# Patient Record
Sex: Male | Born: 1970 | Race: White | Hispanic: No | Marital: Single | Smoking: Current every day smoker
Health system: Southern US, Community
[De-identification: ages and names within clinical notes are randomized; demographics above are authoritative.]

## PROBLEM LIST (undated history)

## (undated) DIAGNOSIS — K759 Inflammatory liver disease, unspecified: Secondary | ICD-10-CM

## (undated) DIAGNOSIS — I82409 Acute embolism and thrombosis of unspecified deep veins of unspecified lower extremity: Secondary | ICD-10-CM

## (undated) DIAGNOSIS — I2699 Other pulmonary embolism without acute cor pulmonale: Secondary | ICD-10-CM

## (undated) DIAGNOSIS — F1020 Alcohol dependence, uncomplicated: Secondary | ICD-10-CM

## (undated) DIAGNOSIS — K7011 Alcoholic hepatitis with ascites: Secondary | ICD-10-CM

## (undated) HISTORY — PX: ANKLE SURGERY: SHX546

## (undated) HISTORY — PX: PARACENTESIS: SHX844

---

## 1997-11-24 ENCOUNTER — Emergency Department (HOSPITAL_COMMUNITY): Admission: EM | Admit: 1997-11-24 | Discharge: 1997-11-24 | Payer: Self-pay | Admitting: Emergency Medicine

## 2003-03-16 ENCOUNTER — Ambulatory Visit (HOSPITAL_BASED_OUTPATIENT_CLINIC_OR_DEPARTMENT_OTHER): Admission: RE | Admit: 2003-03-16 | Discharge: 2003-03-16 | Payer: Self-pay | Admitting: Orthopedic Surgery

## 2007-12-26 ENCOUNTER — Emergency Department (HOSPITAL_COMMUNITY): Admission: EM | Admit: 2007-12-26 | Discharge: 2007-12-26 | Payer: Self-pay | Admitting: Emergency Medicine

## 2010-08-21 NOTE — Op Note (Signed)
NAMEALFONSO, CARDEN               ACCOUNT NO.:  [id number]   MEDICAL RECORD NO.:  [id number]          PATIENT TYPE:  EMS   LOCATION:  MAJO                         FACILITY:  [facility]   PHYSICIAN:  Johnette Abraham, MD    DATE OF BIRTH:  [birth year]   DATE OF PROCEDURE:  12/26/2007  DATE OF DISCHARGE:  12/26/2007                               OPERATIVE REPORT   PREOPERATIVE DIAGNOSIS:  Partial amputation of the right ring finger.   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   PROCEDURE:  Local advancement flap of the right ring finger partial  amputation.   ANESTHESIA:  Local with IV Dilaudid.   COMPLICATIONS:  No acute complications.   INDICATIONS:  Mr. Marietta is a 40 year old gentleman who had a bathtub  fall on his finger sustaining a volar oblique distal amputation.  Risks,  benefits, and alternatives of the surgery were discussed with the  patient and the patient agreed to proceed.   PROCEDURE:  The hand was prepped and draped in normal sterile fashion.  An intrathecal block was used with 1% lidocaine without epinephrine for  a total of 2 mL anesthetizing the tip of the finger.  Irrigation and  debridement of the wound itself was performed.  Skin and subcutaneous  tissue and bone were debrided.  There was a small amount of distal tuft  that was exposed.  Excision of part of the nail plate was performed as  well as some of the distal bone.  Following, the volar tissue was  debulked and the skin was advanced over the bone suturing it to the nail  and adjacent skin.  This was performed with interrupted 5-0 chromic  sutures nicely covering the bone.  Hemostasis was controlled with direct  pressure. Afterwards, the wound was dressed with antibiotic ointment,  Vaseline gauze, and a sterile dressing.  The patient tolerated the  procedure well and was given postoperative instructions about wound care  and followup.      Johnette Abraham, MD  Electronically Signed     HCC/MEDQ  D:  12/26/2007  T:  12/27/2007  Job:  [id number]

## 2010-08-21 NOTE — Consult Note (Signed)
Antonio [name], Antonio [name]               ACCOUNT NO.:  [id number]   MEDICAL RECORD NO.:  [id number]          PATIENT TYPE:  EMS   LOCATION:  MAJO                         FACILITY:  [facility]   PHYSICIAN:  Johnette Abraham, MD    DATE OF BIRTH:  [birth year]   DATE OF CONSULTATION:  12/26/2007  DATE OF DISCHARGE:                                 CONSULTATION   REASON FOR CONSULTATION:  Partial amputation of the right ring finger.   HISTORY OF PRESENT ILLNESS:  Mr. Bechard is a 40 year old gentleman who  was working on a bathtub and the bathtub fell onto to his right ring  finger amputating the distal portion of it.  The patient complained of  pain and bleeding and presented to the ED.  The emergency department  evaluated the patient and I was consulted.   PAST MEDICAL HISTORY:  He denies any medical problems.   Currently, no medications.   Denies any allergies.   Past surgical history -  surgery on his right ankle.   His review of systems is essentially negative.   His social history is positive for ethanol and tobacco.  Denies drug  use.   Family history is noncontributory.   REVIEW OF SYSTEMS:  The patient denies all systems with the exception of  his surgery on his ankle and is current injury.   PHYSICAL EXAMINATION:  VITAL SIGNS: His vitals are reviewed.  GENERAL: He is alert and oriented x3.  RESPIRATORY:  He is in no acute distress.  CARDIOVASCULAR:  His heart rate is regular.  EXTREMITIES: Examination of his right upper extremity, has shoulder  elbow; wrist and other fingers are all within normal limits.  He has a  distal amputation of the right ring finger.  It is a volar oblique  laceration.  It extends from the tip of the nail dorsally and involves  approximally 1-cm proximal of the volar aspect out of his ring finger.  The distal phalanx is exposed.   X-ray examinations reveal a tuft fracture or partial amputation of the  tuft.   PLAN:  The patient has counseled that  there needs to be a soft tissue  covering the bone and that this needs to be performed.  Risks, benefits  and alternatives of the surgery were discussed with the patient.  The  patient agreed to proceed.  The finger will be anesthetized here in the  emergency department.  He is given Dilaudid IV for pain.  He is given IV  antibiotics and a local advancement flap will be performed here.      Johnette Abraham, MD  Electronically Signed     HCC/MEDQ  D:  12/26/2007  T:  12/27/2007  Job:  [id number]

## 2010-08-24 NOTE — Op Note (Signed)
NAMEHERRON, FERO                           ACCOUNT NO.:  [id number]   MEDICAL RECORD NO.:  [id number]                   PATIENT TYPE:  AMB   LOCATION:  DSC                                  FACILITY:  [facility]   PHYSICIAN:  Harvie Junior, M.D.                DATE OF BIRTH:  06/10/1970   DATE OF PROCEDURE:  03/16/2003  DATE OF DISCHARGE:  03/16/2003                                 OPERATIVE REPORT   PREOPERATIVE DIAGNOSIS:  Painful left ankle with degenerative changes in the  ankle and retained painful lateral and medial hardware.   POSTOPERATIVE DIAGNOSIS:  Painful left ankle with degenerative changes in  the ankle and retained painful lateral and medial hardware.   OPERATION PERFORMED:  1. Ankle arthroscopy with debridement of intra-articular degenerative     change.  2. Removal of medial and lateral hardware through separate incisions on the     medial and lateral aspect of the ankle.   SURGEON:  Harvie Junior, M.D.   ASSISTANT:  Marshia Ly, P.A.   ANESTHESIA:  General.   INDICATIONS FOR PROCEDURE:  He is a 40 year old male with a long history of  having had a bimalleolar ankle fracture. He was ultimately treated elsewhere  but continued to have pain and was unable to work.  He was sent up by  vocational rehabilitation for evaluation and treatment.  Injection of the  ankle was performed at that time.  That relieved about 75% of his pain.  It  was our concern at that point that the majority of the pain was coming from  degenerative ankle as opposed to painful hardware.  It was still thought  important to remove hardware given that he still had about 25% of his pain  after ankle injection.  He was brought to the operating room for these  separate procedures.   DESCRIPTION OF PROCEDURE:  The patient was taken to the operating room and  after adequate anesthesia was obtained with general anesthetic, the patient  was placed supine on the operating table.  The left ankle  was prepped and  draped in the usual sterile fashion.  Following this, the leg was  exsanguinated and a blood pressure tourniquet was inflated to 300 mmHg.  Following this, attention was turned to the left ankle where two portals  were established and the camera was placed into the left ankle.  The  degenerative changes were identified on the medial and lateral aspect of the  ankle in particular on the lateral side where there was a fairly significant  osteochondral defect.  This was debrided and drilled with a K-wire to  stimulate healing and vascularity in the bed of the injury.  At this point  attention was turned medially where there was some scar tissue on the medial  thigh.  This was debrided.  Following this, the ankle was copiously  irrigated and suctioned dry.  The portals were closed with interrupted  stitches.  Attention at this time was turned to the lateral aspect of the  ankle where incision was made.  Subcutaneous tissues were dissected down to  the level of the retained hardware and the lateral plate.  The plate was  stripped of all soft tissue attachment  and the plate was then removed,  multiple screws and the plate.  At this point the wound was copiously  irrigated and suctioned dry, closed in layers.  Attention was then turned  medially where the patient had retained medial hardware.  The incision was  made and screws were found.  A screwdriver was used.  The initial screw was  removed without incident.  The second screw was somewhat attached to bone,  did not have reverse cutting threads and in attempted to twist this, it  twisted the head of the screw off.  Happily it was below the level of the  bone and there was no need to get a trephine and take this out as it could  not be causing pain below the level of the bone.  At this point the medial  wound was irrigated, suctioned dry and closed in layers.  A sterile  compressive dressing was applied as well as a U and  posterior splint.  The  patient was then transferred to the recovery room where he was noted to be  in satisfactory condition.  The estimated blood loss for this procedure was  none.                                               Harvie Junior, M.D.    Ranae Plumber  D:  04/28/2003  T:  04/28/2003  Job:  [id number]

## 2011-05-10 DIAGNOSIS — IMO0001 Reserved for inherently not codable concepts without codable children: Secondary | ICD-10-CM

## 2011-05-10 DIAGNOSIS — I2699 Other pulmonary embolism without acute cor pulmonale: Secondary | ICD-10-CM

## 2011-05-10 DIAGNOSIS — I82409 Acute embolism and thrombosis of unspecified deep veins of unspecified lower extremity: Secondary | ICD-10-CM

## 2011-05-10 HISTORY — DX: Reserved for inherently not codable concepts without codable children: IMO0001

## 2011-05-10 HISTORY — DX: Acute embolism and thrombosis of unspecified deep veins of unspecified lower extremity: I82.409

## 2011-05-10 HISTORY — DX: Other pulmonary embolism without acute cor pulmonale: I26.99

## 2011-06-07 HISTORY — PX: LIVER BIOPSY: SHX301

## 2011-06-18 HISTORY — PX: ESOPHAGOGASTRODUODENOSCOPY: SHX1529

## 2011-06-21 ENCOUNTER — Emergency Department (HOSPITAL_COMMUNITY): Payer: Medicaid Other

## 2011-06-21 ENCOUNTER — Inpatient Hospital Stay (HOSPITAL_COMMUNITY)
Admission: EM | Admit: 2011-06-21 | Discharge: 2011-07-01 | DRG: 371 | Disposition: A | Discharge status: Home Health | Payer: Medicaid Other | Attending: Internal Medicine | Admitting: Internal Medicine

## 2011-06-21 ENCOUNTER — Other Ambulatory Visit: Payer: Self-pay

## 2011-06-21 ENCOUNTER — Encounter (HOSPITAL_COMMUNITY): Payer: Self-pay

## 2011-06-21 DIAGNOSIS — K5900 Constipation, unspecified: Secondary | ICD-10-CM | POA: Diagnosis not present

## 2011-06-21 DIAGNOSIS — D649 Anemia, unspecified: Secondary | ICD-10-CM | POA: Diagnosis present

## 2011-06-21 DIAGNOSIS — K219 Gastro-esophageal reflux disease without esophagitis: Secondary | ICD-10-CM | POA: Diagnosis present

## 2011-06-21 DIAGNOSIS — D6859 Other primary thrombophilia: Secondary | ICD-10-CM | POA: Diagnosis present

## 2011-06-21 DIAGNOSIS — E871 Hypo-osmolality and hyponatremia: Secondary | ICD-10-CM | POA: Diagnosis present

## 2011-06-21 DIAGNOSIS — I959 Hypotension, unspecified: Secondary | ICD-10-CM | POA: Diagnosis present

## 2011-06-21 DIAGNOSIS — R6251 Failure to thrive (child): Secondary | ICD-10-CM | POA: Diagnosis present

## 2011-06-21 DIAGNOSIS — IMO0002 Reserved for concepts with insufficient information to code with codable children: Secondary | ICD-10-CM

## 2011-06-21 DIAGNOSIS — K7460 Unspecified cirrhosis of liver: Secondary | ICD-10-CM | POA: Insufficient documentation

## 2011-06-21 DIAGNOSIS — Z86711 Personal history of pulmonary embolism: Secondary | ICD-10-CM

## 2011-06-21 DIAGNOSIS — F17200 Nicotine dependence, unspecified, uncomplicated: Secondary | ICD-10-CM | POA: Diagnosis present

## 2011-06-21 DIAGNOSIS — K652 Spontaneous bacterial peritonitis: Principal | ICD-10-CM | POA: Diagnosis present

## 2011-06-21 DIAGNOSIS — F1020 Alcohol dependence, uncomplicated: Secondary | ICD-10-CM | POA: Diagnosis present

## 2011-06-21 DIAGNOSIS — E43 Unspecified severe protein-calorie malnutrition: Secondary | ICD-10-CM | POA: Diagnosis present

## 2011-06-21 DIAGNOSIS — R188 Other ascites: Secondary | ICD-10-CM | POA: Diagnosis present

## 2011-06-21 DIAGNOSIS — D689 Coagulation defect, unspecified: Secondary | ICD-10-CM | POA: Diagnosis present

## 2011-06-21 DIAGNOSIS — R64 Cachexia: Secondary | ICD-10-CM | POA: Diagnosis present

## 2011-06-21 DIAGNOSIS — R000 Tachycardia, unspecified: Secondary | ICD-10-CM | POA: Diagnosis present

## 2011-06-21 DIAGNOSIS — R627 Adult failure to thrive: Secondary | ICD-10-CM | POA: Diagnosis present

## 2011-06-21 DIAGNOSIS — Z86718 Personal history of other venous thrombosis and embolism: Secondary | ICD-10-CM

## 2011-06-21 DIAGNOSIS — Z79899 Other long term (current) drug therapy: Secondary | ICD-10-CM

## 2011-06-21 HISTORY — DX: Acute embolism and thrombosis of unspecified deep veins of unspecified lower extremity: I82.409

## 2011-06-21 HISTORY — DX: Other pulmonary embolism without acute cor pulmonale: I26.99

## 2011-06-21 HISTORY — DX: Alcohol dependence, uncomplicated: F10.20

## 2011-06-21 HISTORY — DX: Alcoholic hepatitis with ascites: K70.11

## 2011-06-21 LAB — DIFFERENTIAL
Lymphocytes Relative: 11 % — ABNORMAL LOW (ref 12–46)
Lymphs Abs: 1.7 10*3/uL (ref 0.7–4.0)
Neutro Abs: 11.7 10*3/uL — ABNORMAL HIGH (ref 1.7–7.7)
Neutrophils Relative %: 78 % — ABNORMAL HIGH (ref 43–77)

## 2011-06-21 LAB — COMPREHENSIVE METABOLIC PANEL
BUN: 13 mg/dL (ref 6–23)
CO2: 25 mEq/L (ref 19–32)
Calcium: 8.9 mg/dL (ref 8.4–10.5)
Chloride: 94 mEq/L — ABNORMAL LOW (ref 96–112)
Creatinine, Ser: 0.68 mg/dL (ref 0.50–1.35)
GFR calc non Af Amer: >90 mL/min (ref >90)
Total Bilirubin: 0.4 mg/dL (ref 0.3–1.2)

## 2011-06-21 LAB — CBC
Platelets: 320 10*3/uL (ref 150–400)
RBC: 4.38 MIL/uL (ref 4.22–5.81)
WBC: 15 10*3/uL — ABNORMAL HIGH (ref 4.0–10.5)

## 2011-06-21 LAB — URINALYSIS, ROUTINE W REFLEX MICROSCOPIC
Hgb urine dipstick: NEGATIVE
Protein, ur: 30 mg/dL — AB
Urobilinogen, UA: 1 mg/dL (ref 0.0–1.0)

## 2011-06-21 LAB — APTT: aPTT: 40 seconds — ABNORMAL HIGH (ref 24–37)

## 2011-06-21 LAB — LACTIC ACID, PLASMA: Lactic Acid, Venous: 1.6 mmol/L (ref 0.5–2.2)

## 2011-06-21 LAB — URINE MICROSCOPIC-ADD ON

## 2011-06-21 LAB — PROTIME-INR
INR: 1.92 — ABNORMAL HIGH (ref 0.00–1.49)
Prothrombin Time: 22.3 seconds — ABNORMAL HIGH (ref 11.6–15.2)

## 2011-06-21 MED ORDER — FENTANYL CITRATE 0.05 MG/ML IJ SOLN
100.0000 ug | Freq: Once | INTRAMUSCULAR | Status: AC
  Administered 2011-06-21: 100 ug via INTRAVENOUS
  Filled 2011-06-21: qty 2

## 2011-06-21 MED ORDER — SODIUM CHLORIDE 0.9 % IV BOLUS (SEPSIS)
1000.0000 mL | Freq: Once | INTRAVENOUS | Status: AC
  Administered 2011-06-21: 1000 mL via INTRAVENOUS

## 2011-06-21 NOTE — ED Notes (Signed)
Patient presents with generalized weakness, fatigue, SOB, rapid heart rate and chest pain to epigastric area.  Abdominal distention present.

## 2011-06-21 NOTE — ED Notes (Signed)
Antonio [name] brother emergency contact [phone]  [name] sister emergency contact [phone]

## 2011-06-21 NOTE — ED Provider Notes (Signed)
History     CSN: [id number]  Arrival date & time 06/21/11  1807   First MD Initiated Contact with Patient 06/21/11 1821      Chief Complaint  Patient presents with  . Fatigue  . Tachycardia    (Consider location/radiation/quality/duration/timing/severity/associated sxs/prior treatment) HPI Patient presents with generalized weakness, fatigue, SOB, rapid heart rate and chest pain to epigastric area. Abdominal distention present.  Patient recently discharge from hospital [place].  Patient had liver biopsy and was told that he had fluid leaking in his abdomen.  Patient was a former heavy drinker stopped about 12 weeks ago.  Patient's had a 40 pound weight loss over that time.  Patient denies fever chills.  History reviewed. No pertinent past medical history.  Past Surgical History  Procedure Date  . Ankle surgery     History reviewed. No pertinent family history.  History  Substance Use Topics  . Smoking status: Current Everyday Smoker  . Smokeless tobacco: Not on file  . Alcohol Use: No      Review of Systems Review of systems unremarkable except as noted in history of present illness Allergies  Review of patient's allergies indicates no known allergies.  Home Medications   No current outpatient prescriptions on file.  BP 118/89  Pulse 107  Temp(Src) 97.9 F (36.6 C) (Oral)  Resp 16  Ht 6\' 1"  (1.854 m)  Wt 141 lb 1.5 oz (64 kg)  BMI 18.62 kg/m2  SpO2 97%  Physical Exam  Nursing note and vitals reviewed. Constitutional: He is oriented to person, place, and time. He appears cachectic. He appears ill. No distress.  HENT:  Head: Normocephalic and atraumatic.  Eyes: Pupils are equal, round, and reactive to light.  Neck: Normal range of motion.  Cardiovascular: Intact distal pulses.  Tachycardia present.   No murmur heard.       Date: 06/21/2011  Rate: 155  Rhythm: Sinus tachycardia  QRS Axis: normal  Intervals: normal  ST/T Wave abnormalities: normal  Conduction Disutrbances: none  Narrative Interpretation: Sinus tachycardia otherwise unremarkable      Pulmonary/Chest: No respiratory distress. He has no wheezes. He has no rales.  Abdominal: Normal appearance. He exhibits shifting dullness, distension, fluid wave and ascites.       Ascites noted with bedside ultrasound  Musculoskeletal: Normal range of motion.  Neurological: He is alert and oriented to person, place, and time. No cranial nerve deficit.  Skin: Skin is warm and dry. No rash noted.  Psychiatric: He has a normal mood and affect. His behavior is normal.    ED Course  Procedures (including critical care time)  Labs Reviewed  COMPREHENSIVE METABOLIC PANEL - Abnormal; Notable for the following:    Sodium 131 (*)    Chloride 94 (*)    Glucose, Bld 172 (*)    Albumin 2.1 (*)    Alkaline Phosphatase 197 (*)    All other components within normal limits  CBC - Abnormal; Notable for the following:    WBC 15.0 (*)    All other components within normal limits  DIFFERENTIAL - Abnormal; Notable for the following:    Neutrophils Relative 78 (*)    Neutro Abs 11.7 (*)    Lymphocytes Relative 11 (*)    Monocytes Absolute 1.5 (*)    All other components within normal limits  PROTIME-INR - Abnormal; Notable for the following:    Prothrombin Time 22.3 (*)    INR 1.92 (*)    All other components within normal  limits  URINALYSIS, ROUTINE W REFLEX MICROSCOPIC - Abnormal; Notable for the following:    Color, Urine AMBER (*) BIOCHEMICALS MAY BE AFFECTED BY COLOR   APPearance CLOUDY (*)    Specific Gravity, Urine 1.034 (*)    Bilirubin Urine SMALL (*)    Ketones, ur 15 (*)    Protein, ur 30 (*)    Leukocytes, UA TRACE (*)    All other components within normal limits  APTT - Abnormal; Notable for the following:    aPTT 40 (*)    All other components within normal limits  BASIC METABOLIC PANEL - Abnormal; Notable for the following:    Sodium 134 (*)    Glucose, Bld 123 (*)     Calcium 8.3 (*)    All other components within normal limits  CBC - Abnormal; Notable for the following:    RBC 3.66 (*)    Hemoglobin 11.3 (*) DELTA CHECK NOTED   HCT 34.0 (*)    All other components within normal limits  LACTIC ACID, PLASMA  AMMONIA  URINE MICROSCOPIC-ADD ON   Dg Chest Portable 1 View  06/21/2011  *RADIOLOGY REPORT*  Clinical Data: Fatigue, tachycardia, cough  PORTABLE CHEST - 1 VIEW  Comparison: 06/03/2011  Findings: Cardiomediastinal silhouette is stable.  Study is limited by poor inspiration.  Mild right basilar atelectasis. Improvement in aeration in the right lower lobe with decreasing size of the right lateral focal infiltrate measures about 1.2 cm.  Follow-up examination to assure complete resolution is recommended.  No new infiltrate or pulmonary edema.  IMPRESSION:  Study is limited by poor inspiration.  Mild right basilar atelectasis. Improvement in aeration in the right lower lobe with decreasing size of the right lateral focal infiltrate measures about 1.2 cm.  Follow-up examination to assure complete resolution is recommended.  No new infiltrate or pulmonary edema.  Original Report Authenticated By: Natasha Mead, M.D.     1. Cirrhosis   2. Ascites    Scheduled Meds:   . fentaNYL  100 mcg Intravenous Once  . fentaNYL  100 mcg Intravenous Once  . sodium chloride  1,000 mL Intravenous Once  . sodium chloride  3 mL Intravenous Q12H   Continuous Infusions:   . sodium chloride 75 mL/hr at 06/22/11 0200   PRN Meds:.acetaminophen, acetaminophen, alum & mag hydroxide-simeth, HYDROmorphone, ondansetron (ZOFRAN) IV, ondansetron, oxyCODONE, zolpidem    MDM  tachycardia improving with fluids  Will plan on admitting Spoke to the hospitalist about obtaining med records from [facility] Pai, MD 06/22/11 1015

## 2011-06-21 NOTE — ED Notes (Signed)
Pt appears very emaciated and frail.

## 2011-06-22 DIAGNOSIS — R6251 Failure to thrive (child): Secondary | ICD-10-CM | POA: Diagnosis present

## 2011-06-22 DIAGNOSIS — R188 Other ascites: Secondary | ICD-10-CM | POA: Diagnosis present

## 2011-06-22 LAB — CBC
MCH: 30.9 pg (ref 26.0–34.0)
MCV: 92.9 fL (ref 78.0–100.0)
Platelets: 263 10*3/uL (ref 150–400)
RDW: 12.8 % (ref 11.5–15.5)
WBC: 10.3 10*3/uL (ref 4.0–10.5)

## 2011-06-22 LAB — BASIC METABOLIC PANEL
CO2: 28 mEq/L (ref 19–32)
Calcium: 8.3 mg/dL — ABNORMAL LOW (ref 8.4–10.5)
Creatinine, Ser: 0.6 mg/dL (ref 0.50–1.35)

## 2011-06-22 MED ORDER — RIVAROXABAN 15 MG PO TABS: 15.0000 mg | ORAL_TABLET | Freq: Every day | ORAL | Status: DC

## 2011-06-22 MED ORDER — ACETAMINOPHEN 650 MG RE SUPP: 650.0000 mg | Freq: Four times a day (QID) | RECTAL | Status: DC | PRN

## 2011-06-22 MED ORDER — ONDANSETRON HCL 4 MG PO TABS: 4.0000 mg | ORAL_TABLET | Freq: Four times a day (QID) | ORAL | Status: DC | PRN

## 2011-06-22 MED ORDER — OXYCODONE HCL 5 MG PO TABS
5.0000 mg | ORAL_TABLET | ORAL | Status: DC | PRN
  Administered 2011-06-22: 5 mg via ORAL
  Filled 2011-06-22: qty 1

## 2011-06-22 MED ORDER — OXYCODONE HCL 5 MG PO TABS
10.0000 mg | ORAL_TABLET | ORAL | Status: DC | PRN
  Administered 2011-06-22 – 2011-07-01 (×40): 10 mg via ORAL
  Filled 2011-06-22 (×41): qty 2

## 2011-06-22 MED ORDER — DIPHENHYDRAMINE HCL 12.5 MG/5ML PO ELIX
12.5000 mg | ORAL_SOLUTION | Freq: Three times a day (TID) | ORAL | Status: DC | PRN
  Filled 2011-06-22: qty 5

## 2011-06-22 MED ORDER — ENSURE CLINICAL ST REVIGOR PO LIQD
237.0000 mL | Freq: Three times a day (TID) | ORAL | Status: DC
  Administered 2011-06-22 – 2011-06-24 (×3): 237 mL via ORAL

## 2011-06-22 MED ORDER — FOLIC ACID 1 MG PO TABS
1.0000 mg | ORAL_TABLET | Freq: Every day | ORAL | Status: DC
  Administered 2011-06-22 – 2011-07-01 (×10): 1 mg via ORAL
  Filled 2011-06-22 (×10): qty 1

## 2011-06-22 MED ORDER — VITAMIN B-1 100 MG PO TABS
100.0000 mg | ORAL_TABLET | Freq: Every day | ORAL | Status: DC
  Administered 2011-06-22 – 2011-07-01 (×10): 100 mg via ORAL
  Filled 2011-06-22 (×10): qty 1

## 2011-06-22 MED ORDER — PROPRANOLOL HCL 20 MG PO TABS
20.0000 mg | ORAL_TABLET | Freq: Two times a day (BID) | ORAL | Status: DC
  Administered 2011-06-22 – 2011-06-26 (×9): 20 mg via ORAL
  Filled 2011-06-22 (×10): qty 1

## 2011-06-22 MED ORDER — PANTOPRAZOLE SODIUM 40 MG PO TBEC
40.0000 mg | DELAYED_RELEASE_TABLET | Freq: Every day | ORAL | Status: DC
  Administered 2011-06-22 – 2011-06-25 (×4): 40 mg via ORAL
  Filled 2011-06-22 (×5): qty 1

## 2011-06-22 MED ORDER — SODIUM CHLORIDE 0.9 % IJ SOLN
3.0000 mL | Freq: Two times a day (BID) | INTRAMUSCULAR | Status: DC
  Administered 2011-06-22 – 2011-07-01 (×15): 3 mL via INTRAVENOUS

## 2011-06-22 MED ORDER — ALBUMIN HUMAN 25 % IV SOLN
50.0000 g | Freq: Four times a day (QID) | INTRAVENOUS | Status: AC
  Administered 2011-06-22 – 2011-06-23 (×3): 50 g via INTRAVENOUS
  Filled 2011-06-22 (×3): qty 200

## 2011-06-22 MED ORDER — ZOLPIDEM TARTRATE 5 MG PO TABS: 5.0000 mg | ORAL_TABLET | Freq: Every evening | ORAL | Status: DC | PRN

## 2011-06-22 MED ORDER — SODIUM CHLORIDE 0.9 % IV SOLN
INTRAVENOUS | Status: DC
  Administered 2011-06-22: 02:00:00 via INTRAVENOUS
  Administered 2011-06-23: 20 mL via INTRAVENOUS

## 2011-06-22 MED ORDER — HYDROMORPHONE HCL PF 1 MG/ML IJ SOLN
0.5000 mg | INTRAMUSCULAR | Status: DC | PRN
  Administered 2011-06-22 (×4): 1 mg via INTRAVENOUS
  Filled 2011-06-22 (×4): qty 1

## 2011-06-22 MED ORDER — ACETAMINOPHEN 325 MG PO TABS: 650.0000 mg | ORAL_TABLET | Freq: Four times a day (QID) | ORAL | Status: DC | PRN

## 2011-06-22 MED ORDER — ONDANSETRON HCL 4 MG/2ML IJ SOLN
4.0000 mg | Freq: Four times a day (QID) | INTRAMUSCULAR | Status: DC | PRN
  Administered 2011-06-22 – 2011-06-27 (×13): 4 mg via INTRAVENOUS
  Filled 2011-06-22 (×14): qty 2

## 2011-06-22 MED ORDER — ALUM & MAG HYDROXIDE-SIMETH 200-200-20 MG/5ML PO SUSP
30.0000 mL | Freq: Four times a day (QID) | ORAL | Status: DC | PRN
  Administered 2011-06-30: 30 mL via ORAL
  Filled 2011-06-22: qty 30

## 2011-06-22 MED ORDER — ENOXAPARIN SODIUM 60 MG/0.6ML ~~LOC~~ SOLN
60.0000 mg | Freq: Two times a day (BID) | SUBCUTANEOUS | Status: DC
  Administered 2011-06-22 – 2011-06-30 (×15): 60 mg via SUBCUTANEOUS
  Filled 2011-06-22 (×20): qty 0.6

## 2011-06-22 NOTE — ED Notes (Signed)
Called to give report.  The patient's room is not ready.  Receiving RN to call back when room is ready.

## 2011-06-22 NOTE — Progress Notes (Signed)
ANTICOAGULATION CONSULT NOTE - Initial Consult  Pharmacy Consult for Lovenox Indication: recent bilateral PE  No Known Allergies  Patient Measurements: Height: 6\' 1"  (185.4 cm) Weight: 141 lb 1.5 oz (64 kg) IBW/kg (Calculated) : 79.9   Vital Signs: Temp: 98.2 F (36.8 C) (03/16 1500) Temp src: Oral (03/16 1500) BP: 122/97 mmHg (03/16 1500) Pulse Rate: 118  (03/16 1500)  Labs:  Basename 06/22/11 0500 06/21/11 1828  HGB 11.3* 13.7  HCT 34.0* 40.5  PLT 263 320  APTT -- 40*  LABPROT -- 22.3*  INR -- 1.92*  HEPARINUNFRC -- --  CREATININE 0.60 0.68  CKTOTAL -- --  CKMB -- --  TROPONINI -- --   Estimated Creatinine Clearance: 113.3 ml/min (by C-G formula based on Cr of 0.6).  Assessment:   Recent admission at [facility]. Records not yet available. Patient reports discharge on 3/14. Has Rx vial for Xarelto 15 mg BID x 21 days.  Last taken 3/15 am.  New bilateral PE  while at [facility]. Now to change to Lovenox.     Goal of Therapy:   full anticoagulation   Plan:    Will begin Lovenox 60 mg SQ q12hrs.   CBC every other day for now.  Dennie Fetters, [place] Pager:  [phone] 06/22/2011,3:53 PM

## 2011-06-22 NOTE — H&P (Addendum)
DATE OF ADMISSION:  06/22/2011  PCP:   No primary provider on file.   Chief Complaint: weakness   HPI: Antonio [name] is an 42 y.o. male with a long history of Alcohol abuse and recent hospitalization at [facility] for Ascites and Liver disease who presents to the [facility] ED for evaluation due to complaints of increased weakness and falls and for the past 2 days following his discharge from [facility].  He reports having 14 liters of fluid removed from his Abdomen, and having a liver biopsy.  But he is unaware of the conclusions.   He reports that he has a 40 pound unintentional weight loss over the past month.  He also reports having a poor appetite and having increased nausea and vomiting, he denies diarrhea, fevers or chills.  He also states that he stopped drinking alcohol 12 weeks ago.    In the ED he was found to have tachycardia, and mild hypotension.   His tachycardia and blood pressure did respond to fluid challenges.  He was referred for medical admission.   History reviewed. No pertinent past medical history.  Past Surgical History  Procedure Date  . Ankle surgery     Medications:  HOME MEDS: Prior to Admission medications   Medication Sig Start Date End Date Taking? Authorizing Provider  omeprazole (PRILOSEC) 20 MG capsule Take 20 mg by mouth 2 (two) times daily.   Yes Historical Provider, MD  oxyCODONE (OXY IR/ROXICODONE) 5 MG immediate release tablet Take 5 mg by mouth every 4 (four) hours as needed.   Yes Historical Provider, MD  prenatal vitamin w/FE, FA (PRENATAL 1 + 1) 27-1 MG TABS Take 1 tablet by mouth daily.   Yes Historical Provider, MD  promethazine (PHENERGAN) 12.5 MG tablet Take 12.5 mg by mouth every 6 (six) hours as needed. For nausea   Yes Historical Provider, MD  Rivaroxaban (XARELTO) 15 MG TABS tablet Take 15 mg by mouth daily.   Yes Historical Provider, MD  spironolactone (ALDACTONE) 25 MG tablet Take 25 mg by mouth 2 (two) times daily.   Yes  Historical Provider, MD    Allergies:  No Known Allergies  Social History:   reports that he has been smoking.  He does not have any smokeless tobacco history on file. He reports that he does not drink alcohol or use illicit drugs.  Family History: History reviewed. No pertinent family history.   Review of Systems:  Positive for anorexia, weight loss, dyspnea on exertion,  abdominal pain, unusual weight change, muscle weakness, and peripheral edema,  The patient denies fever, vision loss, decreased hearing, hoarseness, chest pain, syncope,   balance deficits, hemoptysis,melena, hematochezia, severe indigestion/heartburn, hematuria, incontinence, genital sores,  suspicious skin lesions, transient blindness, difficulty walking, depression, abnormal bleeding, enlarged lymph nodes, angioedema, and breast masses.   Physical Exam:  GEN:  Pleasant  Cachectic appearing 41 year old Caucasian male examined  and in no acute distress; cooperative with exam Filed Vitals:   06/22/11 0000 06/22/11 0145 06/22/11 0600 06/22/11 0800  BP: 119/95 125/96 110/83 118/89  Pulse: 120 122 111 107  Temp:  97.6 F (36.4 C) 97.5 F (36.4 C) 97.9 F (36.6 C)  TempSrc:    Oral  Resp: 21 20 14 16   Height:  6\' 1"  (1.854 m)    Weight:  64 kg (141 lb 1.5 oz)    SpO2: 97% 100% 99% 97%   Blood pressure 118/89, pulse 107, temperature 97.9 F (36.6 C), temperature source  Oral, resp. rate 16, height 6\' 1"  (1.854 m), weight 64 kg (141 lb 1.5 oz), SpO2 97.00%. PSYCH: He is alert and oriented x4; does not appear anxious does not appear depressed; affect is normal HEENT: Normocephalic and Atraumatic, Mucous membranes pink; PERRLA; EOM intact; Fundi:  Benign;  No scleral icterus, Nares: Patent, Oropharynx: POOR Dentition, Neck:  FROM, no cervical lymphadenopathy nor thyromegaly or carotid bruit; no JVD; Breasts:: Not examined CHEST WALL: No tenderness CHEST: Normal respiration, clear to auscultation bilaterally HEART:  Regular rate and rhythm; no murmurs rubs or gallops BACK: No kyphosis or scoliosis; no CVA tenderness ABDOMEN:  Mild CAPUT MEDUSA, Distant but Positive Bowel Sounds, Distended, firm non-tender; no masses, no organomegaly. Rectal Exam: Not done EXTREMITIES: No bone or joint deformity; age-appropriate arthropathy of the hands and knees; no cyanosis, clubbing or edema; no ulcerations. Genitalia: not examined PULSES: 2+ and symmetric SKIN: Normal hydration no rash or ulceration CNS: Cranial nerves 2-12 grossly intact no focal neurologic deficit   Labs & Imaging Results for orders placed during the hospital encounter of 06/21/11 (from the past 48 hour(s))  COMPREHENSIVE METABOLIC PANEL     Status: Abnormal   Collection Time   06/21/11  6:28 PM      Component Value Range Comment   Sodium 131 (*) 135 - 145 (mEq/L)    Potassium 4.5  3.5 - 5.1 (mEq/L)    Chloride 94 (*) 96 - 112 (mEq/L)    CO2 25  19 - 32 (mEq/L)    Glucose, Bld 172 (*) 70 - 99 (mg/dL)    BUN 13  6 - 23 (mg/dL)    Creatinine, Ser 1.61  0.50 - 1.35 (mg/dL)    Calcium 8.9  8.4 - 10.5 (mg/dL)    Total Protein 6.0  6.0 - 8.3 (g/dL)    Albumin 2.1 (*) 3.5 - 5.2 (g/dL)    AST 32  0 - 37 (U/L)    ALT 21  0 - 53 (U/L)    Alkaline Phosphatase 197 (*) 39 - 117 (U/L)    Total Bilirubin 0.4  0.3 - 1.2 (mg/dL)    GFR calc non Af Amer >90  >90 (mL/min)    GFR calc Af Amer >90  >90 (mL/min)   CBC     Status: Abnormal   Collection Time   06/21/11  6:28 PM      Component Value Range Comment   WBC 15.0 (*) 4.0 - 10.5 (K/uL)    RBC 4.38  4.22 - 5.81 (MIL/uL)    Hemoglobin 13.7  13.0 - 17.0 (g/dL)    HCT 09.6  04.5 - 40.9 (%)    MCV 92.5  78.0 - 100.0 (fL)    MCH 31.3  26.0 - 34.0 (pg)    MCHC 33.8  30.0 - 36.0 (g/dL)    RDW 81.1  91.4 - 78.2 (%)    Platelets 320  150 - 400 (K/uL)   DIFFERENTIAL     Status: Abnormal   Collection Time   06/21/11  6:28 PM      Component Value Range Comment   Neutrophils Relative 78 (*) 43 - 77 (%)     Neutro Abs 11.7 (*) 1.7 - 7.7 (K/uL)    Lymphocytes Relative 11 (*) 12 - 46 (%)    Lymphs Abs 1.7  0.7 - 4.0 (K/uL)    Monocytes Relative 10  3 - 12 (%)    Monocytes Absolute 1.5 (*) 0.1 - 1.0 (K/uL)    Eosinophils  Relative 1  0 - 5 (%)    Eosinophils Absolute 0.1  0.0 - 0.7 (K/uL)    Basophils Relative 0  0 - 1 (%)    Basophils Absolute 0.0  0.0 - 0.1 (K/uL)   PROTIME-INR     Status: Abnormal   Collection Time   06/21/11  6:28 PM      Component Value Range Comment   Prothrombin Time 22.3 (*) 11.6 - 15.2 (seconds)    INR 1.92 (*) 0.00 - 1.49    APTT     Status: Abnormal   Collection Time   06/21/11  6:28 PM      Component Value Range Comment   aPTT 40 (*) 24 - 37 (seconds)   LACTIC ACID, PLASMA     Status: Normal   Collection Time   06/21/11  6:54 PM      Component Value Range Comment   Lactic Acid, Venous 1.6  0.5 - 2.2 (mmol/L)   AMMONIA     Status: Normal   Collection Time   06/21/11  6:56 PM      Component Value Range Comment   Ammonia 18  11 - 60 (umol/L)   URINALYSIS, ROUTINE W REFLEX MICROSCOPIC     Status: Abnormal   Collection Time   06/21/11  7:42 PM      Component Value Range Comment   Color, Urine AMBER (*) YELLOW  BIOCHEMICALS MAY BE AFFECTED BY COLOR   APPearance CLOUDY (*) CLEAR     Specific Gravity, Urine 1.034 (*) 1.005 - 1.030     pH 6.0  5.0 - 8.0     Glucose, UA NEGATIVE  NEGATIVE (mg/dL)    Hgb urine dipstick NEGATIVE  NEGATIVE     Bilirubin Urine SMALL (*) NEGATIVE     Ketones, ur 15 (*) NEGATIVE (mg/dL)    Protein, ur 30 (*) NEGATIVE (mg/dL)    Urobilinogen, UA 1.0  0.0 - 1.0 (mg/dL)    Nitrite NEGATIVE  NEGATIVE     Leukocytes, UA TRACE (*) NEGATIVE    URINE MICROSCOPIC-ADD ON     Status: Normal   Collection Time   06/21/11  7:42 PM      Component Value Range Comment   Squamous Epithelial / LPF RARE  RARE     WBC, UA 3-6  <3 (WBC/hpf)    Bacteria, UA RARE  RARE     Urine-Other MUCOUS PRESENT   AMORPHOUS URATES/PHOSPHATES  BASIC METABOLIC PANEL      Status: Abnormal   Collection Time   06/22/11  5:00 AM      Component Value Range Comment   Sodium 134 (*) 135 - 145 (mEq/L)    Potassium 4.6  3.5 - 5.1 (mEq/L)    Chloride 98  96 - 112 (mEq/L)    CO2 28  19 - 32 (mEq/L)    Glucose, Bld 123 (*) 70 - 99 (mg/dL)    BUN 11  6 - 23 (mg/dL)    Creatinine, Ser 4.09  0.50 - 1.35 (mg/dL)    Calcium 8.3 (*) 8.4 - 10.5 (mg/dL)    GFR calc non Af Amer >90  >90 (mL/min)    GFR calc Af Amer >90  >90 (mL/min)   CBC     Status: Abnormal   Collection Time   06/22/11  5:00 AM      Component Value Range Comment   WBC 10.3  4.0 - 10.5 (K/uL)    RBC 3.66 (*) 4.22 - 5.81 (  MIL/uL)    Hemoglobin 11.3 (*) 13.0 - 17.0 (g/dL) DELTA CHECK NOTED   HCT 34.0 (*) 39.0 - 52.0 (%)    MCV 92.9  78.0 - 100.0 (fL)    MCH 30.9  26.0 - 34.0 (pg)    MCHC 33.2  30.0 - 36.0 (g/dL)    RDW 16.1  09.6 - 04.5 (%)    Platelets 263  150 - 400 (K/uL)    Dg Chest Portable 1 View  06/21/2011  *RADIOLOGY REPORT*  Clinical Data: Fatigue, tachycardia, cough  PORTABLE CHEST - 1 VIEW  Comparison: 06/03/2011  Findings: Cardiomediastinal silhouette is stable.  Study is limited by poor inspiration.  Mild right basilar atelectasis. Improvement in aeration in the right lower lobe with decreasing size of the right lateral focal infiltrate measures about 1.2 cm.  Follow-up examination to assure complete resolution is recommended.  No new infiltrate or pulmonary edema.  IMPRESSION:  Study is limited by poor inspiration.  Mild right basilar atelectasis. Improvement in aeration in the right lower lobe with decreasing size of the right lateral focal infiltrate measures about 1.2 cm.  Follow-up examination to assure complete resolution is recommended.  No new infiltrate or pulmonary edema.  Original Report Authenticated By: Natasha Mead, M.D.      Assessment: Present on Admission:  .Tachycardia .Cirrhosis .Coagulopathy .Cachexia .Hyponatremia .Failure to thrive Generalized Weakness  Plan:      Admitted to Telemetry Bed.   Gentle IVFs, Monitor sodium level and electrolytes. Lengthy discussion held with patient regarding his disease process, and his medical problems which are sequelae of Alcohol induced Cirrhosis.  Monitor LFTS, currently only mildly increased.   Reconcile meds DVT prophlyaxis Request medical Records from [facility]. Other plans as per orders.    CODE STATUS:      FULL CODE         Gardiner Espana C 06/22/2011, 8:34 AM

## 2011-06-22 NOTE — Progress Notes (Signed)
Subjective: No CP, no SOB; feeling dizzy still. Denies any nausea or vomiting. Afebrile.  Objective: Vital signs in last 24 hours: Temp:  [97.5 F (36.4 C)-98.3 F (36.8 C)] 98.2 F (36.8 C) (03/16 1500) Pulse Rate:  [107-154] 118  (03/16 1500) Resp:  [14-32] 18  (03/16 1500) BP: (103-128)/(83-102) 122/97 mmHg (03/16 1500) SpO2:  [96 %-100 %] 98 % (03/16 1500) Weight:  [64 kg (141 lb 1.5 oz)] 64 kg (141 lb 1.5 oz) (03/16 0145) Weight change:  Last BM Date: 06/21/11  Intake/Output from previous day: 03/15 0701 - 03/16 0700 In: 240 [P.O.:240] Out: -  Total I/O In: 363 [P.O.:360; I.V.:3] Out: -    Physical Exam: General: Alert, awake, oriented x3, in no acute distress. cachetic HEENT: No bruits, no goiter. Heart: Regular rate and rhythm, without murmurs, rubs, gallops. Lungs: Clear to auscultation bilaterally. Abdomen: Soft, nontender,  Mild distension, positive bowel sounds. Extremities: No clubbing, cyanosis or edema with positive pedal pulses. Neuro: Grossly intact, nonfocal.  Lab Results: Basic Metabolic Panel:  Basename 06/22/11 0500 06/21/11 1828  NA 134* 131*  K 4.6 4.5  CL 98 94*  CO2 28 25  GLUCOSE 123* 172*  BUN 11 13  CREATININE 0.60 0.68  CALCIUM 8.3* 8.9  MG -- --  PHOS -- --   Liver Function Tests:  [facility] 06/21/11 1828  AST 32  ALT 21  ALKPHOS 197*  BILITOT 0.4  PROT 6.0  ALBUMIN 2.1*    Basename 06/21/11 1856  AMMONIA 18   CBC:  Basename 06/22/11 0500 06/21/11 1828  WBC 10.3 15.0*  NEUTROABS -- 11.7*  HGB 11.3* 13.7  HCT 34.0* 40.5  MCV 92.9 92.5  PLT 263 320   Coagulation:  Basename 06/21/11 1828  LABPROT 22.3*  INR 1.92*   Urinalysis:  Basename 06/21/11 1942  COLORURINE AMBER*  LABSPEC 1.034*  PHURINE 6.0  GLUCOSEU NEGATIVE  HGBUR NEGATIVE  BILIRUBINUR SMALL*  KETONESUR 15*  PROTEINUR 30*  UROBILINOGEN 1.0  NITRITE NEGATIVE  LEUKOCYTESUR TRACE*    Studies/Results: Dg Chest Portable 1 View  06/21/2011   *RADIOLOGY REPORT*  Clinical Data: Fatigue, tachycardia, cough  PORTABLE CHEST - 1 VIEW  Comparison: 06/03/2011  Findings: Cardiomediastinal silhouette is stable.  Study is limited by poor inspiration.  Mild right basilar atelectasis. Improvement in aeration in the right lower lobe with decreasing size of the right lateral focal infiltrate measures about 1.2 cm.  Follow-up examination to assure complete resolution is recommended.  No new infiltrate or pulmonary edema.  IMPRESSION:  Study is limited by poor inspiration.  Mild right basilar atelectasis. Improvement in aeration in the right lower lobe with decreasing size of the right lateral focal infiltrate measures about 1.2 cm.  Follow-up examination to assure complete resolution is recommended.  No new infiltrate or pulmonary edema.  Original Report Authenticated By: Natasha Mead, M.D.    Medications: Scheduled Meds:   . albumin human  50 g Intravenous Q6H  . fentaNYL  100 mcg Intravenous Once  . fentaNYL  100 mcg Intravenous Once  . folic acid  1 mg Oral Daily  . propranolol  20 mg Oral BID  . sodium chloride  1,000 mL Intravenous Once  . sodium chloride  3 mL Intravenous Q12H  . thiamine  100 mg Oral Daily  . DISCONTD: Rivaroxaban  15 mg Oral Daily   Continuous Infusions:   . sodium chloride 75 mL/hr at 06/22/11 0200   PRN Meds:.acetaminophen, acetaminophen, alum & mag hydroxide-simeth, ondansetron (ZOFRAN) IV, ondansetron,  oxyCODONE, zolpidem, DISCONTD: HYDROmorphone  Assessment/Plan: 1-Tachycardia: Most likely secondary to massive paracentesis done during recent hospitalization around the hospital. Will continue gentle hydration. Will provide 3 doses of albumin 50 g. Will also start patient on propranolol to help controlling portal hypertension and also tachycardia. Will hold spironolactone for now.  2-Cirrhosis: Records from [facility] not available at this point; the patient even had a liver biopsy during that admission. Will  wait for results in order to determine what further workup is indicated. Patient reports that he is not drinking anymore. Patient cirrhosis suspected to be secondary to alcohol abuse.  3-Coagulopathy: Most likely associated to patient Lupus diagnosis. Had a history of 4 blood clots in the past and was also felt to prior coming to the hospital. At this moment will start him on Lovenox.  4-Cachexia: will start ensure TID and get nutrition consult.  5-Hyponatremia: secondary to diuretics. Will provide gentle hydration.  6-Ascites: Stable at this moment. Will provide albumin and follow close I's and O's.  7-GERD: Protonix.   LOS: 1 day   Hassel Uphoff [name] [phone]  06/22/2011, 3:28 PM

## 2011-06-23 LAB — BASIC METABOLIC PANEL
GFR calc Af Amer: >90 mL/min (ref >90)
GFR calc non Af Amer: >90 mL/min (ref >90)
Potassium: 4.1 mEq/L (ref 3.5–5.1)
Sodium: 137 mEq/L (ref 135–145)

## 2011-06-23 LAB — CBC
MCHC: 33 g/dL (ref 30.0–36.0)
RDW: 13 % (ref 11.5–15.5)

## 2011-06-23 NOTE — Progress Notes (Signed)
Subjective: No CP, no SOB; feeling better today and reporting good pain control with PO regimen.  Denies any nausea or vomiting. Afebrile.   Objective: Vital signs in last 24 hours: Temp:  [97.7 F (36.5 C)-98.4 F (36.9 C)] 98 F (36.7 C) (03/17 1600) Pulse Rate:  [89-105] 101  (03/17 1600) Resp:  [14-20] 16  (03/17 1600) BP: (106-127)/(85-92) 127/92 mmHg (03/17 1600) SpO2:  [95 %-98 %] 95 % (03/17 1600) Weight:  [66.9 kg (147 lb 7.8 oz)] 66.9 kg (147 lb 7.8 oz) (03/17 0545) Weight change: 2.9 kg (6 lb 6.3 oz) Last BM Date: 06/20/11  Intake/Output from previous day: 03/16 0701 - 03/17 0700 In: 363 [P.O.:360; I.V.:3] Out: 200 [Urine:200] Total I/O In: 243 [P.O.:240; I.V.:3] Out: -    Physical Exam: General: Alert, awake, oriented x3, in no acute distress. cachetic HEENT: No bruits, no goiter. Heart: Regular rate and rhythm, without murmurs, rubs, gallops. Lungs: Clear to auscultation bilaterally. Abdomen: distended, tender to palpation, positive bowel sounds; spider stigmata appreciated Extremities: No clubbing or cyanosis; trace edema. Neuro: Grossly intact, nonfocal.  Lab Results: Basic Metabolic Panel:  Basename 06/23/11 0612 06/22/11 0500  NA 137 134*  K 4.1 4.6  CL 103 98  CO2 28 28  GLUCOSE 109* 123*  BUN 11 11  CREATININE 0.52 0.60  CALCIUM 8.6 8.3*  MG -- --  PHOS -- --   Liver Function Tests:  [facility] 06/21/11 1828  AST 32  ALT 21  ALKPHOS 197*  BILITOT 0.4  PROT 6.0  ALBUMIN 2.1*    Basename 06/21/11 1856  AMMONIA 18   CBC:  Basename 06/23/11 0612 06/22/11 0500 06/21/11 1828  WBC 8.1 10.3 --  NEUTROABS -- -- 11.7*  HGB 9.6* 11.3* --  HCT 29.1* 34.0* --  MCV 93.6 92.9 --  PLT 225 263 --   Coagulation:  Basename 06/21/11 1828  LABPROT 22.3*  INR 1.92*   Urinalysis:  Basename 06/21/11 1942  COLORURINE AMBER*  LABSPEC 1.034*  PHURINE 6.0  GLUCOSEU NEGATIVE  HGBUR NEGATIVE  BILIRUBINUR SMALL*  KETONESUR 15*  PROTEINUR 30*   UROBILINOGEN 1.0  NITRITE NEGATIVE  LEUKOCYTESUR TRACE*    Studies/Results: Dg Chest Portable 1 View  06/21/2011  *RADIOLOGY REPORT*  Clinical Data: Fatigue, tachycardia, cough  PORTABLE CHEST - 1 VIEW  Comparison: 06/03/2011  Findings: Cardiomediastinal silhouette is stable.  Study is limited by poor inspiration.  Mild right basilar atelectasis. Improvement in aeration in the right lower lobe with decreasing size of the right lateral focal infiltrate measures about 1.2 cm.  Follow-up examination to assure complete resolution is recommended.  No new infiltrate or pulmonary edema.  IMPRESSION:  Study is limited by poor inspiration.  Mild right basilar atelectasis. Improvement in aeration in the right lower lobe with decreasing size of the right lateral focal infiltrate measures about 1.2 cm.  Follow-up examination to assure complete resolution is recommended.  No new infiltrate or pulmonary edema.  Original Report Authenticated By: Natasha Mead, M.D.    Medications: Scheduled Meds:    . albumin human  50 g Intravenous Q6H  . enoxaparin (LOVENOX) injection  60 mg Subcutaneous Q12H  . feeding supplement  237 mL Oral TID WC  . folic acid  1 mg Oral Daily  . pantoprazole  40 mg Oral Q1200  . propranolol  20 mg Oral BID  . sodium chloride  3 mL Intravenous Q12H  . thiamine  100 mg Oral Daily   Continuous Infusions:    . sodium  chloride 75 mL/hr at 06/22/11 0200   PRN Meds:.acetaminophen, acetaminophen, alum & mag hydroxide-simeth, diphenhydrAMINE, ondansetron (ZOFRAN) IV, ondansetron, oxyCODONE, zolpidem, DISCONTD: oxyCODONE  Assessment/Plan: 1-Tachycardia: improved. Will adjust IVF's.  2-Cirrhosis: Records from [facility] still pending; will wait for results and records in order to determine what further workup is needed. Patient cirrhosis suspected to be secondary to alcohol abuse.  3-Coagulopathy: Most likely associated to patient Lupus diagnosis. Continue  lovenox.  4-Cachexia: continue ensure TID and follow nutrition consult rec's.  5-Hyponatremia: Resolved with IVF's. Will adjust IVF rate.  6-Ascites: Stable at this moment. Follow close I's and O's; low sodium diet. Will restart spironolactone and low dose lasix in am.  7-GERD: continue Protonix.   LOS: 2 days   Zerick Prevette [name] [phone]  06/23/2011, 5:38 PM

## 2011-06-24 LAB — CBC
HCT: 30.7 % — ABNORMAL LOW (ref 39.0–52.0)
Hemoglobin: 10.1 g/dL — ABNORMAL LOW (ref 13.0–17.0)
MCHC: 32.9 g/dL (ref 30.0–36.0)
RBC: 3.26 MIL/uL — ABNORMAL LOW (ref 4.22–5.81)

## 2011-06-24 LAB — PROTIME-INR: INR: 1.27 (ref 0.00–1.49)

## 2011-06-24 LAB — BASIC METABOLIC PANEL
Chloride: 98 mEq/L (ref 96–112)
GFR calc Af Amer: >90 mL/min (ref >90)
Potassium: 4.1 mEq/L (ref 3.5–5.1)
Sodium: 134 mEq/L — ABNORMAL LOW (ref 135–145)

## 2011-06-24 MED ORDER — FUROSEMIDE 20 MG PO TABS
20.0000 mg | ORAL_TABLET | Freq: Every day | ORAL | Status: DC
  Administered 2011-06-24 – 2011-06-26 (×3): 20 mg via ORAL
  Filled 2011-06-24 (×3): qty 1

## 2011-06-24 MED ORDER — SPIRONOLACTONE 25 MG PO TABS
25.0000 mg | ORAL_TABLET | Freq: Every day | ORAL | Status: DC
  Administered 2011-06-24 – 2011-06-26 (×3): 25 mg via ORAL
  Filled 2011-06-24 (×3): qty 1

## 2011-06-24 NOTE — Progress Notes (Signed)
Clinical Social Worker received a phone call from [place] with [employer] in the Patient Benefits department. Patient has a scheduled social security disability interview tomorrow at 11:00am. CSW spoke with patient to confirm if he will be able to do a phone interview and he expressed that he does get out of breath when he speaks. Patient did not want to re-schedule the phone interview and is willing to take the call. CSW notified nurse that patient will need to be in his room during this time to take the call. CSW will sign off as social work intervention is no longer needed. Please consult [place] again if new needs arises.   Rozetta Nunnery MSW, [employer] [phone]

## 2011-06-24 NOTE — Progress Notes (Signed)
ANTICOAGULATION CONSULT NOTE - Initial Consult  Pharmacy Consult for Lovenox Indication: recent bilateral PE  No Known Allergies  Patient Measurements: Height: 6\' 1"  (185.4 cm) Weight: 147 lb 7.8 oz (66.9 kg) IBW/kg (Calculated) : 79.9   Vital Signs: Temp: 98.1 F (36.7 C) (03/18 0600) BP: 120/90 mmHg (03/18 0600) Pulse Rate: 97  (03/18 0600)  Labs:  Basename 06/24/11 0525 06/23/11 0612 06/22/11 0500 06/21/11 1828  HGB 10.1* 9.6* -- --  HCT 30.7* 29.1* 34.0* --  PLT 262 225 263 --  APTT -- -- -- 40*  LABPROT -- -- -- 22.3*  INR -- -- -- 1.92*  HEPARINUNFRC -- -- -- --  CREATININE 0.49* 0.52 0.60 --  CKTOTAL -- -- -- --  CKMB -- -- -- --  TROPONINI -- -- -- --   Estimated Creatinine Clearance: 118.5 ml/min (by C-G formula based on Cr of 0.49).  Assessment: Recent admission at [facility]. Patient reports discharge on 3/14. Has Rx vial for Xarelto 15 mg BID x 21 days.  Last taken 3/15 am.  New bilateral PE  while at [facility]. Now to change Xarelto to Lovenox 3/16.     ? New lupus dx, lupus anticoagulant?  Cardiovascular -Max BP 127/92 with HR 89-110. Low dose Inderal added 3/16. Tachycardia, thought d/t portal HTN. Massive paracentesis done at [facility] per MD note.  Endocrinology - not diabetic.  Gastrointestinal / Nutrition - Hx ETOH, cirrhosis, ascites. Liver bx recent at [facility]. No longer drinking. Had paracentesis at [facility]. Unintentional 40-lb weight loss in last several months. Off home MVI, on Thiamine/folate, PPI  Neurology - Oxy for pain, pt asked me for IV Dilaudid 3/16, as he had in ED. D/w Dr. Gwenlyn Perking, who said no. He thought Dilaudid was decreasing BP. Pain control seems improved.  Nephrology- new SLE dx? CrCl>100.  Goal of Therapy:   full anticoagulation   Plan:  Lovenox 60 mg SQ q12hrs.  CBC every other day for now. What are the long-term anticoag plans? Xarelto vs Coumadin?  Pasty Spillers, PharmD 06/24/2011,10:07 AM

## 2011-06-24 NOTE — Progress Notes (Signed)
   CARE MANAGEMENT NOTE 06/24/2011  Patient:  Antonio [name], Antonio [name]   Account Number:  [id number]  Date Initiated:  06/24/2011  Documentation initiated by:  Junius Creamer  Subjective/Objective Assessment:   adm w tachycardia     Action/Plan:   lives w fam(brother), no pcp listed. pt from [employer].   Anticipated DC Date:  06/26/2011   Anticipated DC Plan:  HOME W HOME HEALTH SERVICES  In-house referral  Clinical Social Worker      DC Planning Services  CM consult      [facility] Choice  Resumption Of Svcs/PTA Provider   Choice offered to / List presented to:          [facility] arranged  HH-1 RN      [facility] agency  [facility] HEALTH   Status of service:   Medicare Important Message given?   (If response is "NO", the following Medicare IM given date fields will be blank) Date Medicare IM given:   Date Additional Medicare IM given:    Discharge Disposition:  HOME W HOME HEALTH SERVICES  Per UR Regulation:    If discussed at Long Length of Stay Meetings, dates discussed:    Comments:  3/18 sw consult for etoh co.  spoke w pt and friends. was act w [facility] home health pta. debbie Briana Newman rn,bsn [id number]

## 2011-06-24 NOTE — Progress Notes (Signed)
Subjective: No CP, mild SOB (per patient reports; associated with increased swelling on his belly); reports some discomfort on his belly as well. Afebrile.   Objective: Vital signs in last 24 hours: Temp:  [98 F (36.7 C)-98.2 F (36.8 C)] 98.1 F (36.7 C) (03/18 0600) Pulse Rate:  [89-112] 112  (03/18 1028) Resp:  [14-18] 18  (03/18 0600) BP: (115-127)/(85-92) 120/90 mmHg (03/18 1028) SpO2:  [95 %-99 %] 97 % (03/18 0600) Weight change:  Last BM Date: 06/20/11  Intake/Output from previous day: 03/17 0701 - 03/18 0700 In: 243 [P.O.:240; I.V.:3] Out: 200 [Urine:200] Total I/O In: 243 [P.O.:240; I.V.:3] Out: -    Physical Exam: General: Alert, awake, oriented x3, in no acute distress. cachetic HEENT: No bruits, no goiter. Heart: Regular rate and rhythm, without murmurs, rubs, gallops. Lungs: Clear to auscultation bilaterally. Abdomen: distended, tender to palpation, positive bowel sounds; spider stigmata appreciated Extremities: No clubbing or cyanosis; trace edema. Neuro: Grossly intact, nonfocal.  Lab Results: Basic Metabolic Panel:  Basename 06/24/11 0525 06/23/11 0612  NA 134* 137  K 4.1 4.1  CL 98 103  CO2 27 28  GLUCOSE 114* 109*  BUN 10 11  CREATININE 0.49* 0.52  CALCIUM 8.5 8.6  MG -- --  PHOS -- --   Liver Function Tests:  [facility] 06/21/11 1828  AST 32  ALT 21  ALKPHOS 197*  BILITOT 0.4  PROT 6.0  ALBUMIN 2.1*    Basename 06/21/11 1856  AMMONIA 18   CBC:  Basename 06/24/11 0525 06/23/11 0612 06/21/11 1828  WBC 8.3 8.1 --  NEUTROABS -- -- 11.7*  HGB 10.1* 9.6* --  HCT 30.7* 29.1* --  MCV 94.2 93.6 --  PLT 262 225 --   Coagulation:  Basename 06/21/11 1828  LABPROT 22.3*  INR 1.92*   Urinalysis:  Basename 06/21/11 1942  COLORURINE AMBER*  LABSPEC 1.034*  PHURINE 6.0  GLUCOSEU NEGATIVE  HGBUR NEGATIVE  BILIRUBINUR SMALL*  KETONESUR 15*  PROTEINUR 30*  UROBILINOGEN 1.0  NITRITE NEGATIVE  LEUKOCYTESUR TRACE*     Studies/Results: No results found.  Medications: Scheduled Meds:    . enoxaparin (LOVENOX) injection  60 mg Subcutaneous Q12H  . feeding supplement  237 mL Oral TID WC  . folic acid  1 mg Oral Daily  . furosemide  20 mg Oral Daily  . pantoprazole  40 mg Oral Q1200  . propranolol  20 mg Oral BID  . sodium chloride  3 mL Intravenous Q12H  . spironolactone  25 mg Oral Daily  . thiamine  100 mg Oral Daily   Continuous Infusions:    . sodium chloride 20 mL (06/23/11 1858)   PRN Meds:.acetaminophen, acetaminophen, alum & mag hydroxide-simeth, diphenhydrAMINE, ondansetron (ZOFRAN) IV, ondansetron, oxyCODONE, zolpidem  Assessment/Plan: 1-Tachycardia: improved; now her Fluids changed to NSL.   2-Cirrhosis: Records from [facility] still pending; will wait for results and records in order to determine what further workup is needed. Patient cirrhosis suspected to be secondary to alcohol abuse.  3-Coagulopathy: Most likely associated to patient Lupus. Will continue lovenox per pharmacy.  4-Cachexia: continue ensure TID.  5-Hyponatremia: sodium 134; will continue monitoring sodium trend.  6-Ascites: Abdomen more distended today. Will start lasix and spironolactone; if ascites worsening will require paracentesis. Follow close I's and O's; low sodium diet.   7-GERD: continue Protonix.   LOS: 3 days   Antonio [name] [name] [phone]  06/24/2011, 10:47 AM

## 2011-06-24 NOTE — Progress Notes (Signed)
Utilization Review Completed.  Brandt Chaney T  06/24/2011  

## 2011-06-24 NOTE — Progress Notes (Addendum)
INITIAL ADULT NUTRITION ASSESSMENT Date: 06/24/2011   Time: 12:23 PM  Reason for Assessment: Consult  ASSESSMENT: Male 41 y.o.  Dx: Tachycardia  Hx: History reviewed. No pertinent past medical history.  Related Meds:     . enoxaparin (LOVENOX) injection  60 mg Subcutaneous Q12H  . feeding supplement  237 mL Oral TID WC  . folic acid  1 mg Oral Daily  . furosemide  20 mg Oral Daily  . pantoprazole  40 mg Oral Q1200  . propranolol  20 mg Oral BID  . sodium chloride  3 mL Intravenous Q12H  . spironolactone  25 mg Oral Daily  . thiamine  100 mg Oral Daily    Ht: 6\' 1"  (185.4 cm)  Wt: 147 lb 7.8 oz (66.9 kg)  Ideal Wt: 83.6 kg % Ideal Wt: 80%  Usual Wt: 165 lb % Usual Wt: 89%  Body mass index is 19.46 kg/(m^2).  Food/Nutrition Related Hx: unintentional weight loss > 10 lbs within the last month & appears severely malnourished per admission nutrition screen  Labs:  CMP     Component Value Date/Time   NA 134* 06/24/2011 0525   K 4.1 06/24/2011 0525   CL 98 06/24/2011 0525   CO2 27 06/24/2011 0525   GLUCOSE 114* 06/24/2011 0525   BUN 10 06/24/2011 0525   CREATININE 0.49* 06/24/2011 0525   CALCIUM 8.5 06/24/2011 0525   PROT 6.0 06/21/2011 1828   ALBUMIN 2.1* 06/21/2011 1828   AST 32 06/21/2011 1828   ALT 21 06/21/2011 1828   ALKPHOS 197* 06/21/2011 1828   BILITOT 0.4 06/21/2011 1828   GFRNONAA >90 06/24/2011 0525   GFRAA >90 06/24/2011 0525    I/O last 3 completed shifts: In: 243 [P.O.:240; I.V.:3] Out: 400 [Urine:400] Total I/O In: 243 [P.O.:240; I.V.:3] Out: -    Diet Order: Regular  Supplements/Tube Feeding: Ensure Clinical Strength PO TID  IVF:    DISCONTD: sodium chloride Last Rate: 20 mL (06/23/11 1858)    Estimated Nutritional Needs:   Kcal: 1800-2000 Protein: 90-100 gm Fluid: 1.8-2.0 L  RD spoke with pt re: nutrition hx -- reports a poor appetite with nausea; also reports a 45 lb weight loss x 3 weeks, however reported UBW is 165 lb (11%); PTA was  consuming smaller portions of food with vomiting after meals and stated "I couldn't hold anything down" -- RD interprets this intake to be < 50% of estimated nutrition needs; noted pt with visible muscle loss -- unable to determine severity; meets criteria for severe malnutrition in the context of acute illness or injury; noted long hx of alcohol abuse; current PO intake is variable at 10-75%; Ensure supplements ordered -- pt is drinking.  NUTRITION DIAGNOSIS: -Malnutrition (NI-5.2).  Status: Ongoing  RELATED TO: inadequate oral intake, cirrhosis  AS EVIDENCE BY: 11% weight loss x < 1 month, < 50% intake of estimated energy requirement for > 5 days  MONITORING/EVALUATION(Goals): Monitor: PO & supplemental intake, weight, labs, I/O's Goal: meet >90% of estimated nutrition needs to promote energy & protein repletion  EDUCATION NEEDS: -No education needs identified at this time  INTERVENTION:  Continue Ensure Clinical Strength PO TID (350 kcals, 13 gm protein per 8 fl oz bottle)  RD to follow for nutrition care plan  Dietitian #: [phone]  DOCUMENTATION CODES Per approved criteria  -Severe malnutrition in the context of acute illness or injury    Alger Memos 06/24/2011, 12:23 PM

## 2011-06-24 NOTE — Progress Notes (Signed)
   CARE MANAGEMENT NOTE 06/24/2011  Patient:  ARN, MCOMBER   Account Number:  [id number]  Date Initiated:  06/24/2011  Documentation initiated by:  Junius Creamer  Subjective/Objective Assessment:   adm w tachycardia     Action/Plan:   lives w fam, no pcp listed. pt from [employer].   Anticipated DC Date:  06/26/2011   Anticipated DC Plan:  HOME/SELF CARE  In-house referral  Clinical Social Worker      DC Planning Services  CM consult      Choice offered to / List presented to:             Status of service:   Medicare Important Message given?   (If response is "NO", the following Medicare IM given date fields will be blank) Date Medicare IM given:   Date Additional Medicare IM given:    Discharge Disposition:    Per UR Regulation:    If discussed at Long Length of Stay Meetings, dates discussed:    Comments:  3/18 sw consult for etoh co. debbie Jencarlo Bonadonna rn,bsn [phone]

## 2011-06-25 ENCOUNTER — Inpatient Hospital Stay (HOSPITAL_COMMUNITY): Payer: Medicaid Other

## 2011-06-25 LAB — BASIC METABOLIC PANEL
BUN: 11 mg/dL (ref 6–23)
CO2: 27 mEq/L (ref 19–32)
Chloride: 99 mEq/L (ref 96–112)
GFR calc non Af Amer: >90 mL/min (ref >90)
Glucose, Bld: 123 mg/dL — ABNORMAL HIGH (ref 70–99)
Potassium: 4.1 mEq/L (ref 3.5–5.1)

## 2011-06-25 LAB — CBC
HCT: 32.7 % — ABNORMAL LOW (ref 39.0–52.0)
Hemoglobin: 10.8 g/dL — ABNORMAL LOW (ref 13.0–17.0)
MCHC: 33 g/dL (ref 30.0–36.0)
RBC: 3.52 MIL/uL — ABNORMAL LOW (ref 4.22–5.81)

## 2011-06-25 MED ORDER — BOOST / RESOURCE BREEZE PO LIQD
1.0000 | Freq: Three times a day (TID) | ORAL | Status: DC
  Administered 2011-06-25 – 2011-06-26 (×3): 1 via ORAL

## 2011-06-25 MED ORDER — PRO-STAT SUGAR FREE PO LIQD
30.0000 mL | Freq: Two times a day (BID) | ORAL | Status: DC
  Administered 2011-06-30: 30 mL via ORAL
  Filled 2011-06-25 (×14): qty 30

## 2011-06-25 MED ORDER — ALBUMIN HUMAN 25 % IV SOLN
25.0000 g | Freq: Once | INTRAVENOUS | Status: AC
  Administered 2011-06-25: 25 g via INTRAVENOUS
  Filled 2011-06-25: qty 100

## 2011-06-25 NOTE — Procedures (Signed)
LLQ para using US guidance  6 liters dark brown fluid Pt tolerated well  BP stable

## 2011-06-25 NOTE — Progress Notes (Signed)
Pt stomach is tight and distended and pt states it hurts more on the right side. Bowel sounds present. Pt stated he does not want his ensure this am due to it making his stomach hurting. Last BM was 17th.

## 2011-06-25 NOTE — Progress Notes (Signed)
Subjective: No CP, still with mild SOB (due to ascites); reports pain in his belly and no significant diureses despite been on lasix and spironolactone. Afebrile.   Objective: Vital signs in last 24 hours: Temp:  [98 F (36.7 C)-98.4 F (36.9 C)] 98 F (36.7 C) (03/19 1000) Pulse Rate:  [90-114] 90  (03/19 1000) Resp:  [16-21] 20  (03/19 1000) BP: (108-122)/(77-92) 113/85 mmHg (03/19 1000) SpO2:  [96 %-98 %] 97 % (03/19 1000) Weight:  [67.314 kg (148 lb 6.4 oz)] 67.314 kg (148 lb 6.4 oz) (03/19 0600) Weight change:  Last BM Date: 06/23/11  Intake/Output from previous day: 03/18 0701 - 03/19 0700 In: 366 [P.O.:360; I.V.:6] Out: 800 [Urine:800] Total I/O In: 120 [P.O.:120] Out: -    Physical Exam: General: Alert, awake, oriented x3, in mild distress and discomfort due to ascites. cachetic HEENT: No bruits, no goiter. Heart: Regular rate and rhythm, without murmurs, rubs, gallops. Lungs: Clear to auscultation bilaterally. Abdomen: distended, tender to palpation, positive bowel sounds; spider stigmata appreciated Extremities: No clubbing or cyanosis; trace edema. Neuro: Grossly intact, nonfocal.  Lab Results: Basic Metabolic Panel:  Basename 06/25/11 0500 06/24/11 0525  NA 135 134*  K 4.1 4.1  CL 99 98  CO2 27 27  GLUCOSE 123* 114*  BUN 11 10  CREATININE 0.53 0.49*  CALCIUM 8.8 8.5  MG -- --  PHOS -- --   CBC:  Basename 06/25/11 0500 06/24/11 0525  WBC 10.1 8.3  NEUTROABS -- --  HGB 10.8* 10.1*  HCT 32.7* 30.7*  MCV 92.9 94.2  PLT 278 262   Coagulation:  Basename 06/24/11 1733  LABPROT 16.2*  INR 1.27    Studies/Results: No results found.  Medications: Scheduled Meds:    . albumin human  25 g Intravenous Once  . enoxaparin (LOVENOX) injection  60 mg Subcutaneous Q12H  . feeding supplement  30 mL Oral BID  . feeding supplement  1 Container Oral TID WC  . folic acid  1 mg Oral Daily  . furosemide  20 mg Oral Daily  . pantoprazole  40 mg Oral  Q1200  . propranolol  20 mg Oral BID  . sodium chloride  3 mL Intravenous Q12H  . spironolactone  25 mg Oral Daily  . thiamine  100 mg Oral Daily  . DISCONTD: feeding supplement  237 mL Oral TID WC   Continuous Infusions:   PRN Meds:.acetaminophen, acetaminophen, alum & mag hydroxide-simeth, diphenhydrAMINE, ondansetron (ZOFRAN) IV, ondansetron, oxyCODONE, zolpidem  Assessment/Plan: 1-Tachycardia: improved. Will monitorize closely especially after paracentesis ordered.   2-Cirrhosis: Records from [facility] reviewed; Patient cirrhosis suspected to be secondary to alcohol abuse and also hypoalbuminemia. Pathology from biopsy still pending.  3-Coagulopathy with recent hx of PE: Most likely associated to patient Lupus. Will continue lovenox per pharmacy for now; plan is for him to be discharged on xarelto.  4-Cachexia: will change ensure to resource TID; patient reports he can not tolerate ensure. Will also add prostat bid.  5-Hyponatremia: sodium 135; will continue monitoring sodium trend.  6-Ascites: Despite diuretics; patient ascites continue worsening. Will continue protein supplementation; order paracentesis (therapeutic) and will also give another dose of albumin. Follow close I's and O's; low sodium diet.   7-GERD: continue Protonix.   LOS: 4 days   Farren Landa [name] [phone]  06/25/2011, 12:13 PM

## 2011-06-26 ENCOUNTER — Encounter (HOSPITAL_COMMUNITY): Payer: Self-pay | Admitting: Physician Assistant

## 2011-06-26 DIAGNOSIS — R64 Cachexia: Secondary | ICD-10-CM

## 2011-06-26 DIAGNOSIS — R188 Other ascites: Secondary | ICD-10-CM

## 2011-06-26 MED ORDER — TUBERCULIN PPD 5 UNIT/0.1ML ID SOLN
5.0000 [IU] | Freq: Once | INTRADERMAL | Status: AC
  Administered 2011-06-26: 5 [IU] via INTRADERMAL
  Filled 2011-06-26: qty 0.1

## 2011-06-26 MED ORDER — DEXTROSE 5 % IV SOLN
2.0000 g | Freq: Three times a day (TID) | INTRAVENOUS | Status: DC
  Administered 2011-06-26 – 2011-06-27 (×3): 2 g via INTRAVENOUS
  Filled 2011-06-26 (×6): qty 2

## 2011-06-26 MED ORDER — SPIRONOLACTONE 100 MG PO TABS
100.0000 mg | ORAL_TABLET | Freq: Every day | ORAL | Status: DC
  Administered 2011-06-27 – 2011-07-01 (×5): 100 mg via ORAL
  Filled 2011-06-26 (×5): qty 1

## 2011-06-26 MED ORDER — FUROSEMIDE 40 MG PO TABS
40.0000 mg | ORAL_TABLET | Freq: Every day | ORAL | Status: DC
  Administered 2011-06-27 – 2011-07-01 (×5): 40 mg via ORAL
  Filled 2011-06-26 (×5): qty 1

## 2011-06-26 MED ORDER — FAMOTIDINE IN NACL 20-0.9 MG/50ML-% IV SOLN
20.0000 mg | Freq: Two times a day (BID) | INTRAVENOUS | Status: DC
  Administered 2011-06-26 – 2011-06-28 (×4): 20 mg via INTRAVENOUS
  Filled 2011-06-26 (×6): qty 50

## 2011-06-26 NOTE — Consult Note (Signed)
[facility] Consult: 2:47 PM 06/26/2011   Referring Provider: Dr Betti Cruz Primary Care Physician:  Gabriel Cirri, DO Primary Gastroenterologist:  Dr. Braulio Conte  Reason for Consultation:  Assistance with management of ascites and nausea vomiting  HPI: Antonio [name] is a 41 y.o. male.  He is an alcoholic who was hospitalized at [facility] for shortness of breath.   Ruled in for Pulmonary embolus.  Treated initially with Coumadin but discharged on Xarelto. Echocardiogram 06/08/11:  60 -65 % EF, mild tricuspid regurge, trivial pericardial effusion.  Noted to have ascites.  Underwent several paracentesis totaling approximately 19 L of fluid.  Ascitic WBC count was 480 on 2/28, 1230 on 06/17/11.  However a gram stain of 06/06/11 showed few WBCs, no organisms. He was not treated with ABX.  Cytologies were negative for malignancy.     He underwent Transjugular liver biopsy.  Pathology revealed diffuse steatosis involving up to 20% of the parenchyma. There was minimal chronic inflammatory change in the portal area confined to the limiting plate. There was minimal to mild portal fibrosis, no bridging fibrosis and no cirrhosis.  He tested negative for hepatitis B and C. Serologies. His alkaline phosphatase was as high as 209. He had biochemical evidence for pancreatitis with an amylase as high as 919 and lipase as high as 1802. CT scan of the abdomen and pelvis on 225 showed ascites and normal liver and pancreas, normal biliary tree, normal GB, no portal or splenic vein thrombosis. Follow up CT showed clot at iliac artery, femoral vein thrombosis  Patient was seen by Dr. Lorita Officer, GI specialist.  He underwent upper endoscopy 06/18/2011 showing an irregular appearance to the gastroesophageal junction of questionable significance. Otherwise mucosa was normal, no varices no portal hypertension noted. Dr. Braulio Conte placed a naso-small bowel feeding  tube to allow for supplemental nutrition. The patient's PO intake has been poor for several weeks and continues so. He's had nausea and vomiting for several weeks. He's lost at least 20 pounds.  Patient reports abstinence from alcohol for the last 10 weeks.  Says he just lost his taste for alcohol and therefore stopped drinking. In the past he was able to consume up to a case of beer daily.  The patient underwent multiple serologic tests. He was lupus anticoagulant positive. Sprue testing including gliadin ab, ttg, endomysial ab were all negative. The patient removed his small bowel feeding tube and declined replacement of the tube. He didn't want to be forced to take in nourishment that he had no taste for, despite the fact that this was being delivered via feeding tube. He opted to use boost and said he would do this at home. Discharge medication list includes Spironolactone 50 mg daily, 40 mg of omeprazole daily, Xarelto, no Lasix. No antibiotics. It does not sound like the patient had made stellar clinical improvement when discharged from [place] earlier this week. He has developed recurrent tense abdomen, diffuse abdominal pain, ongoing nausea and vomiting, ongoing anorexia.   His brother, and alcoholic with whom the patient resides, brought the patient to [facility] for reassessment. He has undergone another 6 L paracentesis yesterday, abdominal distention has improved, diffuse abdominal pain persists.  Ascitic fluid was not sent for analysis.  He has vomited several times today, nonbloody/non-coffee-ground material.    Past Medical History  Diagnosis Date  . Alcoholic hepatitis with ascites     liver bx march 2013, no cirrhosis.  Hep b/c negative  . Ascites     3 to  4 paracentesis from 2/28 to 06/10/11 at [facility] totalling  19 liters.  . Alcoholism /alcohol abuse 05/2011  . Pulmonary embolism 05/2011    sent home on Xarelto.  Marland Kitchen DVT (deep venous thrombosis) 05/2011    Past  Surgical History  Procedure Date  . Ankle surgery   . Esophagogastroduodenoscopy 3.12.2013    Dr Braulio Conte in [place].  Irregular GE Jx, biopsied benign, naso/SB feeding tube inserted   . Paracentesis     several during admission at [facility] 2/26- 3/18  . Liver biopsy 06/2011    transjugular    Prior to Admission medications   Medication Sig Start Date End Date Taking? Authorizing Provider  omeprazole (PRILOSEC) 20 MG capsule Take 20 mg by mouth 2 (two) times daily.   Yes Historical Provider, MD  oxyCODONE (OXY IR/ROXICODONE) 5 MG immediate release tablet Take 5 mg by mouth every 4 (four) hours as needed.   Yes Historical Provider, MD  prenatal vitamin w/FE, FA (PRENATAL 1 + 1) 27-1 MG TABS Take 1 tablet by mouth daily.   Yes Historical Provider, MD  promethazine (PHENERGAN) 12.5 MG tablet Take 12.5 mg by mouth every 6 (six) hours as needed. For nausea   Yes Historical Provider, MD  Rivaroxaban (XARELTO) 15 MG TABS tablet Take 15 mg by mouth daily.   Yes Historical Provider, MD  spironolactone (ALDACTONE) 25 MG tablet Take 25 mg by mouth 2 (two) times daily.   Yes Historical Provider, MD    Scheduled Meds:    . albumin human  25 g Intravenous Once  . enoxaparin (LOVENOX) injection  60 mg Subcutaneous Q12H  . feeding supplement  30 mL Oral BID  . feeding supplement  1 Container Oral TID WC  . folic acid  1 mg Oral Daily  . furosemide  20 mg Oral Daily  . pantoprazole  40 mg Oral Q1200  . propranolol  20 mg Oral BID  . sodium chloride  3 mL Intravenous Q12H  . spironolactone  25 mg Oral Daily  . thiamine  100 mg Oral Daily   Infusions:   PRN Meds: acetaminophen, acetaminophen, alum & mag hydroxide-simeth, diphenhydrAMINE, ondansetron (ZOFRAN) IV, ondansetron, oxyCODONE, zolpidem   Allergies as of 06/21/2011  . (No Known Allergies)    History reviewed. No pertinent family history.  History   Social History  . Marital Status: Single    Spouse Name: N/A     Number of Children: N/A  . Years of Education: N/A   Occupational History  . unemployed     used to do odd jobs   Social History Main Topics  . Smoking status: Current Everyday Smoker  . Smokeless tobacco: Not on file  . Alcohol Use: No  . Drug Use: No  . Sexually Active:    Other Topics Concern  . Not on file   Social History Narrative   Pt has been incarcerated in past. He is functionally illiterate, reading skills are poor.     REVIEW OF SYSTEMS: Constitutional:  Feels awful, and has been falling at home. ENT:  No nosebleeds, no sinus drainage Pulm:  Stable dyspnea on exertion, no cough CV:  No palpitations, no substernal chest pressure GU:  No oliguria, no tea colored urine GI:  above Heme:  No prior prescriptions for iron .    Transfusions:  none Neuro:  No headaches. No twitching. Has taken falls at home due to generalized weakness Derm:  No rash or itching. Multiple tattoos Endocrine:  No  excessive thirst, no sweats or chills Immunization:  Vaccination status unknown Travel:  None beyond [place]/ corridor GU: previous scrotal edema present a few weeks ago has resolved as has lower extremity edema  PHYSICAL EXAM: Vital signs in last 24 hours: Temp:  [97 F (36.1 C)-98.6 F (37 C)] 97 F (36.1 C) (03/20 1354) Pulse Rate:  [103-109] 109  (03/20 1354) Resp:  [16-20] 20  (03/20 1354) BP: (99-108)/(69-77) 108/77 mmHg (03/20 1354) SpO2:  [96 %-97 %] 97 % (03/20 1354)  General: markedly chronically ill appearing, weak white male looks at least a decade older than stated age. He is contacted/consumptive appearing Head:  Temporal wasting  Eyes:  No icterus, no conjunctival pallor Ears:  Not hard of hearing  Nose:  No discharge or sinus congestion Mouth:  Very few teeth remaining, those still standing and extremely poor repair with caries Neck:  No masses, JVD, bruits Lungs:  Diminished breath sounds bilaterally, no wheezing/rhonchi/crackles. Heart:  slightly tachy but regular rhythm Abdomen:  Soft, distended, diffusely tender, no guarding, no rebound.  bowel sounds active.   Rectal: deferred   Musc/Skeltl: no joint swelling or erythema Extremities:  No pedal edema GU: no scrotal edema  Neurologic:  No asterixis, no tremor, moves all 4 limbs. Not confused, oriented x3 Skin:  No rash, sores, telangiectasia Tattoos:  Several on upper body and arms Nodes:  None at neck   Psych:  Pleasant, flat affect, not anxious. Cooperative  Intake/Output from previous day: 03/19 0701 - 03/20 0700 In: 600 [P.O.:600] Out: 375 [Urine:375] Intake/Output this shift:    LAB RESULTS:  Basename 06/25/11 0500 06/24/11 0525  WBC 10.1 8.3  HGB 10.8* 10.1*  HCT 32.7* 30.7*  PLT 278 262   BMET Lab Results  Component Value Date   NA 135 06/25/2011   NA 134* 06/24/2011   NA 137 06/23/2011   K 4.1 06/25/2011   K 4.1 06/24/2011   K 4.1 06/23/2011   CL 99 06/25/2011   CL 98 06/24/2011   CL 103 06/23/2011   CO2 27 06/25/2011   CO2 27 06/24/2011   CO2 28 06/23/2011   GLUCOSE 123* 06/25/2011   GLUCOSE 114* 06/24/2011   GLUCOSE 109* 06/23/2011   BUN 11 06/25/2011   BUN 10 06/24/2011   BUN 11 06/23/2011   CREATININE 0.53 06/25/2011   CREATININE 0.49* 06/24/2011   CREATININE 0.52 06/23/2011   CALCIUM 8.8 06/25/2011   CALCIUM 8.5 06/24/2011   CALCIUM 8.6 06/23/2011   LFT No results found for this basename: PROT:3,ALBUMIN:3,AST:3,ALT:3,ALKPHOS:3,BILITOT:3,BILIDIR:3,IBILI:3 in the last 72 hours PT/INR Lab Results  Component Value Date   INR 1.27 06/24/2011   INR 1.92* 06/21/2011   Hepatitis Panel No results found for this basename: HEPBSAG,HCVAB,HEPAIGM,HEPBIGM in the last 72 hours C-Diff No components found with this basename: cdiff    Drugs of Abuse  No results found for this basename: labopia,  cocainscrnur,  labbenz,  amphetmu,  thcu,  labbarb     RADIOLOGY STUDIES: US Paracentesis  06/25/2011  *RADIOLOGY REPORT*  Clinical Data: Abdominal ascites;  cirrhosis  ULTRASOUND GUIDED PARACENTESIS  Comparison:  Previous paracentesis  An ultrasound guided paracentesis was thoroughly discussed with the patient and questions answered.  The benefits, risks, alternatives and complications were also discussed.  The patient understands and wishes to proceed with the procedure.  Written consent was obtained.  Ultrasound was performed to localize and mark an adequate pocket of fluid in the left lower quadrant of the abdomen.  The area was then  prepped and draped in the normal sterile fashion.  1% Lidocaine was used for local anesthesia.  Under ultrasound guidance a 19 gauge Yueh catheter was introduced.  Paracentesis was performed.  The catheter was removed and a dressing applied.  Complications:  None  Findings:  A total of approximately 6 liters of dark brown fluid was removed.  A fluid sample was not sent for laboratory analysis.  IMPRESSION: Successful ultrasound guided paracentesis yielding 6 liters of ascites.  Read by: Ralene Muskrat, P.A.-C  Original Report Authenticated By: Richarda Overlie, M.D.    ENDOSCOPIC STUDIES: EGD 06/18/2011 by Dr. Braulio Conte findings described in the history of present illness  IMPRESSION: 1. Ascites requiring multiple recent paracentesis. Total cumulative volume tapped on serial paracentesis now at 25 L since late February. Review of ascitic cell count revealed elevated nucleated WBCs, evidence of SBP, but the gram stain was negative.  He has not received abx treatment.  Etiology of the ascites not determined. Ascites cytology negative for cancerous cells. Abdominal pain not relieved by paracentesis.  2.  Recent elevation of lipase and amylase, CT negative for pancreatitis. Will check another lipase to rule out pancreatitis 3.  Alcoholism. Patient claims 10 weeks of abstinence. 4.  Malnutrition, failure to thrive.   5.  DVT Bil, PE Bil, on xarelto (holding with Lovenox in place)  6.  Positive lupus anticoagulant.  7.  Normocytic  anemia. 8.  Coagulopathy, Xarelto should not raise PT/INR  PLAN: 1.  Lipase level. 2.  Increase doses of Aldactone to 100 mg daily and add lasix 40 mg daily, following BMET. 3.  Stop Inderal, no evidence of varices, portal htn on EGD 4.  Add cefotaxime 2 gm IV q 8 hours ( per "up to Date" review) for SBP.  5.  Since he had no acid peptic dz on EGD, will change Protonix to Pepcid, given former's correlation with increased risk of C Diff.   More than 90 minutes spent reviewing the records, talking to patient and documenting.     LOS: 5 days   Jennye Moccasin  06/26/2011, 2:47 PM Pager: [phone]    ________________________________________________________________________  [facility] MD note:  I personally examined the patient, reviewed the data and agree with the assessment and plan described above.  Very cachectic appearing young man, alcoholic.  I think we should repeat paracentesis to check for peritonitis (sending fluid for cell count, diff, gram stain, culture).  We will order.  We have empirically started SBP antibiotics and will also increase diuretics.  Will repeat cmet in AM.  Celiac sprue already checked for with labs and duodenal biopsy.  May need repeat CT as well, await the above tests first.   Rob Bunting, MD [facility] Pager [phone]

## 2011-06-26 NOTE — Progress Notes (Signed)
PPD skin test ordered per GI. Called to verify about transferring the pt to a neg pressure room talked to Jennye Moccasin PA from GI and Dr. Betti Cruz per [facility]. Neg pressure room not required currently due to the clear chest x-ray non-symptomatic respiratory wise.

## 2011-06-26 NOTE — Progress Notes (Signed)
Subjective: Patient complaining of nausea vomiting.  Reports continued poor appetite.  Patient had paracentesis yesterday with 6 L removed.  Objective: Vital signs in last 24 hours: Filed Vitals:   06/25/11 2015 06/25/11 2055 06/26/11 0220 06/26/11 0500  BP: 99/69 102/72 108/77 105/76  Pulse: 103  105 107  Temp: 98.1 F (36.7 C)  98.6 F (37 C) 98.3 F (36.8 C)  TempSrc:      Resp: 16  20 16   Height:      Weight:      SpO2: 97%  96% 96%   Weight change:   Intake/Output Summary (Last 24 hours) at 06/26/11 1150 Last data filed at 06/26/11 0500  Gross per 24 hour  Intake    480 ml  Output    375 ml  Net    105 ml    Physical Exam: General: Awake, Oriented, No acute distress. HEENT: EOMI, poor dentition. Neck: Supple CV: S1 and S2 Lungs: Clear to ascultation bilaterally Abdomen: Tense, distended, generalized tenderness, +bowel sounds, no guarding. Ext: Good pulses. edema.  Lab Results:  Basename 06/25/11 0500 06/24/11 0525  NA 135 134*  K 4.1 4.1  CL 99 98  CO2 27 27  GLUCOSE 123* 114*  BUN 11 10  CREATININE 0.53 0.49*  CALCIUM 8.8 8.5  MG -- --  PHOS -- --   No results found for this basename: AST:2,ALT:2,ALKPHOS:2,BILITOT:2,PROT:2,ALBUMIN:2 in the last 72 hours No results found for this basename: LIPASE:2,AMYLASE:2 in the last 72 hours  Basename 06/25/11 0500 06/24/11 0525  WBC 10.1 8.3  NEUTROABS -- --  HGB 10.8* 10.1*  HCT 32.7* 30.7*  MCV 92.9 94.2  PLT 278 262   No results found for this basename: CKTOTAL:3,CKMB:3,CKMBINDEX:3,TROPONINI:3 in the last 72 hours No components found with this basename: POCBNP:3 No results found for this basename: DDIMER:2 in the last 72 hours No results found for this basename: HGBA1C:2 in the last 72 hours No results found for this basename: CHOL:2,HDL:2,LDLCALC:2,TRIG:2,CHOLHDL:2,LDLDIRECT:2 in the last 72 hours No results found for this basename: TSH,T4TOTAL,FREET3,T3FREE,THYROIDAB in the last 72 hours No results  found for this basename: VITAMINB12:2,FOLATE:2,FERRITIN:2,TIBC:2,IRON:2,RETICCTPCT:2 in the last 72 hours  Micro Results: No results found for this or any previous visit (from the past 240 hour(s)).  Studies/Results: US Paracentesis  06/25/2011  *RADIOLOGY REPORT*  Clinical Data: Abdominal ascites; cirrhosis  ULTRASOUND GUIDED PARACENTESIS  Comparison:  Previous paracentesis  An ultrasound guided paracentesis was thoroughly discussed with the patient and questions answered.  The benefits, risks, alternatives and complications were also discussed.  The patient understands and wishes to proceed with the procedure.  Written consent was obtained.  Ultrasound was performed to localize and mark an adequate pocket of fluid in the left lower quadrant of the abdomen.  The area was then prepped and draped in the normal sterile fashion.  1% Lidocaine was used for local anesthesia.  Under ultrasound guidance a 19 gauge Yueh catheter was introduced.  Paracentesis was performed.  The catheter was removed and a dressing applied.  Complications:  None  Findings:  A total of approximately 6 liters of dark brown fluid was removed.  A fluid sample was not sent for laboratory analysis.  IMPRESSION: Successful ultrasound guided paracentesis yielding 6 liters of ascites.  Read by: Ralene Muskrat, P.A.-C  Original Report Authenticated By: Richarda Overlie, M.D.    Medications: I have reviewed the patient's current medications. Scheduled Meds:   . albumin human  25 g Intravenous Once  . enoxaparin (LOVENOX) injection  60 mg Subcutaneous Q12H  . feeding supplement  30 mL Oral BID  . feeding supplement  1 Container Oral TID WC  . folic acid  1 mg Oral Daily  . furosemide  20 mg Oral Daily  . pantoprazole  40 mg Oral Q1200  . propranolol  20 mg Oral BID  . sodium chloride  3 mL Intravenous Q12H  . spironolactone  25 mg Oral Daily  . thiamine  100 mg Oral Daily   Continuous Infusions:  PRN Meds:.acetaminophen, acetaminophen,  alum & mag hydroxide-simeth, diphenhydrAMINE, ondansetron (ZOFRAN) IV, ondansetron, oxyCODONE, zolpidem  Assessment/Plan: 1. Ascites, no evidence for cirrhosis on imaging or on liver biopsy. Continue furosemide and spironolactone.  GI consultation for further input, appreciate their input.  Continue protein supplementation; paracentesis performed on 06/25/2011 had 6 L removed.  Follow close I's and O's; low sodium diet.   Records from [facility] reviewed, liver biopsy on 06/18/2011 showed mild periportal fibrosis, scant fragments of hepatic parenchyma, macrovesicular steatosis present.  Patient has had a long complicated hospital course from 06/04/2011 till 06/19/2011.  Patient was found to have a very emboli was initially started on Lovenox and Coumadin which was transitioned to Rivaroxaban.  The patient had rapid accumulation of abdominal ascites and has had a least 2 paracentesis during the course of hospital stay.  The patient had poor by mouth intake and had feeding tube placed however he was not tolerating the feeding tube as a result was discontinued in favor of oral supplemental nutrition.  Patient's GI is Dr. Jennye Boroughs.  2. Tachycardia: improved.  Stable.    3. Elevated liver function tests.  Likely due to alcohol abuse.  Biopsy results as indicated above.  4. Coagulopathy with recent history of pulmonary embolism, patient is lupus anticoagulant positive.  Continue lovenox per pharmacy for now; plan is for him to be discharged on xarelto.   5. Cachexia/severe protein calorie malnutrition.  Continue supplemental nutrition (resource). Continue prostat bid.   6. Hyponatremia: Resolved.    7. GERD: continue Protonix.  8. Prophylaxis.  Continue Lovenox.   LOS: 5 days  Faviola Klare A, MD 06/26/2011, 11:50 AM

## 2011-06-26 NOTE — Discharge Instructions (Signed)
Dr Clovis Riley in [place]  [phone] on 3/38 at 11am

## 2011-06-26 NOTE — Progress Notes (Signed)
Pt vomited after taking morning pills brown vomit. According to night nurse pt did vomit earlier at 6:20 am. Reported to Dr. Betti Cruz. Will monitor.

## 2011-06-27 ENCOUNTER — Inpatient Hospital Stay (HOSPITAL_COMMUNITY): Payer: Medicaid Other

## 2011-06-27 ENCOUNTER — Encounter (HOSPITAL_COMMUNITY): Payer: Self-pay | Admitting: Radiology

## 2011-06-27 DIAGNOSIS — K652 Spontaneous bacterial peritonitis: Secondary | ICD-10-CM | POA: Diagnosis present

## 2011-06-27 LAB — CBC
HCT: 32.2 % — ABNORMAL LOW (ref 39.0–52.0)
Hemoglobin: 10.7 g/dL — ABNORMAL LOW (ref 13.0–17.0)
MCH: 30.7 pg (ref 26.0–34.0)
MCV: 92.3 fL (ref 78.0–100.0)
RBC: 3.49 MIL/uL — ABNORMAL LOW (ref 4.22–5.81)
WBC: 9.7 10*3/uL (ref 4.0–10.5)

## 2011-06-27 LAB — PATHOLOGIST SMEAR REVIEW

## 2011-06-27 LAB — BODY FLUID CELL COUNT WITH DIFFERENTIAL
Lymphs, Fluid: 0 %
Other Cells, Fluid: 0 %

## 2011-06-27 LAB — LACTATE DEHYDROGENASE, PLEURAL OR PERITONEAL FLUID: LD, Fluid: 241 U/L — ABNORMAL HIGH (ref 3–23)

## 2011-06-27 LAB — COMPREHENSIVE METABOLIC PANEL
BUN: 13 mg/dL (ref 6–23)
CO2: 30 mEq/L (ref 19–32)
Calcium: 8.3 mg/dL — ABNORMAL LOW (ref 8.4–10.5)
Chloride: 96 mEq/L (ref 96–112)
Creatinine, Ser: 0.6 mg/dL (ref 0.50–1.35)
GFR calc Af Amer: >90 mL/min (ref >90)
GFR calc non Af Amer: >90 mL/min (ref >90)
Glucose, Bld: 135 mg/dL — ABNORMAL HIGH (ref 70–99)
Total Bilirubin: 0.4 mg/dL (ref 0.3–1.2)

## 2011-06-27 LAB — TRIGLYCERIDES, BODY FLUIDS: Triglycerides, Fluid: 19 mg/dL

## 2011-06-27 MED ORDER — PIPERACILLIN-TAZOBACTAM 3.375 G IVPB
3.3750 g | Freq: Three times a day (TID) | INTRAVENOUS | Status: DC
  Administered 2011-06-28 – 2011-06-30 (×8): 3.375 g via INTRAVENOUS
  Filled 2011-06-27 (×9): qty 50

## 2011-06-27 MED ORDER — IOHEXOL 300 MG/ML  SOLN
80.0000 mL | Freq: Once | INTRAMUSCULAR | Status: AC | PRN
  Administered 2011-06-27: 80 mL via INTRAVENOUS

## 2011-06-27 MED ORDER — PIPERACILLIN-TAZOBACTAM 3.375 G IVPB
3.3750 g | Freq: Three times a day (TID) | INTRAVENOUS | Status: DC
  Administered 2011-06-27: 3.375 g via INTRAVENOUS
  Filled 2011-06-27 (×3): qty 50

## 2011-06-27 MED ORDER — ONDANSETRON HCL 8 MG PO TABS
8.0000 mg | ORAL_TABLET | Freq: Two times a day (BID) | ORAL | Status: DC
  Administered 2011-06-27 – 2011-07-01 (×9): 8 mg via ORAL
  Filled 2011-06-27 (×10): qty 1

## 2011-06-27 MED ORDER — IOHEXOL 300 MG/ML  SOLN
20.0000 mL | INTRAMUSCULAR | Status: AC
  Administered 2011-06-27: 20 mL via ORAL

## 2011-06-27 MED ORDER — PIPERACILLIN-TAZOBACTAM 3.375 G IVPB
3.3750 g | Freq: Three times a day (TID) | INTRAVENOUS | Status: DC
  Filled 2011-06-27 (×2): qty 50

## 2011-06-27 NOTE — Progress Notes (Signed)
[facility] Progress Note   Subjective: Paracentesis this am, I reviewed fluid tests in epic.  Previous fluid testing at outside hospital also suggested high number of white cells in ascites.  Not clear if he was ever treated with abx however.   Objective: Vital signs in last 24 hours: Temp:  [97.4 F (36.3 C)-98.4 F (36.9 C)] 98.2 F (36.8 C) (03/21 1153) Pulse Rate:  [98-106] 101  (03/21 1153) Resp:  [18-19] 18  (03/21 1153) BP: (105-117)/(68-89) 109/80 mmHg (03/21 1153) SpO2:  [96 %-98 %] 97 % (03/21 1153) Weight:  [141 lb 15.6 oz (64.4 kg)] 141 lb 15.6 oz (64.4 kg) (03/21 0400) Last BM Date: 06/23/11 General: alert and oriented times 3, withered, cachectic appearing. Heart: regular rate and rythm Abdomen: soft, non-tender, non-distended, normal bowel sounds   Lab Results:  Basename 06/27/11 0610 06/25/11 0500  WBC 9.7 10.1  HGB 10.7* 10.8*  PLT 253 278  MCV 92.3 92.9    Basename 06/27/11 0610 06/25/11 0500  NA 135 135  K 4.2 4.1  CL 96 99  CO2 30 27  GLUCOSE 135* 123*  BUN 13 11  CREATININE 0.60 0.53  CALCIUM 8.3* 8.8    Basename 06/27/11 0838 06/27/11 0610  PROT -- 4.8*  ALBUMIN 2.3* 2.2*  AST -- 25  ALT -- 15  ALKPHOS -- 158*  BILITOT -- 0.4  BILIDIR -- --  IBILI -- --    Basename 06/24/11 1733  INR 1.27   Ascites shows huge number of PMNs, very elevated LDH, described as brown by radiology.  Assessment/Plan: 41 y.o. male with ascites (unclear etiology)  He likely has bacterial peritonitis, not clear if this is spontaneous or secondary (from intrabdominal process like perf, abscess etc).  I will order CT scan IV and PO contrast.  He was already started on abx yesterday (cefotaxime) but given the VERY high number of polys, I am going to change this to broader spectrum (zosyn IV) utntil we have more information from gram stain, culture on ascites fluid.    Rob Bunting, MD  06/27/2011, 2:18 PM [facility] Pager [phone]

## 2011-06-27 NOTE — Progress Notes (Signed)
Subjective: Reports feeling better today.  Appetite slightly improved.  Again had paracentesis and had 1.9 L removed.  Objective: Vital signs in last 24 hours: Filed Vitals:   06/27/11 0904 06/27/11 0915 06/27/11 0925 06/27/11 1153  BP: 110/89 110/86 110/85 109/80  Pulse:    101  Temp:    98.2 F (36.8 C)  TempSrc:    Oral  Resp:    18  Height:      Weight:      SpO2:    97%   Weight change:   Intake/Output Summary (Last 24 hours) at 06/27/11 1415 Last data filed at 06/27/11 1258  Gross per 24 hour  Intake      0 ml  Output   1000 ml  Net  -1000 ml    Physical Exam: General: Awake, Oriented, No acute distress. HEENT: EOMI, poor dentition. Neck: Supple CV: S1 and S2 Lungs: Clear to ascultation bilaterally Abdomen: Tense, less distended, generalized tenderness, +bowel sounds, no guarding. Ext: Good pulses. edema.  Lab Results:  Basename 06/27/11 0610 06/25/11 0500  NA 135 135  K 4.2 4.1  CL 96 99  CO2 30 27  GLUCOSE 135* 123*  BUN 13 11  CREATININE 0.60 0.53  CALCIUM 8.3* 8.8  MG -- --  PHOS -- --    Basename 06/27/11 0838 06/27/11 0610  AST -- 25  ALT -- 15  ALKPHOS -- 158*  BILITOT -- 0.4  PROT -- 4.8*  ALBUMIN 2.3* 2.2*    Basename 06/26/11 1524  LIPASE 91*  AMYLASE --    Basename 06/27/11 0610 06/25/11 0500  WBC 9.7 10.1  NEUTROABS -- --  HGB 10.7* 10.8*  HCT 32.2* 32.7*  MCV 92.3 92.9  PLT 253 278   No results found for this basename: CKTOTAL:3,CKMB:3,CKMBINDEX:3,TROPONINI:3 in the last 72 hours No components found with this basename: POCBNP:3 No results found for this basename: DDIMER:2 in the last 72 hours No results found for this basename: HGBA1C:2 in the last 72 hours No results found for this basename: CHOL:2,HDL:2,LDLCALC:2,TRIG:2,CHOLHDL:2,LDLDIRECT:2 in the last 72 hours No results found for this basename: TSH,T4TOTAL,FREET3,T3FREE,THYROIDAB in the last 72 hours No results found for this basename:  VITAMINB12:2,FOLATE:2,FERRITIN:2,TIBC:2,IRON:2,RETICCTPCT:2 in the last 72 hours  Micro Results: No results found for this or any previous visit (from the past 240 hour(s)).  Studies/Results: US Paracentesis  06/25/2011  *RADIOLOGY REPORT*  Clinical Data: Abdominal ascites; cirrhosis  ULTRASOUND GUIDED PARACENTESIS  Comparison:  Previous paracentesis  An ultrasound guided paracentesis was thoroughly discussed with the patient and questions answered.  The benefits, risks, alternatives and complications were also discussed.  The patient understands and wishes to proceed with the procedure.  Written consent was obtained.  Ultrasound was performed to localize and mark an adequate pocket of fluid in the left lower quadrant of the abdomen.  The area was then prepped and draped in the normal sterile fashion.  1% Lidocaine was used for local anesthesia.  Under ultrasound guidance a 19 gauge Yueh catheter was introduced.  Paracentesis was performed.  The catheter was removed and a dressing applied.  Complications:  None  Findings:  A total of approximately 6 liters of dark brown fluid was removed.  A fluid sample was not sent for laboratory analysis.  IMPRESSION: Successful ultrasound guided paracentesis yielding 6 liters of ascites.  Read by: Ralene Muskrat, P.A.-C  Original Report Authenticated By: Richarda Overlie, M.D.    Medications: I have reviewed the patient's current medications. Scheduled Meds:    .  cefoTAXime (CLAFORAN) IV  2 g Intravenous Q8H  . enoxaparin (LOVENOX) injection  60 mg Subcutaneous Q12H  . famotidine (PEPCID) IV  20 mg Intravenous Q12H  . feeding supplement  30 mL Oral BID  . feeding supplement  1 Container Oral TID WC  . folic acid  1 mg Oral Daily  . furosemide  40 mg Oral Daily  . ondansetron  8 mg Oral Q12H  . sodium chloride  3 mL Intravenous Q12H  . spironolactone  100 mg Oral Daily  . thiamine  100 mg Oral Daily  . tuberculin  5 Units Intradermal Once  . DISCONTD:  furosemide  20 mg Oral Daily  . DISCONTD: pantoprazole  40 mg Oral Q1200  . DISCONTD: propranolol  20 mg Oral BID  . DISCONTD: spironolactone  25 mg Oral Daily   Continuous Infusions:  PRN Meds:.acetaminophen, acetaminophen, alum & mag hydroxide-simeth, diphenhydrAMINE, oxyCODONE, zolpidem, DISCONTD: ondansetron (ZOFRAN) IV, DISCONTD: ondansetron  Assessment/Plan: 1. Ascites, no evidence for cirrhosis on imaging or on liver biopsy. Continue furosemide and spironolactone.  GI following appreciate their input.  Continue protein supplementation; paracentesis performed on 06/25/2011 had 6 L removed.  Repeat paracentesis performed on 06/27/2011, 1.9 L removed.  Follow close I's and O's; low sodium diet.  Ascites fluid showing WBC count of 2193 with 91% neutrophils suggestive of SBP.  Patient started on Cefotaxime on 06/26/2011, depending on urine culture results can likely be transitioned to ciprofloxacin.  Records from [facility] reviewed, liver biopsy on 06/18/2011 showed mild periportal fibrosis, scant fragments of hepatic parenchyma, macrovesicular steatosis present.  Patient has had a long complicated hospital course from 06/04/2011 till 06/19/2011.  Patient was found to have a very emboli was initially started on Lovenox and Coumadin which was transitioned to Rivaroxaban.  The patient had rapid accumulation of abdominal ascites and has had a least 2 paracentesis during the course of hospital stay.  The patient had poor by mouth intake and had feeding tube placed however he was not tolerating the feeding tube as a result was discontinued in favor of oral supplemental nutrition.  Patient's GI is Dr. Jennye Boroughs.  2. Spontaneous bacterial peritonitis.  Management as indicated above.  3. Tachycardia: improved.  Stable.    4. Coagulopathy with recent history of pulmonary embolism, patient is lupus anticoagulant positive.  Continue lovenox per pharmacy for now; plan is for him to be  discharged on xarelto.   5. Cachexia/severe protein calorie malnutrition.  Continue supplemental nutrition (resource). Continue prostat bid.   6. Hyponatremia: Resolved.    7. GERD, Protonix transitioned to famotidine.  8. Prophylaxis.  Continue Lovenox.  9.  Disposition.  Pending, PT/OT.   LOS: 6 days  Eknoor Novack A, MD 06/27/2011, 2:15 PM

## 2011-06-27 NOTE — Evaluation (Signed)
Occupational Therapy Evaluation Patient Details Name: Antonio [name] MRN: [id number] DOB: 08-04-1970 Today's Date: 06/27/2011  Problem List:  Patient Active Problem List  Diagnoses  . Tachycardia  . Cirrhosis  . Coagulopathy  . Cachexia  . Hyponatremia  . Failure to thrive  . Ascites  . SBP (spontaneous bacterial peritonitis)    Past Medical History:  Past Medical History  Diagnosis Date  . Alcoholic hepatitis with ascites     liver bx march 2013, no cirrhosis.  Hep b/c negative  . Ascites     3 to 4 paracentesis from 2/28 to 06/10/11 at [facility] totalling  19 liters.  . Alcoholism /alcohol abuse 05/2011  . Pulmonary embolism 05/2011    sent home on Xarelto.  Marland Kitchen DVT (deep venous thrombosis) 05/2011   Past Surgical History:  Past Surgical History  Procedure Date  . Ankle surgery   . Esophagogastroduodenoscopy 3.12.2013    Dr Braulio Conte in [place].  Irregular GE Jx, biopsied benign, naso/SB feeding tube inserted   . Paracentesis     several during admission at [facility] 2/26- 3/18  . Liver biopsy 06/2011    transjugular    OT Assessment/Plan/Recommendation OT Assessment:41 y.o. male with a long history of Alcohol abuse and recent hospitalization at [facility] for Ascites and Liver disease who presents to the [facility] ED for evaluation due to complaints of increased weakness and falls and for the past 2 days and profound weight loss. Pt is unsteady on his feet with decreased endurance, pain also impeding independence in ADL.  Will follow acutely.  May need HHOT upon d/c depending on progress.   OT Recommendation/Assessment: Patient will need skilled OT in the acute care venue OT Problem List: Decreased strength;Decreased activity tolerance;Impaired balance (sitting and/or standing);Pain;Decreased knowledge of use of DME or AE OT Therapy Diagnosis : Generalized weakness;Acute pain OT Plan OT Frequency: Min 2X/week OT Treatment/Interventions:  Self-care/ADL training;Patient/family education;DME and/or AE instruction;Energy conservation OT Recommendation Follow Up Recommendations: Home health OT Equipment Recommended: Other (comment) Individuals Consulted Consulted and Agree with Results and Recommendations: Patient OT Goals Acute Rehab OT Goals OT Goal Formulation: With patient Time For Goal Achievement: 2 weeks ADL Goals Pt Will Perform Grooming: with modified independence;Standing at sink ADL Goal: Grooming - Progress: Goal set today Pt Will Perform Lower Body Bathing: with modified independence;Standing at sink;Sitting at sink ADL Goal: Lower Body Bathing - Progress: Goal set today Pt Will Perform Lower Body Dressing: with modified independence;Sitting, bed;Sit to stand from bed ADL Goal: Lower Body Dressing - Progress: Goal set today Pt Will Transfer to Toilet: with modified independence;Ambulation;Regular height toilet ADL Goal: Toilet Transfer - Progress: Goal set today Pt Will Perform Toileting - Clothing Manipulation: Independently;Standing ADL Goal: Toileting - Clothing Manipulation - Progress: Goal set today Pt Will Perform Toileting - Hygiene: Independently;Leaning right and/or left on 3-in-1/toilet ADL Goal: Toileting - Hygiene - Progress: Goal set today Pt Will Perform Tub/Shower Transfer: Tub transfer;with modified independence;Ambulation;Other (comment) ADL Goal: Tub/Shower Transfer - Progress: Goal set today Miscellaneous OT Goals Miscellaneous OT Goal #1: Pt will generalize energy conservation techniques in ADL with min cues.  OT Evaluation Precautions/Restrictions  Precautions Precautions: Fall Restrictions Weight Bearing Restrictions: No Prior Functioning Home Living Lives With: Family (brother who is an alcoholic per chart) Type of Home: Mobile home Home Access: Stairs to enter Entrance Stairs-Rails: Right;Left;Can reach both Entrance Stairs-Number of Steps: 3 Bathroom Shower/Tub: Tub/shower  unit;Other (comment) (very high wall on tub) Bathroom Toilet: Standard Home Adaptive  Equipment: None Prior Function Level of Independence: Independent with basic ADLs;Independent with homemaking with ambulation;Independent with gait;Independent with transfers Able to Take Stairs?: Yes Driving: No ADL ADL Eating/Feeding: Performed;Independent Where Assessed - Eating/Feeding: Bed level Grooming: Performed;Wash/dry hands;Set up;Teeth care Where Assessed - Grooming: Sitting, bed Upper Body Bathing: Performed;Set up Where Assessed - Upper Body Bathing: Sitting, bed Lower Body Bathing: Simulated;Maximal assistance Lower Body Bathing Details (indicate cue type and reason): Difficulty accessing feet due to abdominal pain. Where Assessed - Lower Body Bathing: Sitting, bed;Sit to stand from bed Upper Body Dressing: Simulated;Set up Where Assessed - Upper Body Dressing: Sitting, bed Lower Body Dressing: Performed;Maximal assistance Lower Body Dressing Details (indicate cue type and reason): unable to access feet due to abdominal pain Where Assessed - Lower Body Dressing: Sitting, bed;Sit to stand from bed Toilet Transfer: Simulated;Minimal assistance Toilet Transfer Method: Stand pivot ADL Comments: ADL independence limited by fatigue and abdominal pain.  Pt requiring min assist to stand to use urinal bedside, unsteady on feet.  Pt reports walking to bathroom holding furniture.  Cautioned pt to call for help as he is a high fall risk.   Vision/Perception  Vision - History Patient Visual Report: No change from baseline Cognition Cognition Arousal/Alertness: Awake/alert Overall Cognitive Status: Appears within functional limits for tasks assessed Sensation/Coordination Sensation Light Touch: Appears Intact Hot/Cold: Appears Intact Proprioception: Appears Intact Coordination Gross Motor Movements are Fluid and Coordinated: Yes Fine Motor Movements are Fluid and Coordinated: Yes Extremity  Assessment RUE Assessment RUE Assessment: Within Functional Limits LUE Assessment LUE Assessment: Within Functional Limits Mobility  Bed Mobility Bed Mobility: Yes (supervision, extra time,use of rail) Transfers Transfers: Yes Sit to Stand: 4: Min assist;With upper extremity assist;From bed (min guard) Stand to Sit: 5: Supervision;To bed;With upper extremity assist End of Session OT - End of Session Equipment Utilized During Treatment: Gait belt Activity Tolerance: Patient limited by fatigue;Patient limited by pain Patient left: in bed;with call bell in reach;with bed alarm set General Behavior During Session: [facility] for tasks performed Cognition: [facility] for tasks performed   Evern Bio 06/27/2011, 2:41 PM

## 2011-06-27 NOTE — Procedures (Signed)
RLQ paracentesis US guided 1.9 liters dark brown fluid  Sent for labs per MD  Pt tolerated well BP stable 110/85

## 2011-06-27 NOTE — Progress Notes (Signed)
ANTICOAGULATION CONSULT NOTE - Follow Up Consult  Pharmacy Consult for Lovenox Indication: pulmonary embolus  No Known Allergies  Patient Measurements: Height: 6\' 1"  (185.4 cm) Weight: 141 lb 15.6 oz (64.4 kg) IBW/kg (Calculated) : 79.9  Heparin Dosing Weight: 65 kg  Vital Signs: Temp: 97.4 F (36.3 C) (03/21 0727) Temp src: Oral (03/21 0727) BP: 110/85 mmHg (03/21 0925) Pulse Rate: 98  (03/21 0727)  Labs:  Basename 06/27/11 0610 06/25/11 0500 06/24/11 1733  HGB 10.7* 10.8* --  HCT 32.2* 32.7* --  PLT 253 278 --  APTT -- -- --  LABPROT -- -- 16.2*  INR -- -- 1.27  HEPARINUNFRC -- -- --  CREATININE 0.60 0.53 --  CKTOTAL -- -- --  CKMB -- -- --  TROPONINI -- -- --   Estimated Creatinine Clearance: 114 ml/min (by C-G formula based on Cr of 0.6).  Assessment: Recent admission at [facility]. New bilateral PE while at [facility] with + lupus anticoagulant. Patient reports discharge on 3/14 on Xarelto. Xarelto changed to Lovenox 3/16. Dose 60mg /12h. MD note says will discharg on Xarelto. Renal function and Scr stable.  Goal of therapy: full anticoagulation  Plan:  Lovenox 60 mg SQ q12hrs. Merilynn Finland, [place] Yeatts Stillinger 06/27/2011,9:49 AM

## 2011-06-27 NOTE — Progress Notes (Signed)
PT Evaluation 06/27/11 1400  PT Visit Information  Last PT Received On 06/27/11  Precautions  Precautions Fall  Home Living  Lives With Family (brother who is an alcoholic per chart)  Type of Home Mobile home  Home Access Stairs to enter  Entrance Stairs-Rails Right;Left;Can reach both  Entrance Stairs-Number of Steps 3  Bathroom Shower/Tub Tub/shower unit;Other (comment) (very high wall on tub)  Bathroom Toilet Standard  Home Adaptive Equipment None  Prior Function  Level of Independence Independent with basic ADLs;Independent with homemaking with ambulation;Independent with gait;Independent with transfers  Able to Take Stairs? Yes  Driving No  Cognition  Arousal/Alertness Awake/alert  Overall Cognitive Status Appears within functional limits for tasks assessed  Sensation  Light Touch Appears Intact  Hot/Cold Appears Intact  Proprioception Appears Intact  Coordination  Gross Motor Movements are Fluid and Coordinated Yes  Fine Motor Movements are Fluid and Coordinated Yes  Bed Mobility  Bed Mobility Yes  Supine to Sit 6: Modified independent (Device/Increase time);With rails;HOB elevated (Comment degrees)  Sitting - Scoot to Edge of Bed 6: Modified independent (Device/Increase time);With rail  Sit to Supine 6: Modified independent (Device/Increase time);HOB elevated (comment degrees);With rail  Transfers  Transfers Yes  Sit to Stand 5: Supervision  Sit to Stand Details (indicate cue type and reason) supervision for safety  Stand to Sit 5: Supervision  Stand to Sit Details supervision for safety  Ambulation/Gait  Ambulation/Gait Yes  Ambulation/Gait Assistance 5: Supervision  Ambulation/Gait Assistance Details (indicate cue type and reason) supervision for safety, patient's HR increased to 128 bpm with gait.  O2 sats greater than 90% on RA.  He needed one sitting rest break at 28' before continuing.    Ambulation Distance (Feet) 150 Feet  Assistive device None  Gait  Pattern Step-through pattern;Trunk flexed  Gait velocity 2.28 ft/sec which puts him at a neighborhood ambulation level.  For him to be an independent community ambulator he will need to be able to walk >2.62 ft/sec and to be able to safely cross the street he will need to be able to walk >4.4 ft/sec.   RLE Strength  RLE Overall Strength Comments grossly 4/5  LLE Strength  LLE Overall Strength Comments grossly 4/5  PT - End of Session  Activity Tolerance Patient limited by fatigue;Patient limited by pain  Patient left in bed;with call bell in reach;with bed alarm set  Nurse Communication Mobility status for ambulation (encouraged walking multiple time per day with staff)  General  Behavior During Session [facility] for tasks performed  Cognition [facility] for tasks performed  PT Assessment  Clinical Impression Statement 41 y.o. male admitted to [facility] for abdominal pain nausea, vomiting.  History significant for ETOH abuse.  S/p thoracentesis today.  He presnets with generalized deconditioning, decreased endurance and tachycardia with gait.  His gait speed is less than expected for his age and his activity tolerance is certainly much less than I would anticipate for his age.    PT Recommendation/Assessment Patient will need skilled PT in the acute care venue  PT Problem List Decreased strength;Decreased activity tolerance;Decreased balance;Cardiopulmonary status limiting activity  PT Therapy Diagnosis  Difficulty walking;Abnormality of gait;Generalized weakness;Acute pain  PT Plan  PT Frequency Min 3X/week  PT Treatment/Interventions Gait training;Stair training;Functional mobility training;Therapeutic activities;Therapeutic exercise;Balance training;Neuromuscular re-education;Patient/family education  PT Recommendation  Follow Up Recommendations No PT follow up  Equipment Recommended None recommended by PT  Individuals Consulted  Consulted and Agree with Results and Recommendations Patient  Acute Rehab PT  Goals  PT Goal Formulation With patient  Time For Goal Achievement 7 days  Pt will go Supine/Side to Sit Independently;with HOB 0 degrees  PT Goal: Supine/Side to Sit - Progress Goal set today  Pt will go Sit to Supine/Side Independently;with HOB 0 degrees;with rail  PT Goal: Sit to Supine/Side - Progress Goal set today  Pt will go Sit to Stand Independently  PT Goal: Sit to Stand - Progress Goal set today  Pt will go Stand to Sit Independently  PT Goal: Stand to Sit - Progress Goal set today  Pt will Transfer Bed to Chair/Chair to Bed Independently  PT Transfer Goal: Bed to Chair/Chair to Bed - Progress Goal set today  Pt will Go Up / Down Stairs with modified independence;3-5 stairs;with rail(s)  PT Goal: Up/Down Stairs - Progress Goal set today   Esaw Knippel B. Kem Hensen, PT, DPT [phone]

## 2011-06-28 LAB — CBC
HCT: 32.8 % — ABNORMAL LOW (ref 39.0–52.0)
Hemoglobin: 10.9 g/dL — ABNORMAL LOW (ref 13.0–17.0)
MCH: 30.6 pg (ref 26.0–34.0)
MCHC: 33.2 g/dL (ref 30.0–36.0)
RDW: 13.5 % (ref 11.5–15.5)

## 2011-06-28 LAB — BASIC METABOLIC PANEL
BUN: 12 mg/dL (ref 6–23)
Calcium: 8.4 mg/dL (ref 8.4–10.5)
Creatinine, Ser: 0.67 mg/dL (ref 0.50–1.35)
GFR calc Af Amer: >90 mL/min (ref >90)
GFR calc non Af Amer: >90 mL/min (ref >90)
Glucose, Bld: 118 mg/dL — ABNORMAL HIGH (ref 70–99)

## 2011-06-28 MED ORDER — FAMOTIDINE 20 MG PO TABS
20.0000 mg | ORAL_TABLET | Freq: Two times a day (BID) | ORAL | Status: DC
  Administered 2011-06-28 – 2011-07-01 (×6): 20 mg via ORAL
  Filled 2011-06-28 (×7): qty 1

## 2011-06-28 NOTE — Progress Notes (Signed)
Subjective: Still having abdominal pain.  Appetite improving.   Objective: Vital signs in last 24 hours: Filed Vitals:   06/28/11 0000 06/28/11 0400 06/28/11 0743 06/28/11 1153  BP: 110/89 111/82 110/85 113/82  Pulse: 107 104 111 107  Temp: 98.3 F (36.8 C) 97.7 F (36.5 C) 98.3 F (36.8 C) 97.7 F (36.5 C)  TempSrc: Oral Oral Oral Oral  Resp: 19 20 18 20   Height:      Weight:  64.7 kg (142 lb 10.2 oz)    SpO2: 98% 96% 97% 98%   Weight change: 0.3 kg (10.6 oz)  Intake/Output Summary (Last 24 hours) at 06/28/11 1416 Last data filed at 06/28/11 0801  Gross per 24 hour  Intake    355 ml  Output    750 ml  Net   -395 ml    Physical Exam: General: Awake, Oriented, No acute distress. HEENT: EOMI, poor dentition. Neck: Supple CV: S1 and S2 Lungs: Clear to ascultation bilaterally Abdomen: Tense, less distended, generalized tenderness, +bowel sounds, no guarding. Ext: Good pulses. edema.  Lab Results:  [facility] 06/28/11 0450 06/27/11 0610  NA 132* 135  K 3.9 4.2  CL 92* 96  CO2 33* 30  GLUCOSE 118* 135*  BUN 12 13  CREATININE 0.67 0.60  CALCIUM 8.4 8.3*  MG -- --  PHOS -- --    Basename 06/27/11 0838 06/27/11 0610  AST -- 25  ALT -- 15  ALKPHOS -- 158*  BILITOT -- 0.4  PROT -- 4.8*  ALBUMIN 2.3* 2.2*    Basename 06/26/11 1524  LIPASE 91*  AMYLASE --    Basename 06/28/11 0450 06/27/11 0610  WBC 9.6 9.7  NEUTROABS -- --  HGB 10.9* 10.7*  HCT 32.8* 32.2*  MCV 92.1 92.3  PLT 273 253   No results found for this basename: CKTOTAL:3,CKMB:3,CKMBINDEX:3,TROPONINI:3 in the last 72 hours No components found with this basename: POCBNP:3 No results found for this basename: DDIMER:2 in the last 72 hours No results found for this basename: HGBA1C:2 in the last 72 hours No results found for this basename: CHOL:2,HDL:2,LDLCALC:2,TRIG:2,CHOLHDL:2,LDLDIRECT:2 in the last 72 hours No results found for this basename: TSH,T4TOTAL,FREET3,T3FREE,THYROIDAB in the last  72 hours No results found for this basename: VITAMINB12:2,FOLATE:2,FERRITIN:2,TIBC:2,IRON:2,RETICCTPCT:2 in the last 72 hours  Micro Results: Recent Results (from the past 240 hour(s))  BODY FLUID CULTURE     Status: Normal (Preliminary result)   Collection Time   06/27/11  9:13 AM      Component Value Range Status Comment   Specimen Description ABDOMEN ASCITIC FLUID   Final    Special Requests   Final    Gram Stain     Final    Value: MODERATE WBC PRESENT, PREDOMINANTLY PMN     NO ORGANISMS SEEN   Culture NO GROWTH 1 DAY   Final    Report Status PENDING   Incomplete     Studies/Results: Ct Abdomen Pelvis W Contrast  06/27/2011  *RADIOLOGY REPORT*  Clinical Data: Diffuse abdominal pain, ascites which appeared infected at paracentesis, question source; past history DVT, pulmonary embolism, alcoholic hepatitis  CT ABDOMEN AND PELVIS WITH CONTRAST  Technique:  Multidetector CT imaging of the abdomen and pelvis was performed following the standard protocol during bolus administration of intravenous contrast. Sagittal and coronal MPR images reconstructed from axial data set.  Contrast:  Dilute oral contrast.  80 ml Omnipaque-300 IV  Comparison: 06/07/2011  Findings: Bibasilar small pleural effusions with minimal compressive atelectasis of lower lobes. More focal  areas of parenchymal opacity/volume loss are seen at the bases of the right middle and right lower lobes, question atelectasis versus pulmonary infarcts in patient with known large pulmonary emboli at these sites. Liver, spleen, pancreas, kidneys, and adrenal glands normal appearance. Contracted gallbladder.  Significant ascites with newly identified significant peritoneal enhancement highly suggestive of peritonitis. Diffuse dilatation of small bowel loops without evidence of obstruction. Normal appendix. Normal-appearing bladder. No definite mass, adenopathy or hernia. Colon and stomach unremarkable. No acute osseous findings. Bones  appear questionably demineralized.  IMPRESSION: Significant ascites with newly identified peritoneal enhancement diffusely highly suggestive of peritonitis. No focal identifiable cause for peritonitis is identified. Small bibasilar pleural effusions with atelectasis versus infarcts at right lung base.  Original Report Authenticated By: Lollie Marrow, M.D.   US Paracentesis  06/28/2011  *RADIOLOGY REPORT*  Clinical Data: Abdominal ascites; cirrhosis  ULTRASOUND GUIDED PARACENTESIS  Comparison:  Previous paracentesis  An ultrasound guided paracentesis was thoroughly discussed with the patient and questions answered.  The benefits, risks, alternatives and complications were also discussed.  The patient understands and wishes to proceed with the procedure.  Written consent was obtained.  Ultrasound was performed to localize and mark an adequate pocket of fluid in the right lower quadrant of the abdomen.  The area was then prepped and draped in the normal sterile fashion.  1% Lidocaine was used for local anesthesia.  Under ultrasound guidance a 19 gauge Yueh catheter was introduced.  Paracentesis was performed.  The catheter was removed and a dressing applied.  Complications:  None  Findings:  A total of approximately 1.9 liters of dark brown fluid was removed.  A fluid sample was sent for laboratory analysis.  IMPRESSION: Successful ultrasound guided paracentesis yielding 1.9 liters of ascites.  Read by: Ralene Muskrat, P.A.-C  Original Report Authenticated By: Judie Petit. Ruel Favors, M.D.    Medications: I have reviewed the patient's current medications. Scheduled Meds:    . enoxaparin (LOVENOX) injection  60 mg Subcutaneous Q12H  . famotidine  20 mg Oral BID  . feeding supplement  30 mL Oral BID  . feeding supplement  1 Container Oral TID WC  . folic acid  1 mg Oral Daily  . furosemide  40 mg Oral Daily  . iohexol  20 mL Oral Q1 Hr x 2  . ondansetron  8 mg Oral Q12H  . piperacillin-tazobactam (ZOSYN)  IV   3.375 g Intravenous Q8H  . sodium chloride  3 mL Intravenous Q12H  . spironolactone  100 mg Oral Daily  . thiamine  100 mg Oral Daily  . DISCONTD: cefoTAXime (CLAFORAN) IV  2 g Intravenous Q8H  . DISCONTD: famotidine (PEPCID) IV  20 mg Intravenous Q12H  . DISCONTD: piperacillin-tazobactam (ZOSYN)  IV  3.375 g Intravenous Q8H  . DISCONTD: piperacillin-tazobactam (ZOSYN)  IV  3.375 g Intravenous Q8H   Continuous Infusions:  PRN Meds:.acetaminophen, acetaminophen, alum & mag hydroxide-simeth, diphenhydrAMINE, iohexol, oxyCODONE, zolpidem  Assessment/Plan: 1. Ascites, no evidence for cirrhosis on imaging or on liver biopsy. Continue furosemide and spironolactone.  Continue protein supplementation; paracentesis performed on 06/25/2011 had 6 L removed.  Repeat paracentesis performed on 06/27/2011, 1.9 L removed.  Ascites fluid on 06/27/2011 showed WBC count of 2193 with 91% neutrophils suggestive of SBP.  Patient started on Cefotaxime on 06/26/2011 was transitioned to Zosyn on 06/27/2011, depending on culture results consider transitioning to oral antibiotics.  CT of the abdomen and pelvis obtained on 06/27/2011 which showed significant peritonitis otherwise no focal identifiable  cause for peritonitis.  2. Spontaneous bacterial peritonitis.  Management as indicated above.  3. Tachycardia: improved.  Stable.  No events noted on telemetry.  Discontinue telemetry.  4. Coagulopathy with recent history of pulmonary embolism, patient is lupus anticoagulant positive.  Continue lovenox per pharmacy for now; plan is for him to be discharged on xarelto.   5. Cachexia/severe protein calorie malnutrition.  Continue supplemental nutrition (resource). Continue prostat bid.   6. Hyponatremia: Resolved.    7. GERD, Protonix transitioned to famotidine.  8. Prophylaxis.  Continue Lovenox.  9.  Disposition.  Pending.   LOS: 7 days  Milia Warth A, MD 06/28/2011, 2:16 PM

## 2011-06-28 NOTE — Progress Notes (Signed)
[facility] Progress Note   Subjective: Changed to zosyn yesterday for peritonitis.  CT also confirmed he has peritonitis but offers no etiology  He has abd pain, not really improved in past 2-3 days.  He "thought he had infection in belly" but was never told so, never on Abx that I can tell   Objective: Vital signs in last 24 hours: Temp:  [97.7 F (36.5 C)-98.3 F (36.8 C)] 98.3 F (36.8 C) (03/22 0743) Pulse Rate:  [101-128] 111  (03/22 0743) Resp:  [18-20] 18  (03/22 0743) BP: (108-112)/(80-89) 110/85 mmHg (03/22 0743) SpO2:  [96 %-98 %] 97 % (03/22 0743) Weight:  [142 lb 10.2 oz (64.7 kg)] 142 lb 10.2 oz (64.7 kg) (03/22 0400) Last BM Date: 06/24/11 General: alert and oriented times 3, cachectic! Heart: regular rate and rythm Abdomen: soft, mildly tender, non-distended, normal bowel sounds    Lab Results:  Basename 06/28/11 0450 06/27/11 0610  WBC 9.6 9.7  HGB 10.9* 10.7*  PLT 273 253  MCV 92.1 92.3    Basename 06/28/11 0450 06/27/11 0610  NA 132* 135  K 3.9 4.2  CL 92* 96  CO2 33* 30  GLUCOSE 118* 135*  BUN 12 13  CREATININE 0.67 0.60  CALCIUM 8.4 8.3*    Basename 06/27/11 0838 06/27/11 0610  PROT -- 4.8*  ALBUMIN 2.3* 2.2*  AST -- 25  ALT -- 15  ALKPHOS -- 158*  BILITOT -- 0.4  BILIDIR -- --  IBILI -- --   inr 1.92 on 3/15   Assessment/Plan: 40 y.o. male with infected ascites (culture pending)  I cannot say for certain why he has infected ascites.  He does not have cirrhosis on imaging or on outside liver biopsy but was etoh.abuser (a case of beer a day) until about 2 months ago.  He possibly has ascites from alc hepatitis, this was tapped numerous times at outside hosp and it seemed to my review that he had numerous PMNs from the start (consistent with peritonitis) but I cannot tell that he was ever started on Abx.  Other possibility is infected organ, causing secondary peritonitis but CT does not help with that.  For now, I think  simply keeping him on broad spectrum Abx and seeing how he responds clinically.  He is also on po diuretics to help with ascites control, creatinine is tolerating well.  Dr. Loreta Ave is covering for [facility] this weekend.   Rob Bunting, MD  06/28/2011, 9:30 AM [facility] Pager [phone]

## 2011-06-29 DIAGNOSIS — D649 Anemia, unspecified: Secondary | ICD-10-CM | POA: Diagnosis present

## 2011-06-29 LAB — BASIC METABOLIC PANEL
Calcium: 8.4 mg/dL (ref 8.4–10.5)
GFR calc Af Amer: >90 mL/min (ref >90)
GFR calc non Af Amer: >90 mL/min (ref >90)
Glucose, Bld: 110 mg/dL — ABNORMAL HIGH (ref 70–99)
Potassium: 3.6 mEq/L (ref 3.5–5.1)
Sodium: 132 mEq/L — ABNORMAL LOW (ref 135–145)

## 2011-06-29 LAB — CBC
MCH: 31 pg (ref 26.0–34.0)
MCHC: 33.4 g/dL (ref 30.0–36.0)
Platelets: 278 10*3/uL (ref 150–400)
RDW: 13.7 % (ref 11.5–15.5)

## 2011-06-29 NOTE — Progress Notes (Signed)
Subjective: Abdomen still distended and appetite continues to improve.  No other specific complaints.  Objective: Vital signs in last 24 hours: Filed Vitals:   06/28/11 1945 06/29/11 0200 06/29/11 0500 06/29/11 1104  BP: 115/85 109/85 112/86 125/94  Pulse: 120 108 100 108  Temp: 97 F (36.1 C) 97.5 F (36.4 C) 97.6 F (36.4 C) 96.9 F (36.1 C)  TempSrc: Oral Oral Oral Oral  Resp: 20 20 20 20   Height:      Weight:      SpO2: 95% 97% 96% 98%   Weight change:   Intake/Output Summary (Last 24 hours) at 06/29/11 1325 Last data filed at 06/29/11 0900  Gross per 24 hour  Intake    760 ml  Output      0 ml  Net    760 ml    Physical Exam: General: Awake, Oriented, No acute distress. HEENT: EOMI, poor dentition. Neck: Supple CV: S1 and S2 Lungs: Clear to ascultation bilaterally Abdomen: Tense, less distended, generalized tenderness, +bowel sounds, no guarding. Ext: Good pulses. edema.  Lab Results:  Basename 06/29/11 0500 06/28/11 0450  NA 132* 132*  K 3.6 3.9  CL 92* 92*  CO2 34* 33*  GLUCOSE 110* 118*  BUN 12 12  CREATININE 0.63 0.67  CALCIUM 8.4 8.4  MG -- --  PHOS -- --    Basename 06/27/11 0838 06/27/11 0610  AST -- 25  ALT -- 15  ALKPHOS -- 158*  BILITOT -- 0.4  PROT -- 4.8*  ALBUMIN 2.3* 2.2*    Basename 06/26/11 1524  LIPASE 91*  AMYLASE --    Basename 06/29/11 0500 06/28/11 0450  WBC 7.8 9.6  NEUTROABS -- --  HGB 10.7* 10.9*  HCT 32.0* 32.8*  MCV 92.8 92.1  PLT 278 273   No results found for this basename: CKTOTAL:3,CKMB:3,CKMBINDEX:3,TROPONINI:3 in the last 72 hours No components found with this basename: POCBNP:3 No results found for this basename: DDIMER:2 in the last 72 hours No results found for this basename: HGBA1C:2 in the last 72 hours No results found for this basename: CHOL:2,HDL:2,LDLCALC:2,TRIG:2,CHOLHDL:2,LDLDIRECT:2 in the last 72 hours No results found for this basename: TSH,T4TOTAL,FREET3,T3FREE,THYROIDAB in the last 72  hours No results found for this basename: VITAMINB12:2,FOLATE:2,FERRITIN:2,TIBC:2,IRON:2,RETICCTPCT:2 in the last 72 hours  Micro Results: Recent Results (from the past 240 hour(s))  BODY FLUID CULTURE     Status: Normal (Preliminary result)   Collection Time   06/27/11  9:13 AM      Component Value Range Status Comment   Specimen Description ABDOMEN ASCITIC FLUID   Final    Special Requests   Final    Gram Stain     Final    Value: MODERATE WBC PRESENT, PREDOMINANTLY PMN     NO ORGANISMS SEEN   Culture NO GROWTH 2 DAYS   Final    Report Status PENDING   Incomplete     Studies/Results: Ct Abdomen Pelvis W Contrast  06/27/2011  *RADIOLOGY REPORT*  Clinical Data: Diffuse abdominal pain, ascites which appeared infected at paracentesis, question source; past history DVT, pulmonary embolism, alcoholic hepatitis  CT ABDOMEN AND PELVIS WITH CONTRAST  Technique:  Multidetector CT imaging of the abdomen and pelvis was performed following the standard protocol during bolus administration of intravenous contrast. Sagittal and coronal MPR images reconstructed from axial data set.  Contrast:  Dilute oral contrast.  80 ml Omnipaque-300 IV  Comparison: 06/07/2011  Findings: Bibasilar small pleural effusions with minimal compressive atelectasis of lower lobes. More focal  areas of parenchymal opacity/volume loss are seen at the bases of the right middle and right lower lobes, question atelectasis versus pulmonary infarcts in patient with known large pulmonary emboli at these sites. Liver, spleen, pancreas, kidneys, and adrenal glands normal appearance. Contracted gallbladder.  Significant ascites with newly identified significant peritoneal enhancement highly suggestive of peritonitis. Diffuse dilatation of small bowel loops without evidence of obstruction. Normal appendix. Normal-appearing bladder. No definite mass, adenopathy or hernia. Colon and stomach unremarkable. No acute osseous findings. Bones appear  questionably demineralized.  IMPRESSION: Significant ascites with newly identified peritoneal enhancement diffusely highly suggestive of peritonitis. No focal identifiable cause for peritonitis is identified. Small bibasilar pleural effusions with atelectasis versus infarcts at right lung base.  Original Report Authenticated By: Lollie Marrow, M.D.    Medications: I have reviewed the patient's current medications. Scheduled Meds:    . enoxaparin (LOVENOX) injection  60 mg Subcutaneous Q12H  . famotidine  20 mg Oral BID  . feeding supplement  30 mL Oral BID  . feeding supplement  1 Container Oral TID WC  . folic acid  1 mg Oral Daily  . furosemide  40 mg Oral Daily  . ondansetron  8 mg Oral Q12H  . piperacillin-tazobactam (ZOSYN)  IV  3.375 g Intravenous Q8H  . sodium chloride  3 mL Intravenous Q12H  . spironolactone  100 mg Oral Daily  . thiamine  100 mg Oral Daily   Continuous Infusions:  PRN Meds:.acetaminophen, acetaminophen, alum & mag hydroxide-simeth, diphenhydrAMINE, oxyCODONE, zolpidem  Assessment/Plan: 1. Ascites, no evidence for cirrhosis on imaging or on liver biopsy. Continue furosemide and spironolactone.  Continue protein supplementation; paracentesis performed on 06/25/2011 had 6 L removed.  Repeat paracentesis performed on 06/27/2011, 1.9 L removed.  Ascites fluid on 06/27/2011 showed WBC count of 2193 with 91% neutrophils suggestive of SBP.  Patient started on Cefotaxime on 06/26/2011 was transitioned to Zosyn on 06/27/2011, culture results show no growth to date, if the patient continues to improve consider transitioning to oral antibiotics in 1-2 day.  CT of the abdomen and pelvis obtained on 06/27/2011 which showed significant peritonitis otherwise no focal identifiable cause for peritonitis.  2. Spontaneous bacterial peritonitis.  Management as indicated above.  3. Tachycardia: improved.  Stable.  Telemetry discontinued on March 22 of 2013 as no events were noted on  telemetry.    4. Coagulopathy with recent history of pulmonary embolism, patient is lupus anticoagulant positive.  Continue lovenox per pharmacy for now; plan is for him to be discharged on xarelto.  5. Cachexia/severe protein calorie malnutrition.  Continue supplemental nutrition (resource). Continue prostat bid.   6. Hyponatremia: Stable.    7. GERD, Protonix transitioned to famotidine.  8. Anemia.  Likely due to chronic disease/acute illness.  Hemoglobin stable.  9. Prophylaxis.  Continue Lovenox.  10.  Disposition.  Pending.  If the patient continues to improve consider discharge in 48-72 hours.   LOS: 8 days  Antonio [name] A, MD 06/29/2011, 1:25 PM

## 2011-06-30 LAB — BODY FLUID CULTURE

## 2011-06-30 MED ORDER — POLYETHYLENE GLYCOL 3350 17 G PO PACK
17.0000 g | PACK | Freq: Every day | ORAL | Status: DC | PRN
  Administered 2011-06-30: 17 g via ORAL
  Filled 2011-06-30: qty 1

## 2011-06-30 MED ORDER — RIVAROXABAN 15 MG PO TABS
15.0000 mg | ORAL_TABLET | Freq: Two times a day (BID) | ORAL | Status: DC
  Filled 2011-06-30: qty 1

## 2011-06-30 MED ORDER — DOCUSATE SODIUM 100 MG PO CAPS
100.0000 mg | ORAL_CAPSULE | Freq: Two times a day (BID) | ORAL | Status: DC
  Administered 2011-06-30 – 2011-07-01 (×2): 100 mg via ORAL
  Filled 2011-06-30 (×2): qty 1

## 2011-06-30 MED ORDER — ONDANSETRON HCL 4 MG/2ML IJ SOLN
4.0000 mg | Freq: Four times a day (QID) | INTRAMUSCULAR | Status: DC | PRN
  Administered 2011-06-30: 4 mg via INTRAVENOUS
  Filled 2011-06-30: qty 2

## 2011-06-30 MED ORDER — CIPROFLOXACIN HCL 500 MG PO TABS
500.0000 mg | ORAL_TABLET | Freq: Two times a day (BID) | ORAL | Status: DC
  Administered 2011-06-30 – 2011-07-01 (×2): 500 mg via ORAL
  Filled 2011-06-30 (×5): qty 1

## 2011-06-30 MED ORDER — RIVAROXABAN 15 MG PO TABS
15.0000 mg | ORAL_TABLET | Freq: Two times a day (BID) | ORAL | Status: DC
  Administered 2011-06-30 – 2011-07-01 (×2): 15 mg via ORAL
  Filled 2011-06-30 (×3): qty 1

## 2011-06-30 NOTE — Progress Notes (Signed)
Subjective: Abdominal distention improving.  Appetite slowly improving.  Objective: Vital signs in last 24 hours: Filed Vitals:   06/29/11 1846 06/29/11 2100 06/30/11 0215 06/30/11 0600  BP: 114/88 113/83 119/91 112/86  Pulse: 113 75 108 110  Temp: 97 F (36.1 C) 97.1 F (36.2 C) 96.9 F (36.1 C) 97.4 F (36.3 C)  TempSrc: Oral Oral Oral Oral  Resp: 20 20 20 20   Height:      Weight:      SpO2: 97% 93% 97% 98%   Weight change:   Intake/Output Summary (Last 24 hours) at 06/30/11 0904 Last data filed at 06/30/11 0600  Gross per 24 hour  Intake    635 ml  Output    525 ml  Net    110 ml    Physical Exam: General: Awake, Oriented, No acute distress. HEENT: EOMI, poor dentition. Neck: Supple CV: S1 and S2 Lungs: Clear to ascultation bilaterally Abdomen: Tense, less distended, generalized tenderness, +bowel sounds, no guarding. Ext: Good pulses. edema.  Lab Results:  Basename 06/29/11 0500 06/28/11 0450  NA 132* 132*  K 3.6 3.9  CL 92* 92*  CO2 34* 33*  GLUCOSE 110* 118*  BUN 12 12  CREATININE 0.63 0.67  CALCIUM 8.4 8.4  MG -- --  PHOS -- --   No results found for this basename: AST:2,ALT:2,ALKPHOS:2,BILITOT:2,PROT:2,ALBUMIN:2 in the last 72 hours No results found for this basename: LIPASE:2,AMYLASE:2 in the last 72 hours  Basename 06/29/11 0500 06/28/11 0450  WBC 7.8 9.6  NEUTROABS -- --  HGB 10.7* 10.9*  HCT 32.0* 32.8*  MCV 92.8 92.1  PLT 278 273   No results found for this basename: CKTOTAL:3,CKMB:3,CKMBINDEX:3,TROPONINI:3 in the last 72 hours No components found with this basename: POCBNP:3 No results found for this basename: DDIMER:2 in the last 72 hours No results found for this basename: HGBA1C:2 in the last 72 hours No results found for this basename: CHOL:2,HDL:2,LDLCALC:2,TRIG:2,CHOLHDL:2,LDLDIRECT:2 in the last 72 hours No results found for this basename: TSH,T4TOTAL,FREET3,T3FREE,THYROIDAB in the last 72 hours No results found for this  basename: VITAMINB12:2,FOLATE:2,FERRITIN:2,TIBC:2,IRON:2,RETICCTPCT:2 in the last 72 hours  Micro Results: Recent Results (from the past 240 hour(s))  BODY FLUID CULTURE     Status: Normal (Preliminary result)   Collection Time   06/27/11  9:13 AM      Component Value Range Status Comment   Specimen Description ABDOMEN ASCITIC FLUID   Final    Special Requests   Final    Gram Stain     Final    Value: MODERATE WBC PRESENT, PREDOMINANTLY PMN     NO ORGANISMS SEEN   Culture NO GROWTH 2 DAYS   Final    Report Status PENDING   Incomplete     Studies/Results: No results found.  Medications: I have reviewed the patient's current medications. Scheduled Meds:    . enoxaparin (LOVENOX) injection  60 mg Subcutaneous Q12H  . famotidine  20 mg Oral BID  . feeding supplement  30 mL Oral BID  . feeding supplement  1 Container Oral TID WC  . folic acid  1 mg Oral Daily  . furosemide  40 mg Oral Daily  . ondansetron  8 mg Oral Q12H  . piperacillin-tazobactam (ZOSYN)  IV  3.375 g Intravenous Q8H  . sodium chloride  3 mL Intravenous Q12H  . spironolactone  100 mg Oral Daily  . thiamine  100 mg Oral Daily   Continuous Infusions:  PRN Meds:.acetaminophen, acetaminophen, alum & mag hydroxide-simeth, diphenhydrAMINE, ondansetron (  ZOFRAN) IV, oxyCODONE, zolpidem  Assessment/Plan: 1. Ascites, no evidence for cirrhosis on imaging or on liver biopsy. Continue furosemide and spironolactone.  Continue protein supplementation; paracentesis performed on 06/25/2011 had 6 L removed.  Repeat paracentesis performed on 06/27/2011, 1.9 L removed.  Ascites fluid on 06/27/2011 showed WBC count of 2193 with 91% neutrophils suggestive of SBP.  Patient started on Cefotaxime on 06/26/2011 was transitioned to Zosyn on 06/27/2011, culture results show no growth to date.  CT of the abdomen and pelvis obtained on 06/27/2011 which showed significant peritonitis otherwise no focal identifiable cause for peritonitis.  Given the patient continued to improve antibiotics were transitioned to ciprofloxacin on 06/30/2011.    2. Spontaneous bacterial peritonitis.  Management as indicated above.  3. Tachycardia. Improved.  Stable.  Telemetry discontinued on March 22 of 2013 as no events were noted on telemetry.    4. Coagulopathy with recent history of pulmonary embolism, patient is lupus anticoagulant positive.  Transition lovenox back to Rivaroxaban.  5. Cachexia/severe protein calorie malnutrition.  Continue supplemental nutrition (resource). Continue prostat bid.   6. Hyponatremia: Stable.    7. GERD, Protonix transitioned to famotidine.  8. Anemia.  Likely due to chronic disease/acute illness.  Hemoglobin stable.  9. Prophylaxis.  Transitioned from Lovenox to Rivaroxaban.  10.  Disposition.  Pending.  If the patient continues to improve consider discharge in 24 hours.   LOS: 9 days  Finlee Concepcion A, MD 06/30/2011, 9:04 AM

## 2011-06-30 NOTE — Progress Notes (Signed)
ANTICOAGULATION CONSULT NOTE - Follow Up Consult  Pharmacy Consult for Lovenox Indication: pulmonary embolus  No Known Allergies  Patient Measurements: Height: 6\' 1"  (185.4 cm) Weight: 142 lb 10.2 oz (64.7 kg) IBW/kg (Calculated) : 79.9  Heparin Dosing Weight: 65 kg  Vital Signs: Temp: 97.4 F (36.3 C) (03/24 0600) Temp src: Oral (03/24 0600) BP: 112/86 mmHg (03/24 0600) Pulse Rate: 110  (03/24 0600)  Labs:  Basename 06/29/11 0500 06/28/11 0450  HGB 10.7* 10.9*  HCT 32.0* 32.8*  PLT 278 273  APTT -- --  LABPROT -- --  INR -- --  HEPARINUNFRC -- --  CREATININE 0.63 0.67  CKTOTAL -- --  CKMB -- --  TROPONINI -- --   Estimated Creatinine Clearance: 114.6 ml/min (by C-G formula based on Cr of 0.63).  Assessment: 41 yo M with recent diagnosis of PE and + lupus anticoagulant at [facility]. Patient continues on treatment dose Lovenox with plans to transition to Xarelto at discharge.  CBC and Scr remain stable.  Goal of therapy: full anticoagulation  Plan:  Continue Lovenox 60 mg SQ q12hrs. Cala Bradford Laysa Kimmey, Pharm.D., BCPS Clinical Pharmacist Pager [phone] 06/30/2011 8:02 AM

## 2011-07-01 ENCOUNTER — Telehealth (HOSPITAL_COMMUNITY): Payer: Self-pay

## 2011-07-01 LAB — BASIC METABOLIC PANEL
CO2: 34 mEq/L — ABNORMAL HIGH (ref 19–32)
Calcium: 8.7 mg/dL (ref 8.4–10.5)
GFR calc non Af Amer: >90 mL/min (ref >90)
Potassium: 4.2 mEq/L (ref 3.5–5.1)
Sodium: 132 mEq/L — ABNORMAL LOW (ref 135–145)

## 2011-07-01 LAB — CBC
MCH: 30.6 pg (ref 26.0–34.0)
Platelets: 351 10*3/uL (ref 150–400)
RBC: 3.69 MIL/uL — ABNORMAL LOW (ref 4.22–5.81)

## 2011-07-01 MED ORDER — CIPROFLOXACIN HCL 500 MG PO TABS: ORAL_TABLET | ORAL | Status: DC

## 2011-07-01 MED ORDER — SPIRONOLACTONE 100 MG PO TABS: 100.0000 mg | ORAL_TABLET | Freq: Every day | ORAL | Status: DC

## 2011-07-01 MED ORDER — BOOST / RESOURCE BREEZE PO LIQD: 1.0000 | Freq: Three times a day (TID) | ORAL | Status: DC

## 2011-07-01 MED ORDER — FUROSEMIDE 40 MG PO TABS: 40.0000 mg | ORAL_TABLET | Freq: Every day | ORAL | Status: DC

## 2011-07-01 MED ORDER — FAMOTIDINE 20 MG PO TABS: 20.0000 mg | ORAL_TABLET | Freq: Two times a day (BID) | ORAL | Status: DC

## 2011-07-01 MED ORDER — THIAMINE HCL 100 MG PO TABS: 100.0000 mg | ORAL_TABLET | Freq: Every day | ORAL | Status: DC

## 2011-07-01 MED ORDER — RIVAROXABAN 15 MG PO TABS: 15.0000 mg | ORAL_TABLET | Freq: Every day | ORAL | Status: DC

## 2011-07-01 MED ORDER — FOLIC ACID 1 MG PO TABS: 1.0000 mg | ORAL_TABLET | Freq: Every day | ORAL | Status: DC

## 2011-07-01 NOTE — Progress Notes (Signed)
Physical Therapy Treatment Patient Details Name: Antonio [name] MRN: [id number] DOB: 1970/08/01 Today's Date: 07/01/2011  PT Assessment/Plan  PT - Assessment/Plan Comments on Treatment Session: Pt independent with mobility.  No further PT needed. PT Plan: Discharge plan remains appropriate;All goals met and education completed, patient dischaged from PT services Follow Up Recommendations: No PT follow up Equipment Recommended: None recommended by PT PT Goals  Acute Rehab PT Goals PT Goal: Supine/Side to Sit - Progress: Met PT Goal: Sit to Supine/Side - Progress: Met PT Goal: Sit to Stand - Progress: Met PT Goal: Stand to Sit - Progress: Met PT Transfer Goal: Bed to Chair/Chair to Bed - Progress: Met PT Goal: Up/Down Stairs - Progress: Partly met  PT Treatment Precautions/Restrictions  Precautions Precautions: Fall Restrictions Weight Bearing Restrictions: No Mobility (including Balance) Bed Mobility Supine to Sit: 7: Independent Sitting - Scoot to Edge of Bed: 7: Independent Sit to Supine: 7: Independent Transfers Sit to Stand: 7: Independent Stand to Sit: 7: Independent Ambulation/Gait Ambulation/Gait Assistance: 7: Independent Ambulation Distance (Feet): 300 Feet Assistive device: None Gait Pattern: Within Functional Limits Stairs: Yes Stairs Assistance: 5: Supervision Stair Management Technique: One rail Left Number of Stairs: 3     Exercise    End of Session PT - End of Session Activity Tolerance: Patient tolerated treatment well Patient left: in bed;with call bell in reach General Behavior During Session: [facility] for tasks performed Cognition: [facility] for tasks performed  [facility] 07/01/2011, 2:14 PM  [employer] PT [phone]

## 2011-07-01 NOTE — Progress Notes (Signed)
Subjective: Patient reports having good appetite. Feeling well.  Objective: Vital signs in last 24 hours: Filed Vitals:   07/01/11 0253 07/01/11 0533 07/01/11 0824 07/01/11 1318  BP: 113/87 114/90 100/78 119/95  Pulse: 111 110 108 95  Temp: 97.6 F (36.4 C) 97.1 F (36.2 C) 96.3 F (35.7 C) 98.2 F (36.8 C)  TempSrc: Oral Oral Tympanic Oral  Resp: 20 20 20 20   Height:      Weight:      SpO2: 98% 97% 97% 98%   Weight change:   Intake/Output Summary (Last 24 hours) at 07/01/11 1320 Last data filed at 07/01/11 0345  Gross per 24 hour  Intake    490 ml  Output   1600 ml  Net  -1110 ml    Physical Exam: General: Awake, Oriented, No acute distress. HEENT: EOMI, poor dentition. Neck: Supple CV: S1 and S2 Lungs: Clear to ascultation bilaterally Abdomen: Tense, less distended, generalized tenderness, +bowel sounds, no guarding. Ext: Good pulses. edema.  Lab Results:  Basename 07/01/11 0725 06/29/11 0500  NA 132* 132*  K 4.2 3.6  CL 90* 92*  CO2 34* 34*  GLUCOSE 129* 110*  BUN 12 12  CREATININE 0.62 0.63  CALCIUM 8.7 8.4  MG -- --  PHOS -- --   No results found for this basename: AST:2,ALT:2,ALKPHOS:2,BILITOT:2,PROT:2,ALBUMIN:2 in the last 72 hours No results found for this basename: LIPASE:2,AMYLASE:2 in the last 72 hours  Basename 07/01/11 0725 06/29/11 0500  WBC 10.0 7.8  NEUTROABS -- --  HGB 11.3* 10.7*  HCT 34.3* 32.0*  MCV 93.0 92.8  PLT 351 278   No results found for this basename: CKTOTAL:3,CKMB:3,CKMBINDEX:3,TROPONINI:3 in the last 72 hours No components found with this basename: POCBNP:3 No results found for this basename: DDIMER:2 in the last 72 hours No results found for this basename: HGBA1C:2 in the last 72 hours No results found for this basename: CHOL:2,HDL:2,LDLCALC:2,TRIG:2,CHOLHDL:2,LDLDIRECT:2 in the last 72 hours No results found for this basename: TSH,T4TOTAL,FREET3,T3FREE,THYROIDAB in the last 72 hours No results found for this  basename: VITAMINB12:2,FOLATE:2,FERRITIN:2,TIBC:2,IRON:2,RETICCTPCT:2 in the last 72 hours  Micro Results: Recent Results (from the past 240 hour(s))  BODY FLUID CULTURE     Status: Normal   Collection Time   06/27/11  9:13 AM      Component Value Range Status Comment   Specimen Description ABDOMEN ASCITIC FLUID   Final    Special Requests   Final    Gram Stain     Final    Value: MODERATE WBC PRESENT, PREDOMINANTLY PMN     NO ORGANISMS SEEN   Culture NO GROWTH 3 DAYS   Final    Report Status 06/30/2011 FINAL   Final     Studies/Results: No results found.  Medications: I have reviewed the patient's current medications. Scheduled Meds:    . ciprofloxacin  500 mg Oral BID  . docusate sodium  100 mg Oral BID  . famotidine  20 mg Oral BID  . feeding supplement  30 mL Oral BID  . feeding supplement  1 Container Oral TID WC  . folic acid  1 mg Oral Daily  . furosemide  40 mg Oral Daily  . ondansetron  8 mg Oral Q12H  . rivaroxaban  15 mg Oral BID  . sodium chloride  3 mL Intravenous Q12H  . spironolactone  100 mg Oral Daily  . thiamine  100 mg Oral Daily  . DISCONTD: rivaroxaban  15 mg Oral BID   Continuous Infusions:  PRN Meds:.acetaminophen, acetaminophen, alum & mag hydroxide-simeth, diphenhydrAMINE, ondansetron (ZOFRAN) IV, oxyCODONE, polyethylene glycol, zolpidem  Assessment/Plan: 1. Ascites, no evidence for cirrhosis on imaging or on liver biopsy. Continue furosemide and spironolactone.  Continue protein supplementation; paracentesis performed on 06/25/2011 had 6 L removed.  Repeat paracentesis performed on 06/27/2011, 1.9 L removed.  Ascites fluid on 06/27/2011 showed WBC count of 2193 with 91% neutrophils suggestive of SBP.  Patient started on Cefotaxime on 06/26/2011 was transitioned to Zosyn on 06/27/2011, culture results show no growth to date.  CT of the abdomen and pelvis obtained on 06/27/2011 which showed significant peritonitis otherwise no focal identifiable  cause for peritonitis. Given the patient continued to improve antibiotics were transitioned to ciprofloxacin on 06/30/2011.  Cipro 500 bid for 7 days then once day for ~3 months.  2. Spontaneous bacterial peritonitis.  Management as indicated above.  3. Tachycardia. Improved.  Stable.  Telemetry discontinued on March 22 of 2013 as no events were noted on telemetry.    4. Coagulopathy with recent history of pulmonary embolism, patient is lupus anticoagulant positive.  Transition lovenox back to Rivaroxaban on 06/30/2011.  5. Cachexia/severe protein calorie malnutrition.  Continue supplemental nutrition (resource). Continue prostat bid.   6. Hyponatremia: Stable.    7. GERD, Protonix transitioned to famotidine.  8. Anemia.  Likely due to chronic disease/acute illness.  Hemoglobin stable.  9. Prophylaxis.  Transitioned from Lovenox to Rivaroxaban.  10.  Disposition.  Discharge patient home today.   LOS: 10 days  Yaniyah Koors A, MD 07/01/2011, 1:20 PM

## 2011-07-01 NOTE — Discharge Summary (Addendum)
Discharge Summary  Antonio [name] MR#: [id number]  DOB:[birth year]  Date of Admission: 06/21/2011 Date of Discharge: 07/01/2011  Patient's PCP: Berlinda Last, DO  Patient's GI: Dr. Jennye Boroughs  Attending Physician:Shafin Pollio A  Consults: Dr. Christella Hartigan, GI  Discharge Diagnoses: Principal Problem:  *Ascites Active Problems:  Tachycardia  Coagulopathy  Cachexia  Hyponatremia  Failure to thrive  SBP (spontaneous bacterial peritonitis)  Anemia   Brief Admitting History and Physical 41 year old male with history of alcohol abuse and recent hospitalization at [facility] presents with ascites on 06/22/2011.  Discharge Medications Medication List  As of 07/01/2011  1:37 PM   TAKE these medications         ciprofloxacin 500 MG tablet   Commonly known as: CIPRO   500 mg oral twice daily for 7 days then daily there after for 3 months.      famotidine 20 MG tablet   Commonly known as: PEPCID   Take 1 tablet (20 mg total) by mouth 2 (two) times daily.      feeding supplement Liqd   Take 1 Container by mouth 3 (three) times daily with meals.      folic acid 1 MG tablet   Commonly known as: FOLVITE   Take 1 tablet (1 mg total) by mouth daily.      furosemide 40 MG tablet   Commonly known as: LASIX   Take 1 tablet (40 mg total) by mouth daily.      omeprazole 20 MG capsule   Commonly known as: PRILOSEC   Take 20 mg by mouth 2 (two) times daily.      oxyCODONE 5 MG immediate release tablet   Commonly known as: Oxy IR/ROXICODONE   Take 5 mg by mouth every 4 (four) hours as needed.      prenatal vitamin w/FE, FA 27-1 MG Tabs   Take 1 tablet by mouth daily.      promethazine 12.5 MG tablet   Commonly known as: PHENERGAN   Take 12.5 mg by mouth every 6 (six) hours as needed. For nausea      Rivaroxaban 15 MG Tabs tablet   Commonly known as: XARELTO   Take 1 tablet (15 mg total) by mouth daily.      spironolactone 100 MG tablet   Commonly known as:  ALDACTONE   Take 1 tablet (100 mg total) by mouth daily.      thiamine 100 MG tablet   Take 1 tablet (100 mg total) by mouth daily.            Hospital Course: 1. Ascites, no evidence for cirrhosis on imaging or on liver biopsy.  Patient was started on furosemide and spironolactone dose increased. Continue protein supplementation; paracentesis performed on 06/25/2011 had 6 L removed. Repeat paracentesis performed on 06/27/2011, 1.9 L removed. Ascites fluid on 06/27/2011 showed WBC count of 2193 with 91% neutrophils suggestive of SBP. Patient started on Cefotaxime on 06/26/2011 was transitioned to Zosyn on 06/27/2011, culture results show no growth to date. CT of the abdomen and pelvis obtained on 06/27/2011 which showed significant peritonitis otherwise no focal identifiable cause for peritonitis. Given the patient continued to improve antibiotics were transitioned to ciprofloxacin on 06/30/2011.  Discussed with gastroenterology, recommended continuing ciprofloxacin 500 mg bid for 7 days then once day for ~3 months.   Records from [facility] reviewed, liver biopsy on 06/18/2011 showed mild periportal fibrosis, scant fragments of hepatic parenchyma, macrovesicular steatosis present. Patient has had a long complicated hospital course from  06/04/2011 till 06/19/2011. Patient was found to have a very emboli was initially started on Lovenox and Coumadin which was transitioned to Rivaroxaban. The patient had rapid accumulation of abdominal ascites and has had a least 2 paracentesis during the course of hospital stay. The patient had poor by mouth intake and had feeding tube placed however he was not tolerating the feeding tube as a result was discontinued in favor of oral supplemental nutrition. Patient's GI is Dr. Jennye Boroughs.  2. Spontaneous bacterial peritonitis. Management as indicated above.   3. Tachycardia. Improved. Stable. Telemetry discontinued on March 22 of 2013 as no events  were noted on telemetry.   4. Coagulopathy with recent history of pulmonary embolism, patient is lupus anticoagulant positive. Transition lovenox back to Rivaroxaban on 06/30/2011.  Discussed with pharmacy regarding Rivaroxaban dose, pharmacy recommended continuing at 15 mg daily.  5. Cachexia/severe protein calorie malnutrition. Continue supplemental nutrition (resource). Continue prostat bid.   6. Hyponatremia: Stable.   7. GERD, Protonix transitioned to famotidine.   8. Anemia. Likely due to chronic disease/acute illness. Hemoglobin stable.  Day of Discharge BP 119/95  Pulse 95  Temp(Src) 98.2 F (36.8 C) (Oral)  Resp 20  Ht 6\' 1"  (1.854 m)  Wt 64.7 kg (142 lb 10.2 oz)  BMI 18.82 kg/m2  SpO2 98%  Results for orders placed during the hospital encounter of 06/21/11 (from the past 48 hour(s))  CBC     Status: Abnormal   Collection Time   07/01/11  7:25 AM      Component Value Range Comment   WBC 10.0  4.0 - 10.5 (K/uL)    RBC 3.69 (*) 4.22 - 5.81 (MIL/uL)    Hemoglobin 11.3 (*) 13.0 - 17.0 (g/dL)    HCT 96.0 (*) 45.4 - 52.0 (%)    MCV 93.0  78.0 - 100.0 (fL)    MCH 30.6  26.0 - 34.0 (pg)    MCHC 32.9  30.0 - 36.0 (g/dL)    RDW 09.8  11.9 - 14.7 (%)    Platelets 351  150 - 400 (K/uL)   BASIC METABOLIC PANEL     Status: Abnormal   Collection Time   07/01/11  7:25 AM      Component Value Range Comment   Sodium 132 (*) 135 - 145 (mEq/L)    Potassium 4.2  3.5 - 5.1 (mEq/L)    Chloride 90 (*) 96 - 112 (mEq/L)    CO2 34 (*) 19 - 32 (mEq/L)    Glucose, Bld 129 (*) 70 - 99 (mg/dL)    BUN 12  6 - 23 (mg/dL)    Creatinine, Ser 8.29  0.50 - 1.35 (mg/dL)    Calcium 8.7  8.4 - 10.5 (mg/dL)    GFR calc non Af Amer >90  >90 (mL/min)    GFR calc Af Amer >90  >90 (mL/min)     Ct Abdomen Pelvis W Contrast  06/27/2011  *RADIOLOGY REPORT*  Clinical Data: Diffuse abdominal pain, ascites which appeared infected at paracentesis, question source; past history DVT, pulmonary embolism,  alcoholic hepatitis  CT ABDOMEN AND PELVIS WITH CONTRAST  Technique:  Multidetector CT imaging of the abdomen and pelvis was performed following the standard protocol during bolus administration of intravenous contrast. Sagittal and coronal MPR images reconstructed from axial data set.  Contrast:  Dilute oral contrast.  80 ml Omnipaque-300 IV  Comparison: 06/07/2011  Findings: Bibasilar small pleural effusions with minimal compressive atelectasis of lower lobes. More focal areas of parenchymal opacity/volume loss  are seen at the bases of the right middle and right lower lobes, question atelectasis versus pulmonary infarcts in patient with known large pulmonary emboli at these sites. Liver, spleen, pancreas, kidneys, and adrenal glands normal appearance. Contracted gallbladder.  Significant ascites with newly identified significant peritoneal enhancement highly suggestive of peritonitis. Diffuse dilatation of small bowel loops without evidence of obstruction. Normal appendix. Normal-appearing bladder. No definite mass, adenopathy or hernia. Colon and stomach unremarkable. No acute osseous findings. Bones appear questionably demineralized.  IMPRESSION: Significant ascites with newly identified peritoneal enhancement diffusely highly suggestive of peritonitis. No focal identifiable cause for peritonitis is identified. Small bibasilar pleural effusions with atelectasis versus infarcts at right lung base.  Original Report Authenticated By: Lollie Marrow, M.D.   US Paracentesis  06/28/2011  *RADIOLOGY REPORT*  Clinical Data: Abdominal ascites; cirrhosis  ULTRASOUND GUIDED PARACENTESIS  Comparison:  Previous paracentesis  An ultrasound guided paracentesis was thoroughly discussed with the patient and questions answered.  The benefits, risks, alternatives and complications were also discussed.  The patient understands and wishes to proceed with the procedure.  Written consent was obtained.  Ultrasound was performed to  localize and mark an adequate pocket of fluid in the right lower quadrant of the abdomen.  The area was then prepped and draped in the normal sterile fashion.  1% Lidocaine was used for local anesthesia.  Under ultrasound guidance a 19 gauge Yueh catheter was introduced.  Paracentesis was performed.  The catheter was removed and a dressing applied.  Complications:  None  Findings:  A total of approximately 1.9 liters of dark brown fluid was removed.  A fluid sample was sent for laboratory analysis.  IMPRESSION: Successful ultrasound guided paracentesis yielding 1.9 liters of ascites.  Read by: Ralene Muskrat, P.A.-C  Original Report Authenticated By: Judie Petit. Ruel Favors, M.D.   US Paracentesis  06/25/2011  *RADIOLOGY REPORT*  Clinical Data: Abdominal ascites; cirrhosis  ULTRASOUND GUIDED PARACENTESIS  Comparison:  Previous paracentesis  An ultrasound guided paracentesis was thoroughly discussed with the patient and questions answered.  The benefits, risks, alternatives and complications were also discussed.  The patient understands and wishes to proceed with the procedure.  Written consent was obtained.  Ultrasound was performed to localize and mark an adequate pocket of fluid in the left lower quadrant of the abdomen.  The area was then prepped and draped in the normal sterile fashion.  1% Lidocaine was used for local anesthesia.  Under ultrasound guidance a 19 gauge Yueh catheter was introduced.  Paracentesis was performed.  The catheter was removed and a dressing applied.  Complications:  None  Findings:  A total of approximately 6 liters of dark brown fluid was removed.  A fluid sample was not sent for laboratory analysis.  IMPRESSION: Successful ultrasound guided paracentesis yielding 6 liters of ascites.  Read by: Ralene Muskrat, P.A.-C  Original Report Authenticated By: Richarda Overlie, M.D.   Dg Chest Portable 1 View  06/21/2011  *RADIOLOGY REPORT*  Clinical Data: Fatigue, tachycardia, cough  PORTABLE CHEST - 1  VIEW  Comparison: 06/03/2011  Findings: Cardiomediastinal silhouette is stable.  Study is limited by poor inspiration.  Mild right basilar atelectasis. Improvement in aeration in the right lower lobe with decreasing size of the right lateral focal infiltrate measures about 1.2 cm.  Follow-up examination to assure complete resolution is recommended.  No new infiltrate or pulmonary edema.  IMPRESSION:  Study is limited by poor inspiration.  Mild right basilar atelectasis. Improvement in aeration in the  right lower lobe with decreasing size of the right lateral focal infiltrate measures about 1.2 cm.  Follow-up examination to assure complete resolution is recommended.  No new infiltrate or pulmonary edema.  Original Report Authenticated By: Natasha Mead, M.D.   Disposition: Home with home health RN  Diet: Low-sodium diet  Activity: Resume as tolerated   Follow-up Appts: Discharge Orders    Future Orders Please Complete By Expires   Diet - low sodium heart healthy      Increase activity slowly      Discharge instructions      Comments:   Followup with Dr. Jennye Boroughs (GI) as previously scheduled on 07/04/2011.      TESTS THAT NEED FOLLOW-UP None  Time spent on discharge, talking to the patient, and coordinating care: 35 mins.   Signed: Cristal Ford, MD 07/01/2011, 1:37 PM

## 2011-07-01 NOTE — Progress Notes (Signed)
     [facility] Daily Rounding Note 07/01/2011, 10:23 AM  SUBJECTIVE:       Less pain, constipated with 4 times daily oxycodone.  Appetite improving, tolerating solids.   OBJECTIVE:        General: looks unwell, non- toxic     Vital signs in last 24 hours:    Temp:  [96.3 F (35.7 C)-97.6 F (36.4 C)] 96.3 F (35.7 C) (03/25 0824) Pulse Rate:  [107-113] 108  (03/25 0824) Resp:  [20] 20  (03/25 0824) BP: (100-119)/(78-98) 100/78 mmHg (03/25 0824) SpO2:  [96 %-98 %] 97 % (03/25 0824) Last BM Date: 06/26/11  Heart: RRR Chest: Clear B.  No dyspnea Abdomen: less distended, slightly tender, diffusely.    Extremities:  Pedal edema improved, not resolved.  Neuro/Psych:  Pleasant , not confused or agitated.   Intake/Output from previous day: 03/24 0701 - 03/25 0700 In: 730 [P.O.:730] Out: 1600 [Urine:1600]  Intake/Output this shift:    Lab Results:  Basename 07/01/11 0725 06/29/11 0500  WBC 10.0 7.8  HGB 11.3* 10.7*  HCT 34.3* 32.0*  PLT 351 278   BMET  Basename 07/01/11 0725 06/29/11 0500  NA 132* 132*  K 4.2 3.6  CL 90* 92*  CO2 34* 34*  GLUCOSE 129* 110*  BUN 12 12  CREATININE 0.62 0.63  CALCIUM 8.7 8.4   ASSESMENT: 1.  Ascites unclear etiology.  No cirrhosis on imaging or liver biopsy at [facility].  On aldactone 100, lasix 40 mg daily.  BUN/Creat stable.  Multiple paracentesis since last month, totalling about 27 liters.   2.  SBP.  Abdominal pain improved with ABX, day 5.  Current abx is PO cipro 3.  B DVT and PE.  Started Xarelto at [facility].    PLAN: 1.  Daily weights. Last weight was on 3/22 2.  Will sign off.  Chronic cipro.  GI follow up with DR timothy Meisenheimer.   LOS: 10 days   Jennye Moccasin  07/01/2011, 10:23 AM Pager: [phone]

## 2011-07-01 NOTE — Progress Notes (Signed)
Occupational Therapy Treatment Patient Details Name: Antonio [name] MRN: [id number] DOB: Sep 06, 1970 Today's Date: 07/01/2011  OT Assessment/Plan OT Assessment/Plan Comments on Treatment Session: Pt performing ADL at an independent level except stepping over edge of tub for which he needs supervision for safety.  OT goals met.  No OT needs post hospitalization or DME. OT Plan: Discharge plan needs to be updated Follow Up Recommendations: No OT follow up;Supervision - Intermittent Equipment Recommended: None recommended by OT OT Goals Acute Rehab OT Goals OT Goal Formulation: With patient ADL Goals Pt Will Perform Grooming: with modified independence;Standing at sink ADL Goal: Grooming - Progress: Met Pt Will Perform Lower Body Bathing: with modified independence;Standing at sink;Sitting at sink ADL Goal: Lower Body Bathing - Progress: Met Pt Will Perform Lower Body Dressing: with modified independence;Sitting, bed;Sit to stand from bed ADL Goal: Lower Body Dressing - Progress: Met Pt Will Transfer to Toilet: with modified independence;Ambulation;Regular height toilet ADL Goal: Toilet Transfer - Progress: Met Pt Will Perform Toileting - Clothing Manipulation: Independently;Standing ADL Goal: Toileting - Clothing Manipulation - Progress: Met Pt Will Perform Toileting - Hygiene: Independently;Leaning right and/or left on 3-in-1/toilet ADL Goal: Toileting - Hygiene - Progress: Met Pt Will Perform Tub/Shower Transfer: Tub transfer;with modified independence;Ambulation;Other (comment) ADL Goal: Tub/Shower Transfer - Progress: Met Miscellaneous OT Goals Miscellaneous OT Goal #1: Pt will generalize energy conservation techniques in ADL with min cues. OT Goal: Miscellaneous Goal #1 - Progress: Met  OT Treatment Precautions/Restrictions  Precautions Precautions: Fall   ADL ADL Grooming: Performed;Wash/dry hands;Teeth care;Brushing hair;Independent Where Assessed - Grooming: Standing at  sink Lower Body Bathing: Simulated;Independent Lower Body Bathing Details (indicate cue type and reason): crosses foot over opposite knee Where Assessed - Lower Body Bathing: Sitting at sink;Sit to stand from chair Lower Body Dressing: Performed;Independent Lower Body Dressing Details (indicate cue type and reason): crosses opposite foot over knee Where Assessed - Lower Body Dressing: Sitting, bed;Sit to stand from bed Toilet Transfer: Performed;Independent Toilet Transfer Method: Proofreader: Regular height toilet;Grab bars Toileting - Clothing Manipulation: Simulated;Independent Where Assessed - Toileting Clothing Manipulation: Standing Tub/Shower Transfer: Simulated;Supervision/safety (simulated stepping over garbage can) Tub/Shower Transfer Details (indicate cue type and reason): recommended pt use resin chair he already owns in tub to sit on during shower due to low standing endurance, pt requesting to shower prior to d/c today--RN notified Tub/Shower Transfer Method: Ambulating Ambulation Related to ADLs: Ambulating independently in room, some steadying himself noted on furniture, bathroom door and sink. ADL Comments: Pt now able to access feet for ADL now that abdominal distention is reduced.  All goals are met.  No further OT needs. Mobility  Bed Mobility Bed Mobility: Yes Supine to Sit: 7: Independent Sitting - Scoot to Edge of Bed: 7: Independent Sit to Supine: 7: Independent Transfers Transfers: Yes Sit to Stand: 7: Independent Stand to Sit: 7: Independent End of Session OT - End of Session Equipment Utilized During Treatment: Gait belt Activity Tolerance: Patient tolerated treatment well Patient left: in bed;with call bell in reach Nurse Communication: Other (comment) (pt requesting to shower prior to d/c home today) General Behavior During Session: [facility] for tasks performed Cognition: [facility] for tasks performed  Evern Bio  07/01/2011,  2:43 PM [phone]

## 2011-07-01 NOTE — Progress Notes (Signed)
   CARE MANAGEMENT NOTE 07/01/2011  Patient:  Antonio [name], Antonio [name]   Account Number:  [id number]  Date Initiated:  06/24/2011  Documentation initiated by:  Junius Creamer  Subjective/Objective Assessment:   adm w tachycardia     Action/Plan:   lives w fam, no pcp listed. pt from [employer].   Anticipated DC Date:  06/26/2011   Anticipated DC Plan:  HOME W HOME HEALTH SERVICES  In-house referral  Clinical Social Worker      DC Planning Services  CM consult  Follow-up appt scheduled  Medication Assistance      [facility] Choice  Resumption Of Svcs/PTA Provider    HH arranged  HH-1 RN      [facility] agency  [facility] HEALTH   Status of service:  Completed, signed off  Discharge Disposition:  HOME W HOME HEALTH SERVICES  If discussed at [employer] of Stay Meetings, dates discussed:    Comments:  07/01/11 1200 Jannetta Massey RN MSN CCM Pt to be d/c'd today, states 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  Aware of appt with Dr. Clovis Riley on 3/28. Eligible for ZZ funds, pharmacy will provide 7 day supply of Cipro.  Per pt, he filled Rx for Xarelto when he d/c'd from [facility] and has completed pt assistance program application.  Provided pt with medication discount card. TC to [facility], faxed d/c summary as requested.  3/20 spoke w [facility], pt was to see dr Clovis Riley in [place] [phone], appt rescehed for 3/28 at 11am. debbie dowell rn,bsn [phone] 3/18 sw consult for etoh co.  spoke w pt and friends. was act w [facility] home health pta. debbie dowell rn,bsn [phone]

## 2011-07-09 ENCOUNTER — Inpatient Hospital Stay (HOSPITAL_COMMUNITY): Payer: Medicaid Other

## 2011-07-09 ENCOUNTER — Emergency Department (HOSPITAL_COMMUNITY): Payer: Medicaid Other

## 2011-07-09 ENCOUNTER — Inpatient Hospital Stay (HOSPITAL_COMMUNITY)
Admission: EM | Admit: 2011-07-09 | Discharge: 2011-07-13 | DRG: 371 | Disposition: A | Discharge status: Home Health | Payer: Medicaid Other | Source: Ambulatory Visit | Attending: Internal Medicine | Admitting: Internal Medicine

## 2011-07-09 ENCOUNTER — Encounter (HOSPITAL_COMMUNITY): Payer: Self-pay | Admitting: Emergency Medicine

## 2011-07-09 DIAGNOSIS — E871 Hypo-osmolality and hyponatremia: Secondary | ICD-10-CM

## 2011-07-09 DIAGNOSIS — K658 Other peritonitis: Principal | ICD-10-CM | POA: Diagnosis present

## 2011-07-09 DIAGNOSIS — Z86711 Personal history of pulmonary embolism: Secondary | ICD-10-CM

## 2011-07-09 DIAGNOSIS — K5900 Constipation, unspecified: Secondary | ICD-10-CM | POA: Diagnosis present

## 2011-07-09 DIAGNOSIS — F17200 Nicotine dependence, unspecified, uncomplicated: Secondary | ICD-10-CM | POA: Diagnosis present

## 2011-07-09 DIAGNOSIS — D689 Coagulation defect, unspecified: Secondary | ICD-10-CM

## 2011-07-09 DIAGNOSIS — E8779 Other fluid overload: Secondary | ICD-10-CM | POA: Diagnosis present

## 2011-07-09 DIAGNOSIS — K7460 Unspecified cirrhosis of liver: Secondary | ICD-10-CM

## 2011-07-09 DIAGNOSIS — D649 Anemia, unspecified: Secondary | ICD-10-CM

## 2011-07-09 DIAGNOSIS — R188 Other ascites: Secondary | ICD-10-CM | POA: Diagnosis present

## 2011-07-09 DIAGNOSIS — K659 Peritonitis, unspecified: Secondary | ICD-10-CM | POA: Diagnosis present

## 2011-07-09 DIAGNOSIS — Z79899 Other long term (current) drug therapy: Secondary | ICD-10-CM

## 2011-07-09 DIAGNOSIS — E41 Nutritional marasmus: Secondary | ICD-10-CM | POA: Diagnosis present

## 2011-07-09 DIAGNOSIS — K652 Spontaneous bacterial peritonitis: Secondary | ICD-10-CM

## 2011-07-09 DIAGNOSIS — Z86718 Personal history of other venous thrombosis and embolism: Secondary | ICD-10-CM

## 2011-07-09 DIAGNOSIS — R64 Cachexia: Secondary | ICD-10-CM

## 2011-07-09 DIAGNOSIS — K859 Acute pancreatitis without necrosis or infection, unspecified: Secondary | ICD-10-CM | POA: Diagnosis present

## 2011-07-09 DIAGNOSIS — Z7901 Long term (current) use of anticoagulants: Secondary | ICD-10-CM

## 2011-07-09 DIAGNOSIS — R6251 Failure to thrive (child): Secondary | ICD-10-CM

## 2011-07-09 DIAGNOSIS — F1020 Alcohol dependence, uncomplicated: Secondary | ICD-10-CM | POA: Diagnosis present

## 2011-07-09 DIAGNOSIS — R000 Tachycardia, unspecified: Secondary | ICD-10-CM

## 2011-07-09 DIAGNOSIS — R748 Abnormal levels of other serum enzymes: Secondary | ICD-10-CM

## 2011-07-09 DIAGNOSIS — Z681 Body mass index (BMI) 19 or less, adult: Secondary | ICD-10-CM

## 2011-07-09 DIAGNOSIS — D72829 Elevated white blood cell count, unspecified: Secondary | ICD-10-CM | POA: Diagnosis present

## 2011-07-09 HISTORY — DX: Inflammatory liver disease, unspecified: K75.9

## 2011-07-09 LAB — URINALYSIS, ROUTINE W REFLEX MICROSCOPIC
Glucose, UA: NEGATIVE mg/dL
Ketones, ur: NEGATIVE mg/dL
Leukocytes, UA: NEGATIVE
Protein, ur: NEGATIVE mg/dL
Urobilinogen, UA: 0.2 mg/dL (ref 0.0–1.0)

## 2011-07-09 LAB — DIFFERENTIAL
Basophils Absolute: 0 10*3/uL (ref 0.0–0.1)
Eosinophils Absolute: 0 10*3/uL (ref 0.0–0.7)
Lymphocytes Relative: 7 % — ABNORMAL LOW (ref 12–46)
Neutrophils Relative %: 88 % — ABNORMAL HIGH (ref 43–77)

## 2011-07-09 LAB — CBC
MCHC: 34.7 g/dL (ref 30.0–36.0)
RDW: 14.1 % (ref 11.5–15.5)

## 2011-07-09 LAB — COMPREHENSIVE METABOLIC PANEL
AST: 43 U/L — ABNORMAL HIGH (ref 0–37)
BUN: 21 mg/dL (ref 6–23)
CO2: 27 mEq/L (ref 19–32)
Calcium: 8.8 mg/dL (ref 8.4–10.5)
Chloride: 86 mEq/L — ABNORMAL LOW (ref 96–112)
Creatinine, Ser: 0.68 mg/dL (ref 0.50–1.35)
GFR calc Af Amer: >90 mL/min (ref >90)
GFR calc non Af Amer: >90 mL/min (ref >90)
Glucose, Bld: 140 mg/dL — ABNORMAL HIGH (ref 70–99)
Potassium: 4.1 mEq/L (ref 3.5–5.1)
Sodium: 127 mEq/L — ABNORMAL LOW (ref 135–145)
Total Bilirubin: 0.3 mg/dL (ref 0.3–1.2)

## 2011-07-09 LAB — AMYLASE: Amylase: 947 U/L — ABNORMAL HIGH (ref 0–105)

## 2011-07-09 MED ORDER — FAMOTIDINE 20 MG PO TABS
20.0000 mg | ORAL_TABLET | Freq: Two times a day (BID) | ORAL | Status: DC
  Administered 2011-07-10 – 2011-07-13 (×6): 20 mg via ORAL
  Filled 2011-07-09 (×9): qty 1

## 2011-07-09 MED ORDER — ACETAMINOPHEN 650 MG RE SUPP: 650.0000 mg | Freq: Four times a day (QID) | RECTAL | Status: DC | PRN

## 2011-07-09 MED ORDER — ONDANSETRON HCL 4 MG/2ML IJ SOLN
4.0000 mg | Freq: Once | INTRAMUSCULAR | Status: AC
  Administered 2011-07-09: 4 mg via INTRAVENOUS
  Filled 2011-07-09: qty 2

## 2011-07-09 MED ORDER — PIPERACILLIN-TAZOBACTAM 3.375 G IVPB
3.3750 g | Freq: Once | INTRAVENOUS | Status: AC
  Administered 2011-07-09: 3.375 g via INTRAVENOUS
  Filled 2011-07-09: qty 50

## 2011-07-09 MED ORDER — SODIUM CHLORIDE 0.9 % IV BOLUS (SEPSIS)
500.0000 mL | Freq: Once | INTRAVENOUS | Status: AC
  Administered 2011-07-09: 500 mL via INTRAVENOUS

## 2011-07-09 MED ORDER — VANCOMYCIN HCL IN DEXTROSE 1-5 GM/200ML-% IV SOLN
1000.0000 mg | Freq: Once | INTRAVENOUS | Status: AC
  Administered 2011-07-09: 1000 mg via INTRAVENOUS
  Filled 2011-07-09: qty 200

## 2011-07-09 MED ORDER — ACETAMINOPHEN 325 MG PO TABS: 650.0000 mg | ORAL_TABLET | Freq: Four times a day (QID) | ORAL | Status: DC | PRN

## 2011-07-09 MED ORDER — SODIUM CHLORIDE 0.9 % IV BOLUS (SEPSIS)
500.0000 mL | Freq: Once | INTRAVENOUS | Status: AC
  Administered 2011-07-09: 1000 mL via INTRAVENOUS

## 2011-07-09 MED ORDER — HYDROMORPHONE HCL PF 1 MG/ML IJ SOLN
1.0000 mg | Freq: Once | INTRAMUSCULAR | Status: AC
  Administered 2011-07-09: 1 mg via INTRAVENOUS
  Filled 2011-07-09: qty 1

## 2011-07-09 MED ORDER — SODIUM CHLORIDE 0.9 % IV SOLN
INTRAVENOUS | Status: DC
  Administered 2011-07-09: 125 mL/h via INTRAVENOUS

## 2011-07-09 MED ORDER — ONDANSETRON HCL 4 MG/2ML IJ SOLN
4.0000 mg | Freq: Four times a day (QID) | INTRAMUSCULAR | Status: DC | PRN
  Administered 2011-07-09 – 2011-07-12 (×7): 4 mg via INTRAVENOUS
  Filled 2011-07-09 (×7): qty 2

## 2011-07-09 MED ORDER — MORPHINE SULFATE 4 MG/ML IJ SOLN
4.0000 mg | INTRAMUSCULAR | Status: DC | PRN
Start: 2011-07-09 — End: 2011-07-12
  Administered 2011-07-09 – 2011-07-12 (×21): 4 mg via INTRAVENOUS
  Filled 2011-07-09 (×21): qty 1

## 2011-07-09 MED ORDER — POTASSIUM CHLORIDE IN NACL 20-0.9 MEQ/L-% IV SOLN
INTRAVENOUS | Status: DC
  Administered 2011-07-09 – 2011-07-10 (×2): via INTRAVENOUS
  Filled 2011-07-09 (×3): qty 1000

## 2011-07-09 MED ORDER — VANCOMYCIN HCL 1000 MG IV SOLR
750.0000 mg | Freq: Two times a day (BID) | INTRAVENOUS | Status: AC
  Administered 2011-07-10 – 2011-07-12 (×6): 750 mg via INTRAVENOUS
  Filled 2011-07-09 (×6): qty 750

## 2011-07-09 MED ORDER — ONDANSETRON HCL 4 MG/2ML IJ SOLN
INTRAMUSCULAR | Status: AC
  Filled 2011-07-09: qty 2

## 2011-07-09 MED ORDER — PIPERACILLIN-TAZOBACTAM 3.375 G IVPB 30 MIN
3.3750 g | Freq: Three times a day (TID) | INTRAVENOUS | Status: AC
  Administered 2011-07-09 – 2011-07-10 (×3): 3.375 g via INTRAVENOUS
  Filled 2011-07-09 (×6): qty 50

## 2011-07-09 NOTE — ED Notes (Signed)
Pt states he started to have  abd pain x 4  Weeks ago , was seen here and admitted w/ cirrohsis , pancreatitis , had several liters of fluid drawn off abd while here. Was  D/c 4 days ago. Pt states that his abd  started to swell again and he had n/v that started 2 days ago

## 2011-07-09 NOTE — ED Provider Notes (Signed)
History     CSN: [id number]  Arrival date & time 07/09/11  1205   First MD Initiated Contact with Patient 07/09/11 1227      Chief Complaint  Patient presents with  . Abdominal Pain    (Consider location/radiation/quality/duration/timing/severity/associated sxs/prior treatment) HPI Comments: Patient with history of alcoholic hepatitis, ascites, DVT and PE on Xarelto -- presents with nausea and vomiting, worsening abdominal swelling, and shortness of breath which he attributes to the ascites. Patient has been in and out of hospital since February 26 and was discharged from [facility] on March 25. While inpatient he was diagnosed with peritonitis and treated with antibiotics and discharged home on Cipro. He also had multiple paracenteses performed. Patient states that he was recently started on lactulose by his primary care physician who is in [place]. He states that he continues to take all of his medications. Denies EtOH use.   Patient is a 41 y.o. male presenting with abdominal pain. The history is provided by the patient.  Abdominal Pain The primary symptoms of the illness include abdominal pain, shortness of breath, nausea and vomiting. The primary symptoms of the illness do not include fever, diarrhea or dysuria.  Additional symptoms associated with the illness include constipation. Symptoms associated with the illness do not include hematuria.    Past Medical History  Diagnosis Date  . Alcoholic hepatitis with ascites     liver bx march 2013, no cirrhosis.  Hep b/c negative  . Ascites     3 to 4 paracentesis from 2/28 to 06/10/11 at [facility] totalling  19 liters.  . Alcoholism /alcohol abuse 05/2011  . Pulmonary embolism 05/2011    sent home on Xarelto.  Marland Kitchen DVT (deep venous thrombosis) 05/2011    Past Surgical History  Procedure Date  . Ankle surgery   . Esophagogastroduodenoscopy 3.12.2013    Dr Braulio Conte in [place].  Irregular GE Jx, biopsied benign,  naso/SB feeding tube inserted   . Paracentesis     several during admission at [facility] 2/26- 3/18  . Liver biopsy 06/2011    transjugular    No family history on file.  History  Substance Use Topics  . Smoking status: Current Everyday Smoker  . Smokeless tobacco: Not on file  . Alcohol Use: No      Review of Systems  Constitutional: Negative for fever.  HENT: Negative for sore throat and rhinorrhea.   Eyes: Negative for redness.  Respiratory: Positive for shortness of breath. Negative for cough.   Cardiovascular: Negative for chest pain and leg swelling.  Gastrointestinal: Positive for nausea, vomiting, abdominal pain, constipation and abdominal distention. Negative for diarrhea.  Genitourinary: Negative for dysuria and hematuria.  Musculoskeletal: Negative for myalgias.  Skin: Negative for rash.  Neurological: Negative for headaches.    Allergies  Review of patient's allergies indicates no known allergies.  Home Medications   Current Outpatient Rx  Name Route Sig Dispense Refill  . FAMOTIDINE 20 MG PO TABS Oral Take 20 mg by mouth 2 (two) times daily.    . FUROSEMIDE 40 MG PO TABS Oral Take 40 mg by mouth daily.    Marland Kitchen LACTULOSE 10 GM/15ML PO SOLN Oral Take 20 g by mouth daily.    Marland Kitchen OMEPRAZOLE 20 MG PO CPDR Oral Take 20 mg by mouth 2 (two) times daily.    . OXYCODONE HCL 5 MG PO CAPS Oral Take 5 mg by mouth 3 (three) times daily.    Marland Kitchen PRENATAL PLUS 27-1 MG  PO TABS Oral Take 1 tablet by mouth daily.    Marland Kitchen PROMETHAZINE HCL 12.5 MG PO TABS Oral Take 12.5 mg by mouth every 6 (six) hours as needed. For nausea and vomiting    . RIVAROXABAN 15 MG PO TABS Oral Take 15 mg by mouth daily.    Marland Kitchen SPIRONOLACTONE 25 MG PO TABS Oral Take 25 mg by mouth 2 (two) times daily.      BP 104/80  Pulse 103  Temp(Src) 97.8 F (36.6 C) (Oral)  Resp 18  Ht 6' (1.829 m)  SpO2 100%  Physical Exam  Nursing note and vitals reviewed. Constitutional: He is oriented to person, place,  and time. He appears well-developed and well-nourished. He appears cachectic. He has a sickly appearance. No distress.  HENT:  Head: Normocephalic and atraumatic.  Right Ear: Tympanic membrane, external ear and ear canal normal.  Left Ear: Tympanic membrane, external ear and ear canal normal.  Nose: Nose normal.  Mouth/Throat: Mucous membranes are dry.       Wasting of facial soft tissues  Eyes: Conjunctivae are normal. Right eye exhibits no discharge. Left eye exhibits no discharge.  Neck: Normal range of motion. Neck supple.  Cardiovascular: Regular rhythm and normal heart sounds.  Tachycardia present.   Pulmonary/Chest: Effort normal and breath sounds normal. No respiratory distress. He has no wheezes.  Abdominal: Soft. He exhibits distension, fluid wave and ascites. There is generalized tenderness. There is rebound. There is no guarding, no tenderness at McBurney's point and negative Murphy's sign. No hernia.  Musculoskeletal: He exhibits no edema and no tenderness.  Neurological: He is alert and oriented to person, place, and time.  Skin: Skin is warm and dry.  Psychiatric: He has a normal mood and affect.    ED Course  Procedures (including critical care time)  Labs Reviewed  CBC - Abnormal; Notable for the following:    WBC 26.0 (*)    RBC 4.00 (*)    Hemoglobin 12.2 (*)    HCT 35.2 (*)    All other components within normal limits  DIFFERENTIAL - Abnormal; Notable for the following:    Neutrophils Relative 88 (*)    Lymphocytes Relative 7 (*)    Neutro Abs 22.9 (*)    Monocytes Absolute 1.3 (*)    All other components within normal limits  COMPREHENSIVE METABOLIC PANEL - Abnormal; Notable for the following:    Sodium 127 (*)    Chloride 86 (*)    Glucose, Bld 140 (*)    Albumin 2.2 (*)    AST 43 (*)    Alkaline Phosphatase 194 (*)    All other components within normal limits  LIPASE, BLOOD - Abnormal; Notable for the following:    Lipase 467 (*)    All other  components within normal limits  PROTIME-INR - Abnormal; Notable for the following:    Prothrombin Time 23.3 (*)    INR 2.03 (*)    All other components within normal limits  AMMONIA  URINALYSIS, ROUTINE W REFLEX MICROSCOPIC   Dg Chest 2 View  07/09/2011  *RADIOLOGY REPORT*  Clinical Data: Shortness of breath, ascites  CHEST - 2 VIEW  Comparison: 06/21/2011  Findings: Cardiomediastinal silhouette is stable.  There is atelectasis in the right middle lobe.  Persistent residual nodular infiltrate in the right lower lobe laterally measures 1.2 cm.  No new infiltrate is noted.  Follow-up examination to assure complete resolution is recommended.  IMPRESSION: There is atelectasis in the right middle  lobe.  Persistent residual nodular infiltrate in the right lower lobe laterally measures 1.2 cm.  No new infiltrate is noted.  Follow-up examination to assure complete resolution is recommended.  Original Report Authenticated By: Natasha Mead, M.D.    1. Peritonitis   2. Ascites   3. Elevated lipase     12:45 PM Patient seen and examined. Work-up initiated.   Vital signs reviewed and are as follows: Filed Vitals:   07/09/11 1213  BP: 104/80  Pulse: 103  Temp: 97.8 F (36.6 C)  Resp: 18   12:45 PM Patient was discussed with Doug Sou, MD  1:29 PM WBC 26k. Pt will need readmission. Zosyn ordered for suspected peritonitis. When asked, patient states he finished the cipro. Per d/c instructions patient is supposed to be taking 500mg  cipro qd x 3 months. Also Xarelto bottle is empty.   2:13 PM [facility] will admit. Vancomycin added to regimen.   MDM  Admit for recurrent peritonitis        Renne Crigler, [place] 07/09/11 1414

## 2011-07-09 NOTE — ED Notes (Signed)
Waiting for admission orders from the Hospitalist

## 2011-07-09 NOTE — ED Notes (Signed)
Admitting physician at the bedside.

## 2011-07-09 NOTE — Progress Notes (Signed)
  ANTIBIOTIC CONSULT NOTE - INITIAL  Pharmacy Consult for Vancomycin Indication: peritonitis  No Known Allergies  Patient Measurements: Height: 6' (182.9 cm) Weight: 118 lb 2.7 oz (53.6 kg) IBW/kg (Calculated) : 77.6   Vital Signs: Temp: 98.7 F (37.1 C) (04/02 1600) Temp src: Oral (04/02 1600) BP: 102/82 mmHg (04/02 1600) Pulse Rate: 108  (04/02 1600) Intake/Output from previous day:   Intake/Output from this shift:    Labs:  Basename 07/09/11 1238  WBC 26.0*  HGB 12.2*  PLT 357  LABCREA --  CREATININE 0.68   Estimated Creatinine Clearance: 94.9 ml/min (by C-G formula based on Cr of 0.68).    Microbiology: Recent Results (from the past 720 hour(s))  BODY FLUID CULTURE     Status: Normal   Collection Time   06/27/11  9:13 AM      Component Value Range Status Comment   Specimen Description ABDOMEN ASCITIC FLUID   Final    Special Requests   Final    Gram Stain     Final    Value: MODERATE WBC PRESENT, PREDOMINANTLY PMN     NO ORGANISMS SEEN   Culture NO GROWTH 3 DAYS   Final    Report Status 06/30/2011 FINAL   Final     Medical History: Past Medical History  Diagnosis Date  . Alcoholic hepatitis with ascites     liver bx march 2013, no cirrhosis.  Hep b/c negative  . Ascites     3 to 4 paracentesis from 2/28 to 06/10/11 at [facility] totalling  19 liters.  . Alcoholism /alcohol abuse 05/2011  . Pulmonary embolism 05/2011    sent home on Xarelto.  Marland Kitchen DVT (deep venous thrombosis) 05/2011    Medications:  Received Vancomycin 1 gm IV x 1 dose in ER ~ 1430  Assessment: 41 yo M with history of alcoholic hepatitis and ascites recently discharged from [facility] on oral antibiotics for peritonitis.  Pt returns to ER today with N/V, worsening abdominal swelling, SOB, and elevated WBC.  To start on Vancomycin and Zosyn for peritonitis.  Goal of Therapy:  Vancomycin trough level 10-15 mcg/ml  Plan:  Vancomycin 750 mg IV q12h - next dose due tomorrow at  0200 Will follow renal function and culture data Will check a Vancomycin level when indicated.   [employer], Pharm.D., BCPS Clinical Pharmacist Pager [phone] 07/09/2011 4:20 PM

## 2011-07-09 NOTE — ED Notes (Signed)
3011-01 Ready 

## 2011-07-09 NOTE — ED Provider Notes (Signed)
Medical screening examination/treatment/procedure(s) were conducted as a shared visit with non-physician practitioner(s) and myself.  I personally evaluated the patient during the encounter  Doug Sou, MD 07/09/11 [phone]

## 2011-07-09 NOTE — ED Notes (Signed)
Pt medicated for pain as ordered. Will monitor 

## 2011-07-09 NOTE — H&P (Signed)
PCP:   MITCHELL,RAJAN, DO, DO   Chief Complaint:  Swelling and pain of the belly for 2 days.  HPI: Antonio [name] presents with pain and increasing abdominal girth. He was discharged from the hospital on 07/01/11, after treatment for peritonitis, presumed infectious, although culture negative. He does not seem to have cirrhosis from record review, although he has history of alcoholism. Gi saw him in the last admission and recommended antibiotics,(supposed to take for secondary prophylaxis for 3 months but only took 3 day supply given at d/c and did not have prescription filled for the rest). He comes back with diffuse abdominal pain, more marked on the left side, associated with recurrence of abdominal swelling. He has also been constipated and vomited. He was also on xarelto for?PE. In ED he was found to have a wbc of 16109. He denies cough or sob. He has been given vanc/zosyn and referred for admission.  Review of Systems:  As per hpi.  Past Medical History: Past Medical History  Diagnosis Date  . Alcoholic hepatitis with ascites     liver bx march 2013, no cirrhosis.  Hep b/c negative  . Ascites     3 to 4 paracentesis from 2/28 to 06/10/11 at [facility] totalling  19 liters.  . Alcoholism /alcohol abuse 05/2011  . Pulmonary embolism 05/2011    sent home on Xarelto.  Marland Kitchen DVT (deep venous thrombosis) 05/2011   Past Surgical History  Procedure Date  . Ankle surgery   . Esophagogastroduodenoscopy 3.12.2013    Dr Braulio Conte in [place].  Irregular GE Jx, biopsied benign, naso/SB feeding tube inserted   . Paracentesis     several during admission at [facility] 2/26- 3/18  . Liver biopsy 06/2011    transjugular    Medications: Prior to Admission medications   Medication Sig Start Date End Date Taking? Authorizing Provider  famotidine (PEPCID) 20 MG tablet Take 20 mg by mouth 2 (two) times daily.   Yes Historical Provider, MD  furosemide (LASIX) 40 MG tablet Take 40 mg by mouth  daily.   Yes Historical Provider, MD  lactulose (CHRONULAC) 10 GM/15ML solution Take 20 g by mouth daily.   Yes Historical Provider, MD  omeprazole (PRILOSEC) 20 MG capsule Take 20 mg by mouth 2 (two) times daily.   Yes Historical Provider, MD  oxycodone (OXY-IR) 5 MG capsule Take 5 mg by mouth 3 (three) times daily.   Yes Historical Provider, MD  prenatal vitamin w/FE, FA (PRENATAL 1 + 1) 27-1 MG TABS Take 1 tablet by mouth daily.   Yes Historical Provider, MD  promethazine (PHENERGAN) 12.5 MG tablet Take 12.5 mg by mouth every 6 (six) hours as needed. For nausea and vomiting   Yes Historical Provider, MD  Rivaroxaban (XARELTO) 15 MG TABS tablet Take 15 mg by mouth daily.   Yes Historical Provider, MD  spironolactone (ALDACTONE) 25 MG tablet Take 25 mg by mouth 2 (two) times daily.   Yes Historical Provider, MD    Allergies:  No Known Allergies  Social History:  reports that he has been smoking.  He does not have any smokeless tobacco history on file. He reports that he does not drink alcohol or use illicit drugs.   Family History: Mother had alcoholic liver disease.  Physical Exam: Filed Vitals:   07/09/11 1329 07/09/11 1410 07/09/11 1445 07/09/11 1515  BP: 108/86 108/81 104/85 102/82  Pulse: 111 108 110 109  Temp:      TempSrc:  Resp: 18 16    Height:      SpO2: 100% 100% 100% 99%   Cachetic, not is distress. No jvd. Poor dental hygiene. Lungs clear bilaterally. S1S2. No murmurs. RRR. Abdomen- uniformly distended. Quite tender to palpation diffusely, hence limited exam. +BS. CNS- grossly intact. Extremities- no pedal edema.   Labs on Admission:   [facility] 07/09/11 1238  NA 127*  K 4.1  CL 86*  CO2 27  GLUCOSE 140*  BUN 21  CREATININE 0.68  CALCIUM 8.8  MG --  PHOS --    Basename 07/09/11 1238  AST 43*  ALT 21  ALKPHOS 194*  BILITOT 0.3  PROT 6.0  ALBUMIN 2.2*    Basename 07/09/11 1238  LIPASE 467*  AMYLASE --    Basename 07/09/11 1238  WBC 26.0*    NEUTROABS 22.9*  HGB 12.2*  HCT 35.2*  MCV 88.0  PLT 357   No results found for this basename: CKTOTAL:3,CKMB:3,CKMBINDEX:3,TROPONINI:3 in the last 72 hours No results found for this basename: TSH,T4TOTAL,FREET3,T3FREE,THYROIDAB in the last 72 hours No results found for this basename: VITAMINB12:2,FOLATE:2,FERRITIN:2,TIBC:2,IRON:2,RETICCTPCT:2 in the last 72 hours  Radiological Exams on Admission: Dg Chest 2 View  07/09/2011  *RADIOLOGY REPORT*  Clinical Data: Shortness of breath, ascites  CHEST - 2 VIEW  Comparison: 06/21/2011  Findings: Cardiomediastinal silhouette is stable.  There is atelectasis in the right middle lobe.  Persistent residual nodular infiltrate in the right lower lobe laterally measures 1.2 cm.  No new infiltrate is noted.  Follow-up examination to assure complete resolution is recommended.  IMPRESSION: There is atelectasis in the right middle lobe.  Persistent residual nodular infiltrate in the right lower lobe laterally measures 1.2 cm.  No new infiltrate is noted.  Follow-up examination to assure complete resolution is recommended.  Original Report Authenticated By: Natasha Mead, M.D.   Ct Abdomen Pelvis W Contrast  06/27/2011  *RADIOLOGY REPORT*  Clinical Data: Diffuse abdominal pain, ascites which appeared infected at paracentesis, question source; past history DVT, pulmonary embolism, alcoholic hepatitis  CT ABDOMEN AND PELVIS WITH CONTRAST  Technique:  Multidetector CT imaging of the abdomen and pelvis was performed following the standard protocol during bolus administration of intravenous contrast. Sagittal and coronal MPR images reconstructed from axial data set.  Contrast:  Dilute oral contrast.  80 ml Omnipaque-300 IV  Comparison: 06/07/2011  Findings: Bibasilar small pleural effusions with minimal compressive atelectasis of lower lobes. More focal areas of parenchymal opacity/volume loss are seen at the bases of the right middle and right lower lobes, question atelectasis  versus pulmonary infarcts in patient with known large pulmonary emboli at these sites. Liver, spleen, pancreas, kidneys, and adrenal glands normal appearance. Contracted gallbladder.  Significant ascites with newly identified significant peritoneal enhancement highly suggestive of peritonitis. Diffuse dilatation of small bowel loops without evidence of obstruction. Normal appendix. Normal-appearing bladder. No definite mass, adenopathy or hernia. Colon and stomach unremarkable. No acute osseous findings. Bones appear questionably demineralized.  IMPRESSION: Significant ascites with newly identified peritoneal enhancement diffusely highly suggestive of peritonitis. No focal identifiable cause for peritonitis is identified. Small bibasilar pleural effusions with atelectasis versus infarcts at right lung base.  Original Report Authenticated By: Lollie Marrow, M.D.   US Paracentesis  06/28/2011  *RADIOLOGY REPORT*  Clinical Data: Abdominal ascites; cirrhosis  ULTRASOUND GUIDED PARACENTESIS  Comparison:  Previous paracentesis  An ultrasound guided paracentesis was thoroughly discussed with the patient and questions answered.  The benefits, risks, alternatives and complications were also discussed.  The patient  understands and wishes to proceed with the procedure.  Written consent was obtained.  Ultrasound was performed to localize and mark an adequate pocket of fluid in the right lower quadrant of the abdomen.  The area was then prepped and draped in the normal sterile fashion.  1% Lidocaine was used for local anesthesia.  Under ultrasound guidance a 19 gauge Yueh catheter was introduced.  Paracentesis was performed.  The catheter was removed and a dressing applied.  Complications:  None  Findings:  A total of approximately 1.9 liters of dark brown fluid was removed.  A fluid sample was sent for laboratory analysis.  IMPRESSION: Successful ultrasound guided paracentesis yielding 1.9 liters of ascites.  Read by: Ralene Muskrat, P.A.-C  Original Report Authenticated By: Judie Petit. Ruel Favors, M.D.   US Paracentesis  06/25/2011  *RADIOLOGY REPORT*  Clinical Data: Abdominal ascites; cirrhosis  ULTRASOUND GUIDED PARACENTESIS  Comparison:  Previous paracentesis  An ultrasound guided paracentesis was thoroughly discussed with the patient and questions answered.  The benefits, risks, alternatives and complications were also discussed.  The patient understands and wishes to proceed with the procedure.  Written consent was obtained.  Ultrasound was performed to localize and mark an adequate pocket of fluid in the left lower quadrant of the abdomen.  The area was then prepped and draped in the normal sterile fashion.  1% Lidocaine was used for local anesthesia.  Under ultrasound guidance a 19 gauge Yueh catheter was introduced.  Paracentesis was performed.  The catheter was removed and a dressing applied.  Complications:  None  Findings:  A total of approximately 6 liters of dark brown fluid was removed.  A fluid sample was not sent for laboratory analysis.  IMPRESSION: Successful ultrasound guided paracentesis yielding 6 liters of ascites.  Read by: Ralene Muskrat, P.A.-C  Original Report Authenticated By: Richarda Overlie, M.D.   Dg Chest Portable 1 View  06/21/2011  *RADIOLOGY REPORT*  Clinical Data: Fatigue, tachycardia, cough  PORTABLE CHEST - 1 VIEW  Comparison: 06/03/2011  Findings: Cardiomediastinal silhouette is stable.  Study is limited by poor inspiration.  Mild right basilar atelectasis. Improvement in aeration in the right lower lobe with decreasing size of the right lateral focal infiltrate measures about 1.2 cm.  Follow-up examination to assure complete resolution is recommended.  No new infiltrate or pulmonary edema.  IMPRESSION:  Study is limited by poor inspiration.  Mild right basilar atelectasis. Improvement in aeration in the right lower lobe with decreasing size of the right lateral focal infiltrate measures about 1.2 cm.   Follow-up examination to assure complete resolution is recommended.  No new infiltrate or pulmonary edema.  Original Report Authenticated By: Natasha Mead, M.D.    Assessment 41 year old male with history of alcoholism, here with apparent recurrent acute peritonitis, whose etiology is not clear. He has evidence of acute pancreatitis, which could be the source of recurrent inflammation. Patient anticoagulated for ?PE. He has hyponatremia, likely from fluid overload and pain. Plan .Pancreatitis, acute- ?alcoholic related versus other causes. Urine drug screen/lipids panel/follow amylase/lipase, consider mrcp once patient can tolerate procedure better, cea/ca 19-9. Keep NPO. IVF/pain meds .Peritonitis- presumed infectious, apparently non-cirrhotic. Repeat ultrasound guided paracentesis with repeat cell count and culture. Meanwhile cover with vanc/zosyn. Consider gi depending on tap. kub to r/o obstruction. .Alcoholism- folate/thiamine. .Leukocytosis- likely due to peritonitis. Monitor with abx on body. PE history on xarelto- will hold for tap. Resume once tap done? Gi prophylaxis.  Condition guarded. Discussed plan of care  with patient's sister at bed side.  Antonio [name] [phone]. 07/09/2011, 3:52 PM

## 2011-07-09 NOTE — ED Provider Notes (Signed)
Complains of worsening abdominal pain, diffuse over the past 2 days. Patient reports he was unable to hold down water last night without vomiting. On exam chronically ill-appearing cachectic abdomen is distended diffusely tender to palpation  Doug Sou, MD 07/09/11 1631

## 2011-07-09 NOTE — ED Notes (Signed)
Pt undressed, in gown, on continuous pulse oximetry and blood pressure cuff; blanket given 

## 2011-07-10 ENCOUNTER — Encounter (HOSPITAL_COMMUNITY): Payer: Self-pay | Admitting: General Practice

## 2011-07-10 ENCOUNTER — Inpatient Hospital Stay (HOSPITAL_COMMUNITY): Payer: Medicaid Other

## 2011-07-10 LAB — URINE CULTURE
Colony Count: NO GROWTH
Culture: NO GROWTH

## 2011-07-10 LAB — LIPID PANEL
Cholesterol: 106 mg/dL (ref 0–200)
HDL: 10 mg/dL — ABNORMAL LOW (ref >39)
Total CHOL/HDL Ratio: 10.6 RATIO
Triglycerides: 148 mg/dL (ref <150)
VLDL: 30 mg/dL (ref 0–40)

## 2011-07-10 LAB — BODY FLUID CELL COUNT WITH DIFFERENTIAL
Eos, Fluid: 0 %
Lymphs, Fluid: 0 %
Neutrophil Count, Fluid: 95 % — ABNORMAL HIGH (ref 0–25)
Total Nucleated Cell Count, Fluid: 2220 cu mm — ABNORMAL HIGH (ref 0–1000)

## 2011-07-10 LAB — DRUGS OF ABUSE SCREEN W/O ALC, ROUTINE URINE
Cocaine Metabolites: NEGATIVE
Creatinine,U: 117.1 mg/dL
Opiate Screen, Urine: POSITIVE — AB
Propoxyphene: NEGATIVE

## 2011-07-10 LAB — AMYLASE: Amylase: 607 U/L — ABNORMAL HIGH (ref 0–105)

## 2011-07-10 LAB — CEA: CEA: <0.5 ng/mL (ref 0.0–5.0)

## 2011-07-10 LAB — CANCER ANTIGEN 19-9: CA 19-9: 5.3 U/mL — ABNORMAL LOW (ref <35.0)

## 2011-07-10 MED ORDER — FUROSEMIDE 40 MG PO TABS
40.0000 mg | ORAL_TABLET | Freq: Every day | ORAL | Status: DC
  Administered 2011-07-10 – 2011-07-13 (×4): 40 mg via ORAL
  Filled 2011-07-10 (×4): qty 1

## 2011-07-10 MED ORDER — SPIRONOLACTONE 100 MG PO TABS
100.0000 mg | ORAL_TABLET | Freq: Every day | ORAL | Status: DC
  Administered 2011-07-10 – 2011-07-13 (×4): 100 mg via ORAL
  Filled 2011-07-10 (×4): qty 1

## 2011-07-10 MED ORDER — PROMETHAZINE HCL 25 MG PO TABS
25.0000 mg | ORAL_TABLET | Freq: Four times a day (QID) | ORAL | Status: DC | PRN
  Administered 2011-07-10 – 2011-07-13 (×3): 25 mg via ORAL
  Filled 2011-07-10 (×4): qty 1

## 2011-07-10 MED ORDER — PIPERACILLIN-TAZOBACTAM 3.375 G IVPB
3.3750 g | Freq: Three times a day (TID) | INTRAVENOUS | Status: AC
  Administered 2011-07-10 – 2011-07-12 (×7): 3.375 g via INTRAVENOUS
  Filled 2011-07-10 (×7): qty 50

## 2011-07-10 NOTE — Progress Notes (Signed)
Clinical Social Work Department BRIEF PSYCHOSOCIAL ASSESSMENT 07/10/2011  Patient:  Antonio [name], Antonio [name]     Account Number:  [id number]     Admit date:  07/09/2011  Clinical Social Worker:  Lourdes Sledge  Date/Time:  07/10/2011 12:23 PM  Referred by:  Physician  Date Referred:  07/10/2011 Referred for  Homelessness   Other Referral:   Interview type:  Patient Other interview type:    PSYCHOSOCIAL DATA Living Status:  SIBLING Admitted from facility:   Level of care:   Primary support name:  Scotland Korver Primary support relationship to patient:  SIBLING Degree of support available:   Per pt Bryan allows pt to live with him and is a support to pt.    CURRENT CONCERNS Current Concerns  Post-Acute Placement   Other Concerns:   Alcohol abuse    SOCIAL WORK ASSESSMENT / PLAN CSW received a call from pt RN stating that pt is homeless. CSW visited pt who is alert, oriented and pleasent to speak to. Pt states he lives with his brother Braelyn Bordonaro and is able to return home with him when medically stable. CSW received consent to speak with [name] to confirm pt can return today. CSW explored pt history of substance abuse. Pt reports he stopped drinking 2 months ago and denies using any drugs. Pt stated he stoppe drinking cold [place] when he "lost the taste of alcohol." Pt states he does not believe he has any issues with drugs and alcohol. Pt appreciative of CSW visit however is not in need of CSW assistance.   Assessment/plan status:   Other assessment/ plan:   Information/referral to community resources:    PATIENT'S/FAMILY'S RESPONSE TO PLAN OF CARE: Pt laying in bed, alert, oriented and pleasant to speak to. Pt states his pain medicine has given him a stomach ache. Pt reports having a safe place to return to when he discharges from the hospital. Pt denies current substance abuse and homelessness. No further CSW needs addressed.

## 2011-07-10 NOTE — Progress Notes (Signed)
07/10/2011 Jawanna Dykman SPARKS Case Management Note [phone]  Utilization review completed.  

## 2011-07-10 NOTE — Progress Notes (Signed)
Pt has an address [place] in [place] and has recurrent admissions over the past few months. Call received from [facility] to report that the patient was going to be evaluated by them after receiving a referral from [facility]. Social work was notified of patients situation to provide an evaluation for eventual discharge care.

## 2011-07-10 NOTE — Progress Notes (Signed)
Subjective: C/o nausea   Objective: Vital signs in last 24 hours: Filed Vitals:   07/09/11 1515 07/09/11 1600 07/09/11 2208 07/10/11 0608  BP: 102/82 102/82 102/75 110/75  Pulse: 109 108 108 96  Temp:  98.7 F (37.1 C) 97.9 F (36.6 C) 97.8 F (36.6 C)  TempSrc:  Oral Oral Oral  Resp:   18 18  Height:  6' (1.829 m)    Weight:  53.6 kg (118 lb 2.7 oz)    SpO2: 99% 100% 98% 99%   Weight change:   Intake/Output Summary (Last 24 hours) at 07/10/11 0959 Last data filed at 07/10/11 0800  Gross per 24 hour  Intake     50 ml  Output    200 ml  Net   -150 ml    Physical Exam: General: Awake, Oriented, cahexic HEENT: EOMI. Neck: Supple CV: S1 and S2,rrr Lungs: Clear to ascultation bilaterally, no wheezing Abdomen: Soft, Nontender, Nondistended, +bowel sounds. Ext: Good pulses. Trace edema.   Lab Results:  [facility] 07/10/11 0615 07/09/11 1238  NA -- 127*  K -- 4.1  CL -- 86*  CO2 -- 27  GLUCOSE -- 140*  BUN -- 21  CREATININE -- 0.68  CALCIUM -- 8.8  MG 1.8 --  PHOS 3.1 --    Basename 07/09/11 1238  AST 43*  ALT 21  ALKPHOS 194*  BILITOT 0.3  PROT 6.0  ALBUMIN 2.2*    Basename 07/10/11 0615 07/09/11 1526 07/09/11 1238  LIPASE 165* -- 467*  AMYLASE 607* 947* --    Basename 07/09/11 1238  WBC 26.0*  NEUTROABS 22.9*  HGB 12.2*  HCT 35.2*  MCV 88.0  PLT 357   No results found for this basename: CKTOTAL:3,CKMB:3,CKMBINDEX:3,TROPONINI:3 in the last 72 hours No components found with this basename: POCBNP:3 No results found for this basename: DDIMER:2 in the last 72 hours No results found for this basename: HGBA1C:2 in the last 72 hours  Basename 07/10/11 0615  CHOL 106  HDL 10*  LDLCALC 66  TRIG 409  CHOLHDL 10.6  LDLDIRECT --   No results found for this basename: TSH,T4TOTAL,FREET3,T3FREE,THYROIDAB in the last 72 hours No results found for this basename: VITAMINB12:2,FOLATE:2,FERRITIN:2,TIBC:2,IRON:2,RETICCTPCT:2 in the last 72 hours  Micro  Results: No results found for this or any previous visit (from the past 240 hour(s)).  Studies/Results: Dg Chest 2 View  07/09/2011  *RADIOLOGY REPORT*  Clinical Data: Shortness of breath, ascites  CHEST - 2 VIEW  Comparison: 06/21/2011  Findings: Cardiomediastinal silhouette is stable.  There is atelectasis in the right middle lobe.  Persistent residual nodular infiltrate in the right lower lobe laterally measures 1.2 cm.  No new infiltrate is noted.  Follow-up examination to assure complete resolution is recommended.  IMPRESSION: There is atelectasis in the right middle lobe.  Persistent residual nodular infiltrate in the right lower lobe laterally measures 1.2 cm.  No new infiltrate is noted.  Follow-up examination to assure complete resolution is recommended.  Original Report Authenticated By: Natasha Mead, M.D.   Dg Abd 1 View  07/09/2011  *RADIOLOGY REPORT*  Clinical Data: Abdominal pain, vomiting  ABDOMEN - 1 VIEW  Comparison: CT abdomen pelvis of 06/27/2011  Findings: Both large and small bowel gas is present without distention.  There is haziness throughout the abdomen suggestive of a moderate to large amount of ascites.  No opaque calculi are seen. No bony abnormality is noted.  IMPRESSION: No bowel obstruction.  Probable ascites.  Original Report Authenticated By: Juline Patch, M.D.  Medications: I have reviewed the patient's current medications. Scheduled Meds:   . famotidine  20 mg Oral BID  .  HYDROmorphone (DILAUDID) injection  1 mg Intravenous Once  . ondansetron      . ondansetron  4 mg Intravenous Once  . piperacillin-tazobactam (ZOSYN)  IV  3.375 g Intravenous Once  . piperacillin-tazobactam  3.375 g Intravenous Q8H  . sodium chloride  500 mL Intravenous Once  . sodium chloride  500 mL Intravenous Once  . vancomycin  750 mg Intravenous Q12H  . vancomycin  1,000 mg Intravenous Once   Continuous Infusions:   . 0.9 % NaCl with KCl 20 mEq / L 50 mL/hr at 07/09/11 1600  .  DISCONTD: sodium chloride 125 mL/hr (07/09/11 1429)   PRN Meds:.acetaminophen, acetaminophen, morphine, ondansetron, promethazine  Assessment/Plan: 41 year old male with history of alcoholism, here with apparent recurrent acute peritonitis, whose etiology is not clear- he did not take his antibiotics as previously prescribed. He has evidence of acute pancreatitis, which could be the source of recurrent inflammation. Patient anticoagulated for ?PE.   Plan .Pancreatitis, acute- ?alcoholic related versus other causes. Urine drug screen/lipids panel/follow amylase/lipase, consider mrcp once patient can tolerate procedure better, cea/ca 19-9. Keep NPO. IVF/pain meds  .Peritonitis- presumed infectious, apparently non-cirrhotic. Repeat ultrasound guided paracentesis with repeat cell count and culture. Meanwhile cover with vanc/zosyn. Consider gi depending on tap. kub to shows no obstruction only ascities.  .Alcoholism- folate/thiamine.  .Leukocytosis- likely due to peritonitis. Monitor with abx on body.   PE history on xarelto- will hold for tap. Resume once tap done?   Hyponatremia- monitor, d/c abx when eating later today      LOS: 1 day  Lexia Vandevender, DO 07/10/2011, 9:59 AM

## 2011-07-10 NOTE — Procedures (Signed)
US guided LLQ paracentesis  3.3 Liters brownish fluid Sent for labs per MD  BP stable

## 2011-07-11 LAB — CBC
HCT: 30 % — ABNORMAL LOW (ref 39.0–52.0)
Hemoglobin: 9.8 g/dL — ABNORMAL LOW (ref 13.0–17.0)
MCV: 89.6 fL (ref 78.0–100.0)
RBC: 3.35 MIL/uL — ABNORMAL LOW (ref 4.22–5.81)
WBC: 17.2 10*3/uL — ABNORMAL HIGH (ref 4.0–10.5)

## 2011-07-11 LAB — BASIC METABOLIC PANEL
BUN: 11 mg/dL (ref 6–23)
CO2: 28 mEq/L (ref 19–32)
Chloride: 92 mEq/L — ABNORMAL LOW (ref 96–112)
Creatinine, Ser: 0.61 mg/dL (ref 0.50–1.35)

## 2011-07-11 LAB — PATHOLOGIST SMEAR REVIEW

## 2011-07-11 MED ORDER — RIVAROXABAN 10 MG PO TABS
20.0000 mg | ORAL_TABLET | Freq: Every day | ORAL | Status: DC
  Administered 2011-07-11 – 2011-07-13 (×3): 20 mg via ORAL
  Filled 2011-07-11 (×3): qty 2

## 2011-07-11 MED ORDER — BOOST / RESOURCE BREEZE PO LIQD
1.0000 | Freq: Three times a day (TID) | ORAL | Status: DC
  Administered 2011-07-11: 1 via ORAL
  Administered 2011-07-11: 237 mL via ORAL
  Administered 2011-07-12 – 2011-07-13 (×3): 1 via ORAL

## 2011-07-11 MED ORDER — RIVAROXABAN 15 MG PO TABS: 15.0000 mg | ORAL_TABLET | Freq: Every day | ORAL | Status: DC

## 2011-07-11 MED ORDER — RIVAROXABAN 15 MG PO TABS
15.0000 mg | ORAL_TABLET | Freq: Every day | ORAL | Status: DC
  Filled 2011-07-11: qty 1

## 2011-07-11 NOTE — Progress Notes (Addendum)
Subjective:   Having abdominal pain, able to eat some but not all of his meals  Objective: Vital signs in last 24 hours: Filed Vitals:   07/10/11 1735 07/10/11 2217 07/11/11 0617 07/11/11 0815  BP: 101/77 102/74 97/74 100/76  Pulse: 108 109 108 104  Temp: 97.7 F (36.5 C) 98.1 F (36.7 C) 97.6 F (36.4 C) 97.7 F (36.5 C)  TempSrc: Oral Oral Oral Oral  Resp: 18 18 20 20   Height:      Weight:      SpO2: 100% 99% 96% 98%   Weight change:   Intake/Output Summary (Last 24 hours) at 07/11/11 0825 Last data filed at 07/11/11 0500  Gross per 24 hour  Intake    780 ml  Output   1275 ml  Net   -495 ml    Physical Exam: General: Awake, Oriented, cahexic HEENT: EOMI. Neck: Supple CV: S1 and S2,rrr Lungs: Clear to ascultation bilaterally, no wheezing Abdomen: Soft, Nontender, Nondistended, +bowel sounds. Ext: Good pulses. Trace edema.   Lab Results:  Basename 07/11/11 0545 07/10/11 0615 07/09/11 1238  NA 129* -- 127*  K 3.6 -- 4.1  CL 92* -- 86*  CO2 28 -- 27  GLUCOSE 123* -- 140*  BUN 11 -- 21  CREATININE 0.61 -- 0.68  CALCIUM 8.0* -- 8.8  MG -- 1.8 --  PHOS -- 3.1 --    Basename 07/09/11 1238  AST 43*  ALT 21  ALKPHOS 194*  BILITOT 0.3  PROT 6.0  ALBUMIN 2.2*    Basename 07/10/11 0615 07/09/11 1526 07/09/11 1238  LIPASE 165* -- 467*  AMYLASE 607* 947* --    Basename 07/11/11 0545 07/09/11 1238  WBC 17.2* 26.0*  NEUTROABS -- 22.9*  HGB 9.8* 12.2*  HCT 30.0* 35.2*  MCV 89.6 88.0  PLT 301 357   No results found for this basename: CKTOTAL:3,CKMB:3,CKMBINDEX:3,TROPONINI:3 in the last 72 hours No components found with this basename: POCBNP:3 No results found for this basename: DDIMER:2 in the last 72 hours No results found for this basename: HGBA1C:2 in the last 72 hours  Basename 07/10/11 0615  CHOL 106  HDL 10*  LDLCALC 66  TRIG 308  CHOLHDL 10.6  LDLDIRECT --   No results found for this basename: TSH,T4TOTAL,FREET3,T3FREE,THYROIDAB in the  last 72 hours No results found for this basename: VITAMINB12:2,FOLATE:2,FERRITIN:2,TIBC:2,IRON:2,RETICCTPCT:2 in the last 72 hours  Micro Results: Recent Results (from the past 240 hour(s))  URINE CULTURE     Status: Normal   Collection Time   07/09/11  7:24 PM      Component Value Range Status Comment   Specimen Description URINE, CLEAN CATCH   Final    Special Requests NONE   Final    Culture  Setup Time [id number]   Final    Colony Count NO GROWTH   Final    Culture NO GROWTH   Final    Report Status 07/10/2011 FINAL   Final   BODY FLUID CULTURE     Status: Normal (Preliminary result)   Collection Time   07/10/11 10:46 AM      Component Value Range Status Comment   Specimen Description FLUID ASCITIC   Final    Special Requests NONE   Final    Gram Stain     Final    Value: RARE WBC PRESENT, PREDOMINANTLY PMN     NO ORGANISMS SEEN   Culture PENDING   Incomplete    Report Status PENDING   Incomplete  Studies/Results: Dg Chest 2 View  07/09/2011  *RADIOLOGY REPORT*  Clinical Data: Shortness of breath, ascites  CHEST - 2 VIEW  Comparison: 06/21/2011  Findings: Cardiomediastinal silhouette is stable.  There is atelectasis in the right middle lobe.  Persistent residual nodular infiltrate in the right lower lobe laterally measures 1.2 cm.  No new infiltrate is noted.  Follow-up examination to assure complete resolution is recommended.  IMPRESSION: There is atelectasis in the right middle lobe.  Persistent residual nodular infiltrate in the right lower lobe laterally measures 1.2 cm.  No new infiltrate is noted.  Follow-up examination to assure complete resolution is recommended.  Original Report Authenticated By: Natasha Mead, M.D.   Dg Abd 1 View  07/09/2011  *RADIOLOGY REPORT*  Clinical Data: Abdominal pain, vomiting  ABDOMEN - 1 VIEW  Comparison: CT abdomen pelvis of 06/27/2011  Findings: Both large and small bowel gas is present without distention.  There is haziness throughout the  abdomen suggestive of a moderate to large amount of ascites.  No opaque calculi are seen. No bony abnormality is noted.  IMPRESSION: No bowel obstruction.  Probable ascites.  Original Report Authenticated By: Juline Patch, M.D.    Medications: I have reviewed the patient's current medications. Scheduled Meds:    . famotidine  20 mg Oral BID  . furosemide  40 mg Oral Daily  . piperacillin-tazobactam  3.375 g Intravenous Q8H  . piperacillin-tazobactam (ZOSYN)  IV  3.375 g Intravenous Q8H  . spironolactone  100 mg Oral Daily  . vancomycin  750 mg Intravenous Q12H   Continuous Infusions:    . 0.9 % NaCl with KCl 20 mEq / L 50 mL/hr at 07/10/11 2203   PRN Meds:.acetaminophen, acetaminophen, morphine, ondansetron, promethazine  Assessment/Plan: 41 year old male with history of alcoholism, here with apparent recurrent acute peritonitis, whose etiology is not clear- he did not take his antibiotics as previously prescribed. He has evidence of acute pancreatitis, which could be the source of recurrent inflammation. Patient anticoagulated for ?PE.   Plan .Pancreatitis, acute- ?alcoholic related versus other causes. Urine drug screen/lipids panel/follow amylase/lipase, consider mrcp once patient can tolerate procedure better, cea/ca 19-9. Keep NPO. IVF/pain meds  .Peritonitis- presumed infectious, apparently non-cirrhotic. Repeat ultrasound guided paracentesis with repeat cell count and culture. Meanwhile cover with vanc/zosyn. kub to shows no obstruction only ascities. Plan to continue IV abx til WBCs normalize then plan to switch to PO abx- patient was not compliant with his previous abx on d/c  .Alcoholism- folate/thiamine.  .Leukocytosis- likely due to peritonitis. Monitor with abx  PE history on xarelto- resume now that tap is done   Hyponatremia- monitor, d/c IVF when eating later today  Severe malnutirion    LOS: 2 days  Rozell Theiler, DO 07/11/2011, 8:25 AM

## 2011-07-11 NOTE — Progress Notes (Signed)
INITIAL ADULT NUTRITION ASSESSMENT Date: 07/11/2011   Time: 10:55 AM Reason for Assessment: Nutrition Risk  ASSESSMENT: Male 41 y.o.  Dx: Swelling and pain of the belly for 2 days  Hx:  Past Medical History  Diagnosis Date  . Alcoholic hepatitis with ascites     liver bx march 2013, no cirrhosis.  Hep b/c negative  . Ascites     3 to 4 paracentesis from 2/28 to 06/10/11 at [facility] totalling  19 liters.  . Alcoholism /alcohol abuse 05/2011  . Pulmonary embolism 05/2011    sent home on Xarelto.  Marland Kitchen DVT (deep venous thrombosis) 05/2011  . Hepatitis    Related Meds:     . famotidine  20 mg Oral BID  . furosemide  40 mg Oral Daily  . piperacillin-tazobactam  3.375 g Intravenous Q8H  . piperacillin-tazobactam (ZOSYN)  IV  3.375 g Intravenous Q8H  . rivaroxaban  20 mg Oral Daily  . spironolactone  100 mg Oral Daily  . vancomycin  750 mg Intravenous Q12H  . DISCONTD: Rivaroxaban  15 mg Oral Daily  . DISCONTD: Rivaroxaban  15 mg Oral Daily   Ht: 6' (182.9 cm)  Wt: 118 lb 2.7 oz (53.6 kg)  Ideal Wt: 80.9 kg % Ideal Wt: 66%  Usual Wt: Pt states that his weight was without ascites Wt Readings from Last 10 Encounters:  07/09/11 118 lb 2.7 oz (53.6 kg)  06/28/11 142 lb 10.2 oz (64.7 kg)   % Usual Wt: 83% 17% wt loss x 1 month  Body mass index is 16.03 kg/(m^2). Underweight  Food/Nutrition Related Hx: Per pt his usual wt in February 2013 was 158 lbs (25% wt loss in 2 months). Pt states wt loss is related to his hospitalizations for his liver disease and pancreatitis.Pt reports not drinking ETOH since February 2013.   Pt with visible severe wasting--severe fat loss of his triceps, severe muscle loss of his temples and clavicles. Per pt he does not usually eat breakfast, usually eats a value meal at fast food place but has not been able to eat much of this recently due to pain/nausea/vomiting. After review of intake PTA it is clear that pt is consuming </= 75% of his estimated  needs.  Pt meets criteria for severe malnutrition in the context of chronic illness as evidenced by 17% wt loss x 1 month, pt consuming </= 75% of his estimated needs and visible wasting of fat and muscle as reported above. Pt reports consuming <50% of his breakfast this morning without pain but usually does have pain when eating.  Labs:  CMP     Component Value Date/Time   NA 129* 07/11/2011 0545   K 3.6 07/11/2011 0545   CL 92* 07/11/2011 0545   CO2 28 07/11/2011 0545   GLUCOSE 123* 07/11/2011 0545   BUN 11 07/11/2011 0545   CREATININE 0.61 07/11/2011 0545   CALCIUM 8.0* 07/11/2011 0545   PROT 6.0 07/09/2011 1238   ALBUMIN 2.2* 07/09/2011 1238   AST 43* 07/09/2011 1238   ALT 21 07/09/2011 1238   ALKPHOS 194* 07/09/2011 1238   BILITOT 0.3 07/09/2011 1238   GFRNONAA >90 07/11/2011 0545   GFRAA >90 07/11/2011 0545  CBG (last 3)  No results found for this basename: GLUCAP:3 in the last 72 hours Amylase: 947 07/09/11 Lipase: 467 07/09/11 Lipid Panel     Component Value Date/Time   CHOL 106 07/10/2011 0615   TRIG 148 07/10/2011 0615   HDL 10* 07/10/2011 0615  CHOLHDL 10.6 07/10/2011 0615   VLDL 30 07/10/2011 0615   LDLCALC 66 07/10/2011 0615    Intake/Output Summary (Last 24 hours) at 07/11/11 1058 Last data filed at 07/11/11 0500  Gross per 24 hour  Intake    780 ml  Output   1275 ml  Net   -495 ml    Diet Order: Heart Healthy  Supplements/Tube Feeding: none  .IVF:    DISCONTD: 0.9 % NaCl with KCl 20 mEq / L Last Rate: 50 mL/hr at 07/10/11 2203    Estimated Nutritional Needs:   Kcal: 1800-2000 Protein: 90-120 grams Fluid: >1.5 L/day  NUTRITION DIAGNOSIS: -Malnutrition (NI-5.2).  Status: Ongoing  RELATED TO: alcoholic hepatitis/pancreatitis/pain when eating  AS EVIDENCE BY: 17% wt loss x 72month  MONITORING/EVALUATION(Goals): Goal: Pt will consume >/= 90% of his estimated needs Monitor: po intake, weight  EDUCATION NEEDS: -No education needs identified at this time  INTERVENTION:  Resource  Breeze TID  Dietitian [phone]  DOCUMENTATION CODES Per approved criteria  -Severe malnutrition in the context of chronic illness -Underweight    Kendell Bane Cornelison 07/11/2011, 10:55 AM

## 2011-07-12 LAB — CBC
HCT: 28.5 % — ABNORMAL LOW (ref 39.0–52.0)
Hemoglobin: 9.5 g/dL — ABNORMAL LOW (ref 13.0–17.0)
MCH: 29.9 pg (ref 26.0–34.0)
MCHC: 33.3 g/dL (ref 30.0–36.0)

## 2011-07-12 LAB — COMPREHENSIVE METABOLIC PANEL
Alkaline Phosphatase: 234 U/L — ABNORMAL HIGH (ref 39–117)
BUN: 11 mg/dL (ref 6–23)
GFR calc Af Amer: >90 mL/min (ref >90)
GFR calc non Af Amer: >90 mL/min (ref >90)
Glucose, Bld: 132 mg/dL — ABNORMAL HIGH (ref 70–99)
Potassium: 3.6 mEq/L (ref 3.5–5.1)
Total Protein: 5.1 g/dL — ABNORMAL LOW (ref 6.0–8.3)

## 2011-07-12 LAB — LIPASE, BLOOD: Lipase: 43 U/L (ref 11–59)

## 2011-07-12 LAB — OPIATE, QUANTITATIVE, URINE: Hydromorphone GC/MS Conf: 597 NG/ML — ABNORMAL HIGH

## 2011-07-12 MED ORDER — CIPROFLOXACIN HCL 500 MG PO TABS: 500.0000 mg | ORAL_TABLET | Freq: Every morning | ORAL | Status: DC

## 2011-07-12 MED ORDER — MORPHINE SULFATE 2 MG/ML IJ SOLN
2.0000 mg | INTRAMUSCULAR | Status: DC | PRN
  Administered 2011-07-12 (×4): 2 mg via INTRAVENOUS
  Filled 2011-07-12 (×4): qty 1

## 2011-07-12 MED ORDER — LACTULOSE 10 GM/15ML PO SOLN
20.0000 g | Freq: Once | ORAL | Status: AC
  Administered 2011-07-12: 20 g via ORAL
  Filled 2011-07-12: qty 30

## 2011-07-12 MED ORDER — OXYCODONE HCL 5 MG PO TABS
5.0000 mg | ORAL_TABLET | ORAL | Status: DC | PRN
  Administered 2011-07-12 – 2011-07-13 (×5): 5 mg via ORAL
  Filled 2011-07-12 (×5): qty 1

## 2011-07-12 MED ORDER — CIPROFLOXACIN HCL 500 MG PO TABS
500.0000 mg | ORAL_TABLET | Freq: Two times a day (BID) | ORAL | Status: DC
  Administered 2011-07-13: 500 mg via ORAL
  Filled 2011-07-12 (×3): qty 1

## 2011-07-12 NOTE — Evaluation (Signed)
Physical Therapy Evaluation Patient Details Name: Antonio [name] MRN: [id number] DOB: 07/29/1970 Today's Date: 07/12/2011  Problem List:  Patient Active Problem List  Diagnoses  . Tachycardia  . Cirrhosis  . Coagulopathy  . Cachexia  . Hyponatremia  . Failure to thrive  . Ascites  . SBP (spontaneous bacterial peritonitis)  . Anemia  . Pancreatitis, acute  . Peritonitis  . Alcoholism  . Leukocytosis    Past Medical History:  Past Medical History  Diagnosis Date  . Alcoholic hepatitis with ascites     liver bx march 2013, no cirrhosis.  Hep b/c negative  . Ascites     3 to 4 paracentesis from 2/28 to 06/10/11 at [facility] totalling  19 liters.  . Alcoholism /alcohol abuse 05/2011  . Pulmonary embolism 05/2011    sent home on Xarelto.  Marland Kitchen DVT (deep venous thrombosis) 05/2011  . Hepatitis    Past Surgical History:  Past Surgical History  Procedure Date  . Ankle surgery   . Esophagogastroduodenoscopy 3.12.2013    Dr Braulio Conte in [place].  Irregular GE Jx, biopsied benign, naso/SB feeding tube inserted   . Paracentesis     several during admission at [facility] 2/26- 3/18  . Liver biopsy 06/2011    transjugular    PT Assessment/Plan/Recommendation PT Assessment Clinical Impression Statement: Patient presents with pancreatitis and peritonitis. He has been receiving HH PT to work on improving his functional activity tolerance and strength. 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 PT Recommendation/Assessment: All further PT needs can be met in the next venue of care PT Problem List:  (Decreased muscular endurance). Decreased activity tolerance. PT Recommendation Follow Up Recommendations: Home health PT;Supervision - Intermittent Equipment Recommended: None recommended by PT  PT Evaluation Precautions/Restrictions    Restrictions Weight Bearing Restrictions: No Prior Functioning  Home Living Lives With: Family (brother) Type of Home: Mobile home Home Layout: One level Home Access: Stairs to enter Entrance Stairs-Rails: Right;Left;Can reach both Entrance Stairs-Number of Steps: 3 Bathroom Shower/Tub: Engineer, manufacturing systems: Standard Home Adaptive Equipment: Straight cane Additional Comments: Using for outdoor ambulation Prior Function Level of Independence: Independent with basic ADLs;Independent with transfers;Independent with gait;Needs assistance with homemaking Cognition Cognition Arousal/Alertness: Awake/alert Overall Cognitive Status: Appears within functional limits for tasks assessed Sensation/Coordination Sensation Light Touch: Appears Intact Proprioception: Appears Intact Coordination Gross Motor Movements are Fluid and Coordinated: Yes Fine Motor Movements are Fluid and Coordinated: Yes Extremity Assessment RLE Assessment RLE Assessment: Within Functional Limits LLE Assessment LLE Assessment: Within Functional Limits Mobility (including Balance) Bed Mobility Supine to Sit: 7: Independent Sitting - Scoot to Edge of Bed: 7: Independent Sit to Supine: 7: Independent Transfers Sit to Stand: 7: Independent;From bed;With upper extremity assist;Without upper extremity assist Stand to Sit: 7: Independent;To bed;With upper extremity assist;Without upper extremity assist Ambulation/Gait Ambulation/Gait Assistance: 6: Modified independent (Device/Increase time) Ambulation/Gait Assistance Details (indicate cue type and reason): No safety issues noted - able to negotiate turns and tight spaces without difficulty. Speed of gait at least 2.5 feet/second - indicative of neighboorhood speeds. Ambulation Distance (Feet): 400 Feet Assistive device: Straight cane Gait Pattern: Within Functional Limits Stairs: No - patient declines  Posture/Postural Control Posture/Postural Control:   (requiring cane for generalized weak feeling.) High Level Balance - no evidence of imbalance High Level Balance Activites: Side stepping;Direction changes;Turns;Sudden stops;Head turns End of Session PT - End  of Session Equipment Utilized During Treatment: Gait belt Activity Tolerance: Patient tolerated treatment well Patient left: in bed;with call bell in reach;with bed alarm set Nurse Communication: Mobility status for ambulation General Behavior During Session: [facility] for tasks performed Cognition: [facility] for tasks performed  Edwyna Perfect, PT  Pager [phone]  07/12/2011, 10:00 AM

## 2011-07-12 NOTE — Discharge Summary (Signed)
Discharge Summary  Antonio [name] Antonio#: [id number]  DOB:March 07, 1971  Date of Admission: 07/09/2011 Date of Discharge: 07/13/2011  Patient's PCP: Berlinda Last, DO  Attending Physician:Khaleef Ruby    Discharge Diagnoses: Active Problems:  Pancreatitis, acute  Peritonitis  Alcoholism  Leukocytosis   Brief Admitting History and Physical Antonio [name] presents with pain and increasing abdominal girth. He was discharged from the hospital on 07/01/11, after treatment for peritonitis, presumed infectious, although culture negative. He does not seem to have cirrhosis from record review, although he has history of alcoholism. Gi saw him in the last admission and recommended antibiotics,(supposed to take for secondary prophylaxis for 3 months but only took 3 day supply given at d/c and did not have prescription filled for the rest). He comes back with diffuse abdominal pain, more marked on the left side, associated with recurrence of abdominal swelling. He has also been constipated and vomited. He was also on xarelto for?PE. In ED he was found to [place]bc [place] [id number]. He denies cough or sob. He has been given vanc/zosyn and referred for admission.   Discharge Medications Medication List  As of 07/13/2011 10:01 AM   STOP taking these medications         oxycodone 5 MG capsule         TAKE these medications         ciprofloxacin 500 MG tablet   Commonly known as: CIPRO   Take 1 tablet (500 mg total) by mouth 2 (two) times daily.-- FOLLOWED BY THE NEXT CIPRO PRESCRIPTION      ciprofloxacin 500 MG tablet   Commonly known as: CIPRO   Take 1 tablet (500 mg total) by mouth every morning. FOR 3 MONTHS      famotidine 20 MG tablet   Commonly known as: PEPCID   Take 20 mg by mouth 2 (two) times daily.      furosemide 40 MG tablet   Commonly known as: LASIX   Take 40 mg by mouth daily.      lactulose 10 GM/15ML solution   Commonly known as: CHRONULAC   Take 20 g by mouth daily.     omeprazole 20 MG capsule   Commonly known as: PRILOSEC   Take 20 mg by mouth 2 (two) times daily.      oxyCODONE 5 MG immediate release tablet   Commonly known as: Oxy IR/ROXICODONE   Take 1 tablet (5 mg total) by mouth every 4 (four) hours as needed.      prenatal vitamin w/FE, FA 27-1 MG Tabs   Take 1 tablet by mouth daily.      promethazine 12.5 MG tablet   Commonly known as: PHENERGAN   Take 12.5 mg by mouth every 6 (six) hours as needed. For nausea and vomiting      Rivaroxaban 15 MG Tabs tablet   Commonly known as: XARELTO   Take 15 mg by mouth daily.      senna-docusate 8.6-50 MG per tablet   Commonly known as: Senokot-S   Take 2 tablets by mouth once.      spironolactone 100 MG tablet   Commonly known as: ALDACTONE   Take 1 tablet (100 mg total) by mouth daily.            Hospital Course: .Pancreatitis, acute- ?alcoholic related versus other causes. consider mrcp as outpatient once patient can tolerate procedure better   .Peritonitis- presumed infectious, apparently non-cirrhotic. Repeat ultrasound guided paracentesis with repeat cell count and culture.- NGTD  cover with vanc/zosyn. kub to shows no obstruction only ascities. Plan to continue IV abx til WBCs normalize then plan to switch to PO abx- patient was not compliant with his previous abx on d/c- Stressed with patient the need for compliance with medications  .Alcoholism- folate/thiamine.   .Leukocytosis- likely due to peritonitis. 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  He says he is not able to have  BMs until he goes home.  RRR, no murmur Clear ant Belly firm but not tender, thin cahexic -c/c/e   Results for orders placed during the hospital encounter of 07/09/11 (from the past 48 hour(s))  CBC     Status: Abnormal   Collection Time   07/12/11  6:15 AM      Component Value Range Comment   WBC 11.5 (*) 4.0 - 10.5 (K/uL)    RBC 3.18 (*) 4.22 - 5.81 (MIL/uL)    Hemoglobin 9.5 (*) 13.0 - 17.0 (g/dL)    HCT 16.1 (*) 09.6 - 52.0 (%)    MCV 89.6  78.0 - 100.0 (fL)    MCH 29.9  26.0 - 34.0 (pg)    MCHC 33.3  30.0 - 36.0 (g/dL)    RDW 04.5  40.9 - 81.1 (%)    Platelets 257  150 - 400 (K/uL)   COMPREHENSIVE METABOLIC PANEL     Status: Abnormal   Collection Time   07/12/11  6:15 AM      Component Value Range Comment   Sodium 128 (*) 135 - 145 (mEq/L)    Potassium 3.6  3.5 - 5.1 (mEq/L)    Chloride 89 (*) 96 - 112 (mEq/L)    CO2 30  19 - 32 (mEq/L)    Glucose, Bld 132 (*) 70 - 99 (mg/dL)    BUN 11  6 - 23 (mg/dL)    Creatinine, Ser 9.14  0.50 - 1.35 (mg/dL)    Calcium 8.0 (*) 8.4 - 10.5 (mg/dL)    Total Protein 5.1 (*) 6.0 - 8.3 (g/dL)    Albumin 1.7 (*) 3.5 - 5.2 (g/dL)    AST 32  0 - 37 (U/L)    ALT 19  0 - 53 (U/L)    Alkaline Phosphatase 234 (*) 39 - 117 (U/L)    Total Bilirubin 0.3  0.3 - 1.2 (mg/dL)    GFR calc non Af Amer >90  >90 (mL/min)    GFR calc Af Amer >90  >90 (mL/min)   LIPASE, BLOOD     Status: Normal   Collection Time   07/12/11  6:15 AM      Component Value Range Comment   Lipase 43  11 - 59 (U/L)     Dg Chest 2 View  07/09/2011  *RADIOLOGY REPORT*  Clinical Data: Shortness of breath, ascites  CHEST - 2 VIEW  Comparison: 06/21/2011  Findings: Cardiomediastinal silhouette is stable.  There is atelectasis in the right middle lobe.  Persistent residual nodular infiltrate in the right lower lobe laterally measures 1.2 cm.  No new infiltrate  is noted.  Follow-up examination to assure complete resolution is recommended.  IMPRESSION: There is atelectasis in the right  middle lobe.  Persistent residual nodular infiltrate in the right lower lobe laterally measures 1.2 cm.  No new infiltrate is noted.  Follow-up examination to assure complete resolution is recommended.  Original Report Authenticated By: Natasha Mead, M.D.   Dg Abd 1 View  07/09/2011  *RADIOLOGY REPORT*  Clinical Data: Abdominal pain, vomiting  ABDOMEN - 1 VIEW  Comparison: CT abdomen pelvis of 06/27/2011  Findings: Both large and small bowel gas is present without distention.  There is haziness throughout the abdomen suggestive of a moderate to large amount of ascites.  No opaque calculi are seen. No bony abnormality is noted.  IMPRESSION: No bowel obstruction.  Probable ascites.  Original Report Authenticated By: Juline Patch, M.D.   Ct Abdomen Pelvis W Contrast  06/27/2011  *RADIOLOGY REPORT*  Clinical Data: Diffuse abdominal pain, ascites which appeared infected at paracentesis, question source; past history DVT, pulmonary embolism, alcoholic hepatitis  CT ABDOMEN AND PELVIS WITH CONTRAST  Technique:  Multidetector CT imaging of the abdomen and pelvis was performed following the standard protocol during bolus administration of intravenous contrast. Sagittal and coronal MPR images reconstructed from axial data set.  Contrast:  Dilute oral contrast.  80 ml Omnipaque-300 IV  Comparison: 06/07/2011  Findings: Bibasilar small pleural effusions with minimal compressive atelectasis of lower lobes. More focal areas of parenchymal opacity/volume loss are seen at the bases of the right middle and right lower lobes, question atelectasis versus pulmonary infarcts in patient with known large pulmonary emboli at these sites. Liver, spleen, pancreas, kidneys, and adrenal glands normal appearance. Contracted gallbladder.  Significant ascites with newly identified significant peritoneal enhancement highly suggestive of peritonitis. Diffuse dilatation of small bowel loops without evidence of obstruction. Normal appendix.  Normal-appearing bladder. No definite mass, adenopathy or hernia. Colon and stomach unremarkable. No acute osseous findings. Bones appear questionably demineralized.  IMPRESSION: Significant ascites with newly identified peritoneal enhancement diffusely highly suggestive of peritonitis. No focal identifiable cause for peritonitis is identified. Small bibasilar pleural effusions with atelectasis versus infarcts at right lung base.  Original Report Authenticated By: Lollie Marrow, M.D.   US Paracentesis  07/12/2011  *RADIOLOGY REPORT*  Clinical Data: Cirrhosis; ascites  ULTRASOUND GUIDED PARACENTESIS  Comparison:  None  An ultrasound guided paracentesis was thoroughly discussed with the patient and questions answered.  The benefits, risks, alternatives and complications were also discussed.  The patient understands and wishes to proceed with the procedure.  Written consent was obtained.  Ultrasound was performed to localize and mark an adequate pocket of fluid in the left lower quadrant of the abdomen.  The area was then prepped and draped in the normal sterile fashion.  1% Lidocaine was used for local anesthesia.  Under ultrasound guidance a 19 gauge Yueh catheter was introduced.  Paracentesis was performed.  The catheter was removed and a dressing applied.  Complications:  None  Findings:  A total of approximately 3.3 liters of brownish fluid was removed.  A fluid sample was sent for laboratory analysis.  IMPRESSION: Successful ultrasound guided paracentesis yielding 3.3 liters of ascites.  Read by: Ralene Muskrat, P.A.-C  Original Report Authenticated By: Waynard Reeds, M.D.   US Paracentesis  06/28/2011  *RADIOLOGY REPORT*  Clinical Data: Abdominal ascites; cirrhosis  ULTRASOUND GUIDED PARACENTESIS  Comparison:  Previous paracentesis  An ultrasound guided paracentesis was thoroughly discussed with the patient and questions answered.  The benefits, risks, alternatives and complications were also discussed.   The patient understands and wishes to proceed with the procedure.  Written consent was obtained.  Ultrasound was performed to localize and mark an adequate pocket of fluid in the right lower quadrant of the abdomen.  The area was then prepped and draped in the normal sterile fashion.  1% Lidocaine was used for local anesthesia.  Under ultrasound guidance a 19 gauge Yueh catheter was introduced.  Paracentesis was performed.  The catheter was removed and a dressing applied.  Complications:  None  Findings:  A total of approximately 1.9 liters of dark brown fluid was removed.  A fluid sample was sent for laboratory analysis.  IMPRESSION: Successful ultrasound guided paracentesis yielding 1.9 liters of ascites.  Read by: Ralene Muskrat, P.A.-C  Original Report Authenticated By: Judie Petit. Ruel Favors, M.D.   US Paracentesis  06/25/2011  *RADIOLOGY REPORT*  Clinical Data: Abdominal ascites; cirrhosis  ULTRASOUND GUIDED PARACENTESIS  Comparison:  Previous paracentesis  An ultrasound guided paracentesis was thoroughly discussed with the patient and questions answered.  The benefits, risks, alternatives and complications were also discussed.  The patient understands and wishes to proceed with the procedure.  Written consent was obtained.  Ultrasound was performed to localize and mark an adequate pocket of fluid in the left lower quadrant of the abdomen.  The area was then prepped and draped in the normal sterile fashion.  1% Lidocaine was used for local anesthesia.  Under ultrasound guidance a 19 gauge Yueh catheter was introduced.  Paracentesis was performed.  The catheter was removed and a dressing applied.  Complications:  None  Findings:  A total of approximately 6 liters of dark brown fluid was removed.  A fluid sample was not sent for laboratory analysis.  IMPRESSION: Successful ultrasound guided paracentesis yielding 6 liters of ascites.  Read by: Ralene Muskrat, P.A.-C  Original Report Authenticated By: Richarda Overlie, M.D.     Dg Chest Portable 1 View  06/21/2011  *RADIOLOGY REPORT*  Clinical Data: Fatigue, tachycardia, cough  PORTABLE CHEST - 1 VIEW  Comparison: 06/03/2011  Findings: Cardiomediastinal silhouette is stable.  Study is limited by poor inspiration.  Mild right basilar atelectasis. Improvement in aeration in the right lower lobe with decreasing size of the right lateral focal infiltrate measures about 1.2 cm.  Follow-up examination to assure complete resolution is recommended.  No new infiltrate or pulmonary edema.  IMPRESSION:  Study is limited by poor inspiration.  Mild right basilar atelectasis. Improvement in aeration in the right lower lobe with decreasing size of the right lateral focal infiltrate measures about 1.2 cm.  Follow-up examination to assure complete resolution is recommended.  No new infiltrate or pulmonary edema.  Original Report Authenticated By: Natasha Mead, M.D.     Disposition: home with home health  Diet: cardiac  Activity: as tolerate   Follow-up Appts: Discharge Orders    Future Orders Please Complete By Expires   Diet - low sodium heart healthy      Increase activity slowly      Discharge instructions      Comments:   Be sure to take medications as prescribed       Time spent on discharge, talking to the patient, and coordinating care: 55 mins.   SignedMarlin Canary, DO 07/13/2011, 10:01 AM

## 2011-07-12 NOTE — Progress Notes (Addendum)
Subjective:   Abdominal pain better, no BM for 3 days per patient   Objective: Vital signs in last 24 hours: Filed Vitals:   07/11/11 0954 07/11/11 1320 07/11/11 2131 07/12/11 0527  BP: 101/76 100/71 103/78 94/70  Pulse: 104 111 119 101  Temp: 97.8 F (36.6 C) 98.1 F (36.7 C) 98.1 F (36.7 C) 97.8 F (36.6 C)  TempSrc: Oral  Oral Oral  Resp: 20 16 18 16   Height:      Weight:      SpO2: 98% 96% 95% 95%   Weight change:   Intake/Output Summary (Last 24 hours) at 07/12/11 0816 Last data filed at 07/12/11 0548  Gross per 24 hour  Intake   1210 ml  Output   1250 ml  Net    -40 ml    Physical Exam: General: Awake, Oriented, cahexic HEENT: EOMI. Neck: Supple CV: S1 and S2,rrr Lungs: Clear to ascultation bilaterally, no wheezing Abdomen: Soft, Nontender, Nondistended, +bowel sounds. Ext: Good pulses. Trace edema.   Lab Results:  Basename 07/12/11 0615 07/11/11 0545 07/10/11 0615  NA 128* 129* --  K 3.6 3.6 --  CL 89* 92* --  CO2 30 28 --  GLUCOSE 132* 123* --  BUN 11 11 --  CREATININE 0.61 0.61 --  CALCIUM 8.0* 8.0* --  MG -- -- 1.8  PHOS -- -- 3.1    Basename 07/12/11 0615 07/09/11 1238  AST 32 43*  ALT 19 21  ALKPHOS 234* 194*  BILITOT 0.3 0.3  PROT 5.1* 6.0  ALBUMIN 1.7* 2.2*    Basename 07/12/11 0615 07/10/11 0615 07/09/11 1526  LIPASE 43 165* --  AMYLASE -- 607* 947*    Basename 07/12/11 0615 07/11/11 0545 07/09/11 1238  WBC 11.5* 17.2* --  NEUTROABS -- -- 22.9*  HGB 9.5* 9.8* --  HCT 28.5* 30.0* --  MCV 89.6 89.6 --  PLT 257 301 --   No results found for this basename: CKTOTAL:3,CKMB:3,CKMBINDEX:3,TROPONINI:3 in the last 72 hours No components found with this basename: POCBNP:3 No results found for this basename: DDIMER:2 in the last 72 hours No results found for this basename: HGBA1C:2 in the last 72 hours  Basename 07/10/11 0615  CHOL 106  HDL 10*  LDLCALC 66  TRIG 960  CHOLHDL 10.6  LDLDIRECT --   No results found for this  basename: TSH,T4TOTAL,FREET3,T3FREE,THYROIDAB in the last 72 hours No results found for this basename: VITAMINB12:2,FOLATE:2,FERRITIN:2,TIBC:2,IRON:2,RETICCTPCT:2 in the last 72 hours  Micro Results: Recent Results (from the past 240 hour(s))  CULTURE, BLOOD (ROUTINE X 2)     Status: Normal (Preliminary result)   Collection Time   07/09/11  4:10 PM      Component Value Range Status Comment   Specimen Description BLOOD ARM LEFT   Final    Special Requests BOTTLES DRAWN AEROBIC AND ANAEROBIC 10CC   Final    Culture  Setup Time [id number]   Final    Culture     Final    Value:        BLOOD CULTURE RECEIVED NO GROWTH TO DATE CULTURE WILL BE HELD FOR 5 DAYS BEFORE ISSUING A FINAL NEGATIVE REPORT   Report Status PENDING   Incomplete   CULTURE, BLOOD (ROUTINE X 2)     Status: Normal (Preliminary result)   Collection Time   07/09/11  4:20 PM      Component Value Range Status Comment   Specimen Description BLOOD ARM LEFT   Final    Special Requests BOTTLES DRAWN  AEROBIC AND ANAEROBIC 10CC   Final    Culture  Setup Time [id number]   Final    Culture     Final    Value:        BLOOD CULTURE RECEIVED NO GROWTH TO DATE CULTURE WILL BE HELD FOR 5 DAYS BEFORE ISSUING A FINAL NEGATIVE REPORT   Report Status PENDING   Incomplete   URINE CULTURE     Status: Normal   Collection Time   07/09/11  7:24 PM      Component Value Range Status Comment   Specimen Description URINE, CLEAN CATCH   Final    Special Requests NONE   Final    Culture  Setup Time [id number]   Final    Colony Count NO GROWTH   Final    Culture NO GROWTH   Final    Report Status 07/10/2011 FINAL   Final   BODY FLUID CULTURE     Status: Normal (Preliminary result)   Collection Time   07/10/11 10:46 AM      Component Value Range Status Comment   Specimen Description FLUID ASCITIC   Final    Special Requests NONE   Final    Gram Stain     Final    Value: RARE WBC PRESENT, PREDOMINANTLY PMN     NO ORGANISMS SEEN   Culture NO GROWTH  1 DAY   Final    Report Status PENDING   Incomplete     Studies/Results: No results found.  Medications: I have reviewed the patient's current medications. Scheduled Meds:    . famotidine  20 mg Oral BID  . feeding supplement  1 Container Oral TID BM  . furosemide  40 mg Oral Daily  . piperacillin-tazobactam (ZOSYN)  IV  3.375 g Intravenous Q8H  . rivaroxaban  20 mg Oral Daily  . spironolactone  100 mg Oral Daily  . vancomycin  750 mg Intravenous Q12H  . DISCONTD: Rivaroxaban  15 mg Oral Daily  . DISCONTD: Rivaroxaban  15 mg Oral Daily   Continuous Infusions:    . DISCONTD: 0.9 % NaCl with KCl 20 mEq / L 50 mL/hr at 07/10/11 2203   PRN Meds:.acetaminophen, acetaminophen, morphine, ondansetron, promethazine  Assessment/Plan: 41 year old male with history of alcoholism, here with apparent recurrent acute peritonitis, whose etiology is not clear- he did not take his antibiotics as previously prescribed. He has evidence of acute pancreatitis, which could be the source of recurrent inflammation. Patient anticoagulated for ?PE.   Plan .Pancreatitis, acute- ?alcoholic related versus other causes. Urine drug screen/lipids panel/follow amylase/lipase, consider mrcp as outpatient once patient can tolerate procedure better  .Peritonitis- presumed infectious, apparently non-cirrhotic. Repeat ultrasound guided paracentesis with repeat cell count and culture. Meanwhile cover with vanc/zosyn. kub to shows no obstruction only ascities. Plan to continue IV abx til WBCs normalize then plan to switch to PO abx- patient was not compliant with his previous abx on d/c  .Alcoholism- folate/thiamine.  .Leukocytosis- likely due to peritonitis. 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

## 2011-07-12 NOTE — Progress Notes (Signed)
Patient Antonio [name], 41 year old white male is struggling with acute pancreatis.  Patient is anxious about returning to his home in [place], [place] where he lives with his brother.   Patient expression for Chaplain's provision of pastoral prayer, presence, and conversation.  I will follow-up as needed.

## 2011-07-12 NOTE — Progress Notes (Signed)
07/12/2011 [facility], [place]ie Clos [facility] Case Management Note [phone]    CARE MANAGEMENT NOTE 07/12/2011  Patient:  [facility]   Account Number:  [id number]  Date Initiated:  07/11/2011  Documentation initiated by:  Fransico Michael  Subjective/Objective Assessment:   admitted on 07/10/11 with c/o abdominal pain also noted to have alcohol abuse and hepatitis.     Action/Plan:   prior to admission, patient lived at home alone with support from friends   Anticipated DC Date:  07/14/2011   Anticipated DC Plan:  HOME/SELF CARE      DC Planning Services  CM consult      Choice offered to / List presented to:             Status of service:  In process, will continue to follow Medicare Important Message given?   (If response is "NO", the following Medicare IM given date fields will be blank) Date Medicare IM given:   Date Additional Medicare IM given:    Discharge Disposition:    Per UR Regulation:  Reviewed for med. necessity/level of care/duration of stay  If discussed at Long Length of Stay Meetings, dates discussed:    Comments:  PCP: none  Contact: none  07/12/11-1542-J.Jackline Castilla,RN,BSN [phone]      Received fax from [facility] outpatient clinics with patient's appointment. Appointment is for 08/12/11 at 1615. Information sheet from clinics given to patient. And appointment information placed on discharge instructions.  07/11/11-1549-J.Pearlean Sabina,RN,BSN [phone]     Called the [facility] outpatient clinics at [phone]. Spoke with Antoinette who reported that referral was received but that appointment would not be made today. Also checked with main pharmacy, per [facility], patient is elligible for [facility] fund. CM will continue to follow.  07/11/11-1342-J.Lutricia Horsfall  [phone]      41yo male patient admitted on 07/10/11 with epigastric pain. Noted to have hepatitis and alcohol abuse. Prior to admission, patient lived at home alone with support from friends. Noted that patient does not have PCP on chart.  In to speak with patient. Information on [facility] given to patient for post discharge follow up. Per MD request CM sent referral for appointment at [facility] outpatient clinics for her ovarian cysts. CM will continue to follow.

## 2011-07-12 NOTE — Progress Notes (Signed)
07/12/2011 [facility], Bosie Clos [facility] Case Management Note [phone]    CARE MANAGEMENT NOTE 07/12/2011  Patient:  Antonio [name], Antonio [name]   Account Number:  [id number]  Date Initiated:  07/10/2011  Documentation initiated by:  [facility]  Subjective/Objective Assessment:   Admitted with peritonitis, acute pancreatitis. Lives with family, had HHRN with [facility].     Action/Plan:   Anticipated DC Date:  07/13/2011   Anticipated DC Plan:  HOME W HOME HEALTH SERVICES      DC Planning Services  CM consult      Choice offered to / List presented to:             Status of service:  In process, will continue to follow Medicare Important Message given?   (If response is "NO", the following Medicare IM given date fields will be blank) Date Medicare IM given:   Date Additional Medicare IM given:    Discharge Disposition:    Per UR Regulation:    If discussed at Long Length of Stay Meetings, dates discussed:    Comments:  PCP Dr. Gabriel Cirri  07/12/11-1529-J.Taetum Flewellen,RN,BSN [phone]      Called to speak with Robin with [facility] at [phone]. Physical therapy evaluated and recommended home health PT. Left a message for her to contact me regarding HH vs hospice. Anticipated discharge to home with home health tomorrow, saturday 07/13/11. Awaiting return call.  07/11/11-1432-J.Zell Hylton,RN,BSN [phone]      In to speak with patient regarding [place] home health and potential change to [facility]. Patient denies any knowledge of Hospice's involvement. Awaiting PT eval and recommendations and disposition plans.  07/10/11 Went to speak with patent, he was sound asleep.Per notes from patient's admission in March, he is active with [facility]. [facility] and verified that they were actively seeing the patient but that it was their understanding that patient was switching to [facility] [phone]. Contact at [facility] is [place].  Will contact hopsice after speaking with the patient.CM will continue to follow.Jacquelynn Cree RN, BSN, CCM

## 2011-07-12 NOTE — Progress Notes (Signed)
ANTIBIOTIC CONSULT NOTE - FOLLOW UP  Pharmacy Consult for : Vancomycin Indication: Peritonitis  No Known Allergies  Patient Measurements: Height: 6' (182.9 cm) Weight: 118 lb 2.7 oz (53.6 kg) IBW/kg (Calculated) : 77.6    Vital Signs: Temp Readings from Last 3 Encounters:  07/12/11 97.8 F (36.6 C) Oral  07/01/11 98.2 F (36.8 C) Oral    Intake/Output from previous day: 04/04 0701 - 04/05 0700 In: 1330 [P.O.:1080; IV Piggyback:250] Out: 1650 [Urine:1650]  Labs:  [facility] 07/12/11 0615 07/11/11 0545  WBC 11.5* 17.2*  HGB 9.5* 9.8*  PLT 257 301  LABCREA -- --  CREATININE 0.61 0.61   Estimated Creatinine Clearance: 94.9 ml/min (by C-G formula based on Cr of 0.61).    Microbiology: Recent Results (from the past 720 hour(s))  BODY FLUID CULTURE     Status: Normal   Collection Time   06/27/11  9:13 AM      Component Value Range Status Comment   Specimen Description ABDOMEN ASCITIC FLUID   Final    Special Requests   Final    Gram Stain     Final    Value: MODERATE WBC PRESENT, PREDOMINANTLY PMN     NO ORGANISMS SEEN   Culture NO GROWTH 3 DAYS   Final    Report Status 06/30/2011 FINAL   Final   CULTURE, BLOOD (ROUTINE X 2)     Status: Normal (Preliminary result)   Collection Time   07/09/11  4:10 PM      Component Value Range Status Comment   Specimen Description BLOOD ARM LEFT   Final    Special Requests BOTTLES DRAWN AEROBIC AND ANAEROBIC 10CC   Final    Culture  Setup Time [id number]   Final    Culture     Final    Value:        BLOOD CULTURE RECEIVED NO GROWTH TO DATE CULTURE WILL BE HELD FOR 5 DAYS BEFORE ISSUING A FINAL NEGATIVE REPORT   Report Status PENDING   Incomplete   CULTURE, BLOOD (ROUTINE X 2)     Status: Normal (Preliminary result)   Collection Time   07/09/11  4:20 PM      Component Value Range Status Comment   Specimen Description BLOOD ARM LEFT   Final    Special Requests BOTTLES DRAWN AEROBIC AND ANAEROBIC 10CC   Final    Culture   Setup Time [id number]   Final    Culture     Final    Value:        BLOOD CULTURE RECEIVED NO GROWTH TO DATE CULTURE WILL BE HELD FOR 5 DAYS BEFORE ISSUING A FINAL NEGATIVE REPORT   Report Status PENDING   Incomplete   URINE CULTURE     Status: Normal   Collection Time   07/09/11  7:24 PM      Component Value Range Status Comment   Specimen Description URINE, CLEAN CATCH   Final    Special Requests NONE   Final    Culture  Setup Time [id number]   Final    Colony Count NO GROWTH   Final    Culture NO GROWTH   Final    Report Status 07/10/2011 FINAL   Final   BODY FLUID CULTURE     Status: Normal (Preliminary result)   Collection Time   07/10/11 10:46 AM      Component Value Range Status Comment   Specimen Description FLUID ASCITIC   Final  Special Requests NONE   Final    Gram Stain     Final    Value: RARE WBC PRESENT, PREDOMINANTLY PMN     NO ORGANISMS SEEN   Culture NO GROWTH 2 DAYS   Final    Report Status PENDING   Incomplete     Anti-infectives Anti-infectives     Start     Dose/Rate Route Frequency Ordered Stop   07/23/11 1000   ciprofloxacin (CIPRO) tablet 500 mg        500 mg Oral  Every morning - 10a 07/12/11 1225 09/21/11 0959   07/13/11 0800   ciprofloxacin (CIPRO) tablet 500 mg        500 mg Oral 2 times daily 07/12/11 1225 07/23/11 0759   07/10/11 2200  piperacillin-tazobactam (ZOSYN) IVPB 3.375 g       3.375 g 12.5 mL/hr over 240 Minutes Intravenous 3 times per day 07/10/11 1413 07/12/11 2359   07/10/11 0200   vancomycin (VANCOCIN) 750 mg in sodium chloride 0.9 % 150 mL IVPB        750 mg 150 mL/hr over 60 Minutes Intravenous Every 12 hours 07/09/11 1624     07/09/11 1615  piperacillin-tazobactam (ZOSYN) IVPB 3.375 g       3.375 g 100 mL/hr over 30 Minutes Intravenous 3 times per day 07/09/11 1602 07/10/11 1549   07/09/11 1415   vancomycin (VANCOCIN) IVPB 1000 mg/200 mL premix        1,000 mg 200 mL/hr over 60 Minutes Intravenous  Once 07/09/11 1410  07/09/11 1529   07/09/11 1330  piperacillin-tazobactam (ZOSYN) IVPB 3.375 g       3.375 g 12.5 mL/hr over 240 Minutes Intravenous  Once 07/09/11 1327 07/09/11 1758          Assessment:  Patient is a 41 y/o male on Day #4 of Vancomycin and Zosyn for Peritonitis- presumed infectious, apparently non-cirrhotic.   Afebrile.  WBC's down  17.2  >>  11.5.  Renal function stable.  Noted that patient was NOT compliant with his previous antibiotics on last discharge from the hospital.  Goal of Therapy:   Vancomycin trough level 10-15 mcg/ml  Plan:   Discontinue Zosyn after today's doses (as per MD).  Begin oral Cipro on 07/13/11 for extended outpatient treatment schedule.  Continue Vancomycin as ordered.  If Vancomycin is to be extended past 5 days, will check Vancomycin trough.   Alois Colgan, Elisha Headland, Pharm.D. 07/12/2011 12:55 PM

## 2011-07-13 LAB — BODY FLUID CULTURE

## 2011-07-13 MED ORDER — SPIRONOLACTONE 100 MG PO TABS: 100.0000 mg | ORAL_TABLET | Freq: Every day | ORAL | Status: DC

## 2011-07-13 MED ORDER — CIPROFLOXACIN HCL 500 MG PO TABS: 500.0000 mg | ORAL_TABLET | Freq: Every morning | ORAL | Status: AC

## 2011-07-13 MED ORDER — SENNOSIDES-DOCUSATE SODIUM 8.6-50 MG PO TABS: 2.0000 | ORAL_TABLET | Freq: Once | ORAL | Status: AC

## 2011-07-13 MED ORDER — OXYCODONE HCL 5 MG PO TABS: 5.0000 mg | ORAL_TABLET | ORAL | Status: AC | PRN

## 2011-07-13 MED ORDER — SENNOSIDES-DOCUSATE SODIUM 8.6-50 MG PO TABS
2.0000 | ORAL_TABLET | Freq: Once | ORAL | Status: AC
  Administered 2011-07-13: 2 via ORAL
  Filled 2011-07-13: qty 2

## 2011-07-13 MED ORDER — CIPROFLOXACIN HCL 500 MG PO TABS: 500.0000 mg | ORAL_TABLET | Freq: Two times a day (BID) | ORAL | Status: AC

## 2011-07-13 NOTE — Progress Notes (Signed)
1250, patient's  Iv was dc'd, discharge instructions given, home medications gone over, prescriptions given. Patient is to make two follow up appointments with physicians. He verbakized understanding of instructions ana was discharged,

## 2011-07-14 NOTE — Progress Notes (Signed)
   CARE MANAGEMENT NOTE 07/14/2011  Patient:  Antonio [name], Antonio [name]   Account Number:  [id number]  Date Initiated:  07/10/2011  Documentation initiated by:  [facility]  Subjective/Objective Assessment:   Admitted with peritonitis, acute pancreatitis. Lives with family, had HHRN with [facility].     Action/Plan:   Anticipated DC Date:  07/13/2011   Anticipated DC Plan:  HOME W HOME HEALTH SERVICES      DC Planning Services  CM consult      [facility] Choice  Resumption Of Svcs/PTA Provider   Choice offered to / List presented to:  C-1 Patient        HH arranged  HH-2 PT      [facility] agency  [facility] HEALTH   Status of service:  Completed, signed off Medicare Important Message given?   (If response is "NO", the following Medicare IM given date fields will be blank) Date Medicare IM given:   Date Additional Medicare IM given:    Discharge Disposition:  HOME W HOME HEALTH SERVICES  Per UR Regulation:    If discussed at Long Length of Stay Meetings, dates discussed:    Comments:  07/14/2011 1600 Faxed resumption of care order for [facility] PT to [facility]. Antonio Donning RN CCM Case Mgmt phone [phone]  PCP Dr. Gabriel Cirri  07/12/11-1529-J.Minnich,RN,BSN [phone]      Called to speak with Antonio [name] with [facility] at [phone]. Physical therapy evaluated and recommended home health PT. Left a message for her to contact me regarding HH vs hospice. Anticipated discharge to home with home health tomorrow, saturday 07/13/11. Awaiting return call.  07/11/11-1432-J.Minnich,RN,BSN [phone]      In to speak with patient regarding [place] home health and potential change to [facility]. Patient denies any knowledge of Hospice's involvement. Awaiting PT eval and recommendations and disposition plans.  07/10/11 Went to speak with patent, he was sound asleep.Per notes from patient's admission in March, he is active with [facility]. [facility] and verified that they were actively seeing the patient but that it was their understanding that patient was switching to [facility] [place] [phone]. Contact at [facility] is [place]. Will contact hopsice after speaking with the patient.CM will continue to follow.Antonio Cree RN, BSN, CCM

## 2011-07-15 NOTE — Progress Notes (Signed)
   CARE MANAGEMENT NOTE 07/15/2011  Patient:  Antonio [name], Antonio [name]   Account Number:  [id number]  Date Initiated:  07/10/2011  Documentation initiated by:  [facility]  Subjective/Objective Assessment:   Admitted with peritonitis, acute pancreatitis. Lives with family, had HHRN with [facility].     Action/Plan:   Anticipated DC Date:  07/13/2011   Anticipated DC Plan:  HOME W HOME HEALTH SERVICES      DC Planning Services  CM consult      [facility] Choice  Resumption Of Svcs/PTA Provider   Choice offered to / List presented to:  C-1 Patient        HH arranged  HH-2 PT  HH-1 RN      [facility] agency  [facility] HEALTH   Status of service:  Completed, signed off Medicare Important Message given?   (If response is "NO", the following Medicare IM given date fields will be blank) Date Medicare IM given:   Date Additional Medicare IM given:    Discharge Disposition:  HOME W HOME HEALTH SERVICES  Per UR Regulation:    If discussed at Long Length of Stay Meetings, dates discussed:    Comments:  07/15/2011 0930 Contacted [facility] and fax was not received. Verified fax number. Refaxed referral. Spoke to [place] and they received referral. Explained order was to resume previous orders. States he had [facility] RN for previous orders and they will add HH PT.  Isidoro Donning RN CCM Case Mgmt phone [phone]  07/14/2011 1600 Faxed resumption of care order for [facility] PT to [facility]. Isidoro Donning RN CCM Case Mgmt phone [phone]  PCP Dr. Gabriel Cirri  07/12/11-1529-J.Minnich,RN,BSN [phone]      Called to speak with Robin with [facility] at [phone]. Physical therapy evaluated and recommended home health PT. Left a message for her to contact me regarding HH vs hospice. Anticipated discharge to home with home health tomorrow, saturday 07/13/11. Awaiting return call.  07/11/11-1432-J.Minnich,RN,BSN [phone]      In to speak with patient  regarding [place] home health and potential change to [facility]. Patient denies any knowledge of Hospice's involvement. Awaiting PT eval and recommendations and disposition plans.  07/10/11 Went to speak with patent, he was sound asleep.Per notes from patient's admission in March, he is active with [facility]. [facility] and verified that they were actively seeing the patient but that it was their understanding that patient was switching to [facility] [place] [phone]. Contact at [facility] is [place]. Will contact hopsice after speaking with the patient.CM will continue to follow.Jacquelynn Cree RN, BSN, CCM

## 2011-07-16 LAB — CULTURE, BLOOD (ROUTINE X 2)
Culture  Setup Time: 201304030036
Culture: NO GROWTH

## 2013-03-17 IMAGING — US US PARACENTESIS
1 series · 6 of 6 positions shown · non-contrast
Comparison: Previous paracentesis

CLINICAL DATA: Abdominal ascites; cirrhosis

ULTRASOUND GUIDED PARACENTESIS

[Series 1: us paracentesis · 0.30mm/px · 6 of 6 slices shown]
[im 1/6]
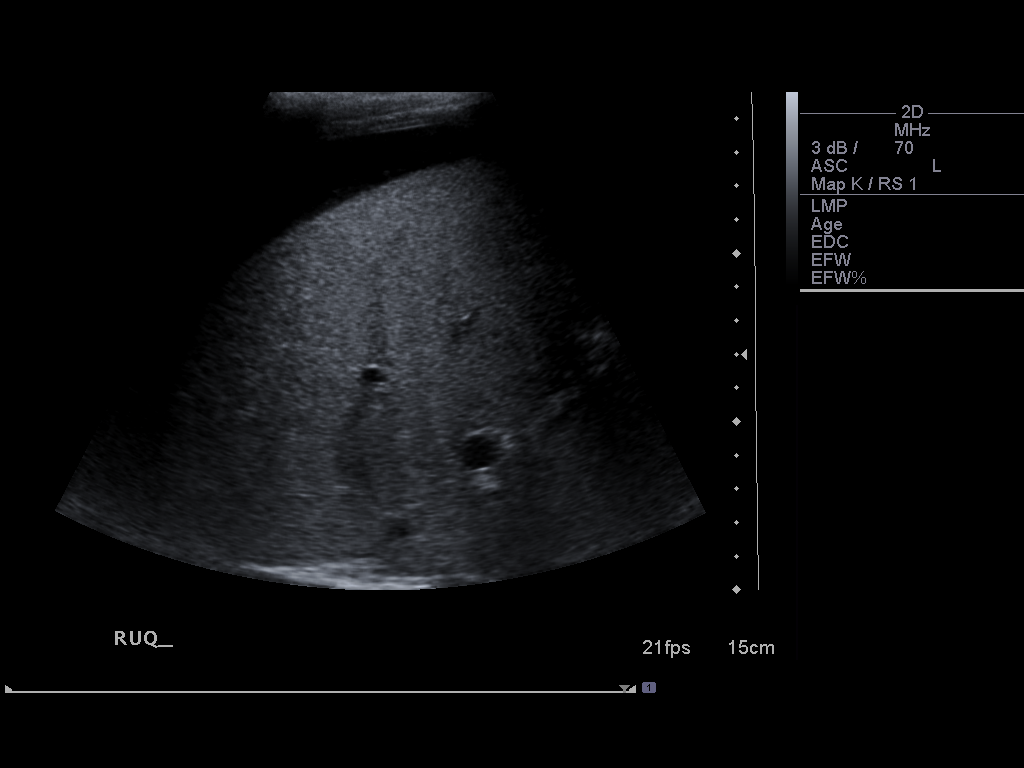
[im 2/6]
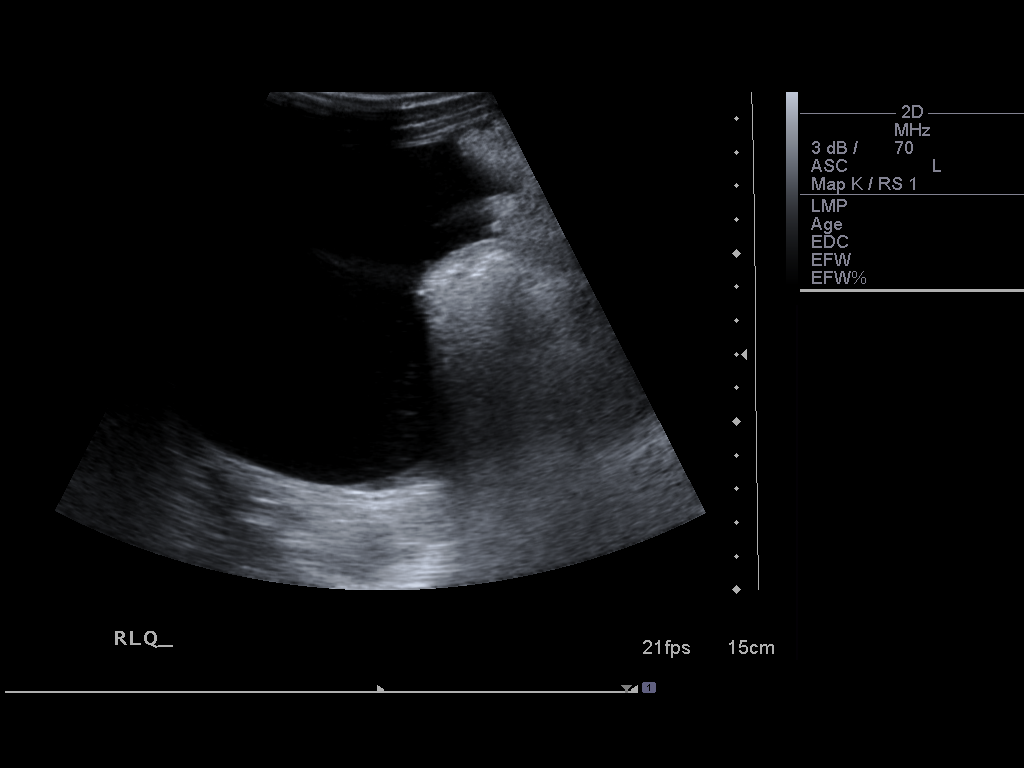
[im 3/6]
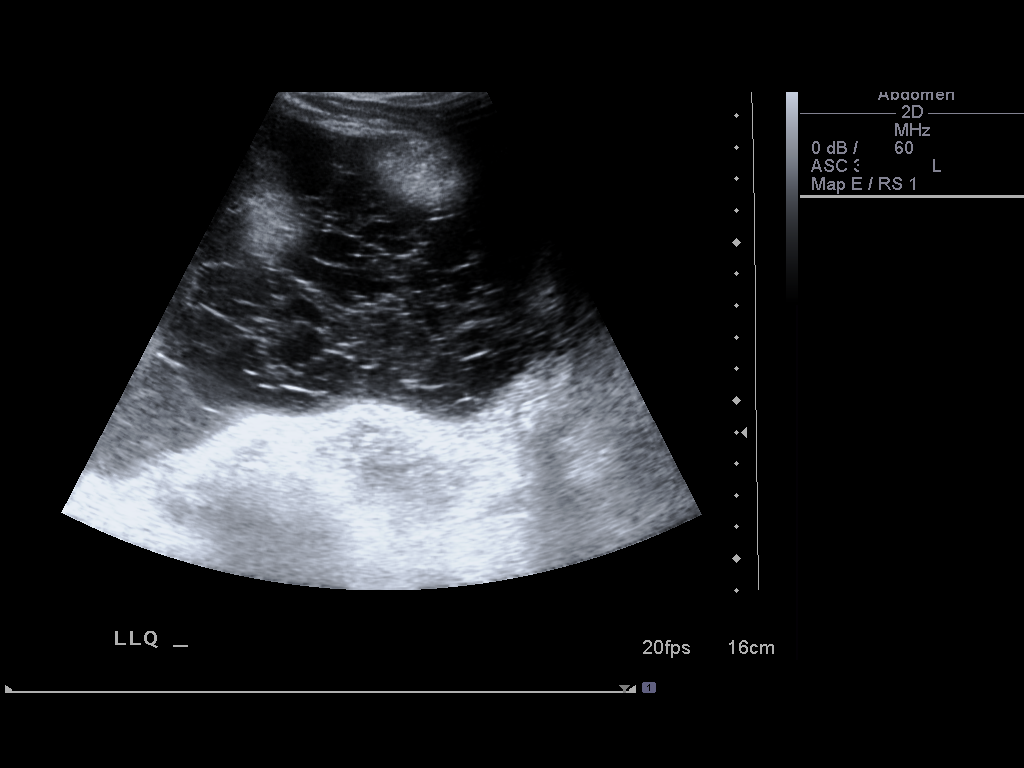
[im 4/6]
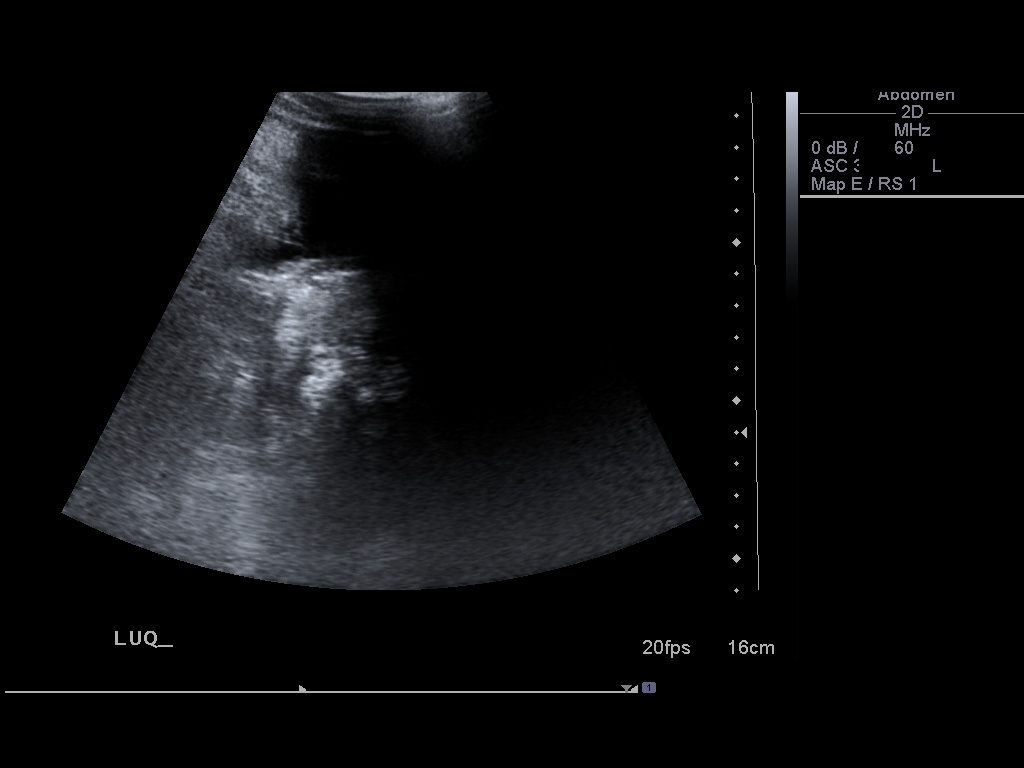
[im 5/6]
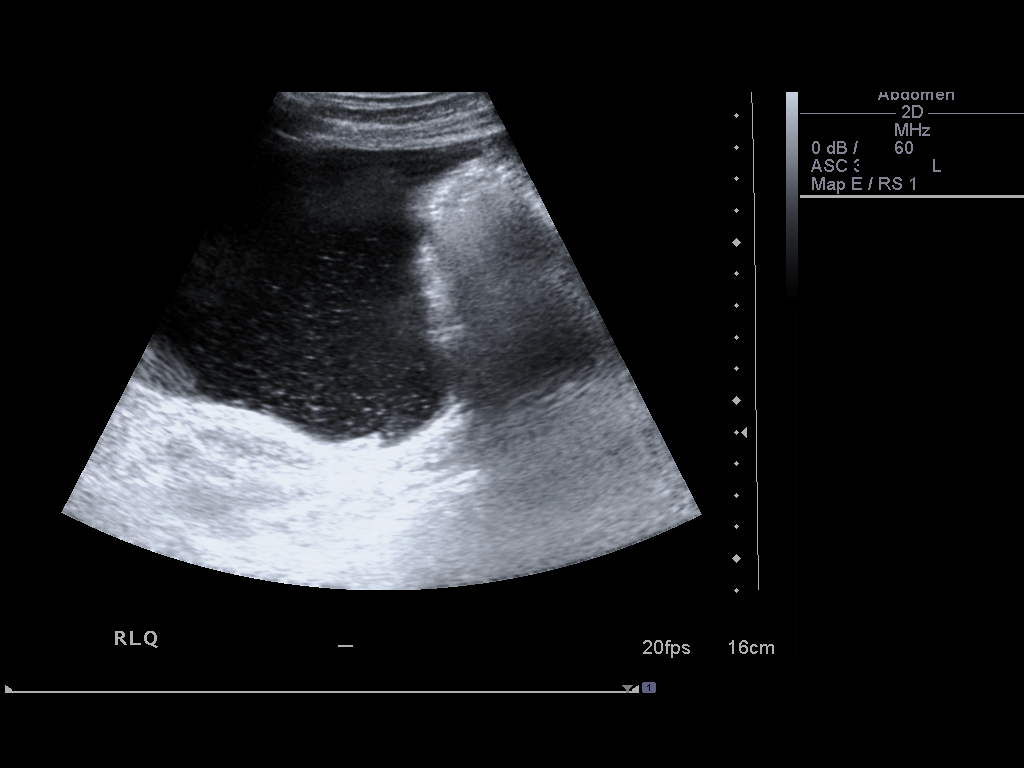
[im 6/6]
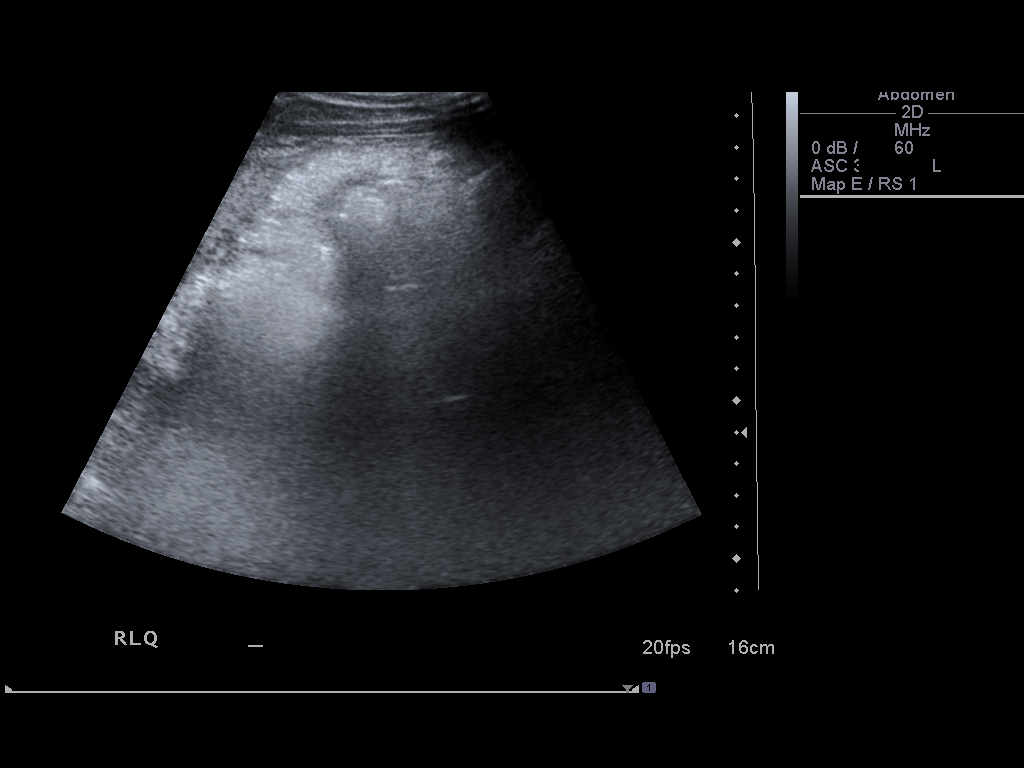

[6 of 6 positions shown; findings below may reference images not displayed]

An ultrasound guided paracentesis was thoroughly discussed with the
patient and questions answered.  The benefits, risks, alternatives
and complications were also discussed.  The patient understands and
wishes to proceed with the procedure.  Written consent was
obtained.

Ultrasound was performed to localize and mark an adequate pocket of
fluid in the right lower quadrant of the abdomen.  The area was
then prepped and draped in the normal sterile fashion.  1%
Lidocaine was used for local anesthesia.  Under ultrasound guidance
a 19 gauge Yueh catheter was introduced.  Paracentesis was
performed.  The catheter was removed and a dressing applied.

Complications:  None
FINDINGS: A total of approximately 1.9 liters of dark brown fluid
was removed.  A fluid sample was sent for laboratory analysis.
IMPRESSION: Successful ultrasound guided paracentesis yielding 1.9 liters of
ascites.

Read by: Haglund, Jerico.-ALOISIUS

## 2016-12-15 DIAGNOSIS — B182 Chronic viral hepatitis C: Secondary | ICD-10-CM | POA: Insufficient documentation

## 2017-03-13 DIAGNOSIS — B169 Acute hepatitis B without delta-agent and without hepatic coma: Secondary | ICD-10-CM | POA: Insufficient documentation

## 2019-12-03 DIAGNOSIS — I517 Cardiomegaly: Secondary | ICD-10-CM

## 2020-02-16 DIAGNOSIS — Z5900 Homelessness unspecified: Secondary | ICD-10-CM | POA: Insufficient documentation

## 2020-09-13 ENCOUNTER — Inpatient Hospital Stay (HOSPITAL_COMMUNITY)
Admission: AD | Admit: 2020-09-13 | Discharge: 2020-09-17 | DRG: 885 | Disposition: A | Discharge status: Home / Self Care | Payer: No Typology Code available for payment source | Source: Intra-hospital | Attending: Emergency Medicine | Admitting: Emergency Medicine

## 2020-09-13 DIAGNOSIS — G4700 Insomnia, unspecified: Secondary | ICD-10-CM | POA: Diagnosis present

## 2020-09-13 DIAGNOSIS — E119 Type 2 diabetes mellitus without complications: Secondary | ICD-10-CM | POA: Diagnosis present

## 2020-09-13 DIAGNOSIS — R64 Cachexia: Secondary | ICD-10-CM | POA: Diagnosis present

## 2020-09-13 DIAGNOSIS — F419 Anxiety disorder, unspecified: Secondary | ICD-10-CM | POA: Diagnosis present

## 2020-09-13 DIAGNOSIS — M25559 Pain in unspecified hip: Secondary | ICD-10-CM | POA: Diagnosis present

## 2020-09-13 DIAGNOSIS — F332 Major depressive disorder, recurrent severe without psychotic features: Principal | ICD-10-CM | POA: Diagnosis present

## 2020-09-13 DIAGNOSIS — F17210 Nicotine dependence, cigarettes, uncomplicated: Secondary | ICD-10-CM | POA: Diagnosis present

## 2020-09-13 DIAGNOSIS — Z9151 Personal history of suicidal behavior: Secondary | ICD-10-CM

## 2020-09-13 DIAGNOSIS — R45851 Suicidal ideations: Secondary | ICD-10-CM | POA: Diagnosis present

## 2020-09-13 DIAGNOSIS — R000 Tachycardia, unspecified: Secondary | ICD-10-CM | POA: Diagnosis present

## 2020-09-13 DIAGNOSIS — G8929 Other chronic pain: Secondary | ICD-10-CM | POA: Diagnosis present

## 2020-09-13 DIAGNOSIS — Z86718 Personal history of other venous thrombosis and embolism: Secondary | ICD-10-CM | POA: Diagnosis not present

## 2020-09-13 DIAGNOSIS — K7460 Unspecified cirrhosis of liver: Secondary | ICD-10-CM | POA: Diagnosis present

## 2020-09-13 DIAGNOSIS — Z86711 Personal history of pulmonary embolism: Secondary | ICD-10-CM

## 2020-09-13 DIAGNOSIS — F331 Major depressive disorder, recurrent, moderate: Secondary | ICD-10-CM | POA: Diagnosis present

## 2020-09-13 DIAGNOSIS — D649 Anemia, unspecified: Secondary | ICD-10-CM | POA: Diagnosis not present

## 2020-09-13 DIAGNOSIS — F1020 Alcohol dependence, uncomplicated: Secondary | ICD-10-CM | POA: Diagnosis not present

## 2020-09-13 DIAGNOSIS — D689 Coagulation defect, unspecified: Secondary | ICD-10-CM | POA: Diagnosis present

## 2020-09-13 LAB — GLUCOSE, CAPILLARY: Glucose-Capillary: 187 mg/dL — ABNORMAL HIGH (ref 70–99)

## 2020-09-13 MED ORDER — GABAPENTIN 400 MG PO CAPS
400.0000 mg | ORAL_CAPSULE | Freq: Three times a day (TID) | ORAL | Status: DC
  Filled 2020-09-13: qty 1

## 2020-09-13 MED ORDER — HYDROXYZINE HCL 25 MG PO TABS
25.0000 mg | ORAL_TABLET | Freq: Three times a day (TID) | ORAL | Status: DC | PRN
  Filled 2020-09-13: qty 1

## 2020-09-13 MED ORDER — SULFAMETHOXAZOLE-TRIMETHOPRIM 800-160 MG PO TABS
1.0000 | ORAL_TABLET | Freq: Two times a day (BID) | ORAL | Status: DC
  Administered 2020-09-13 – 2020-09-17 (×8): 1 via ORAL
  Filled 2020-09-13 (×14): qty 1

## 2020-09-13 MED ORDER — INSULIN ASPART 100 UNIT/ML IJ SOLN
0.0000 [IU] | Freq: Three times a day (TID) | INTRAMUSCULAR | Status: DC
  Administered 2020-09-14: 6 [IU] via SUBCUTANEOUS
  Administered 2020-09-14: 2 [IU] via SUBCUTANEOUS

## 2020-09-13 MED ORDER — TRAZODONE HCL 50 MG PO TABS
50.0000 mg | ORAL_TABLET | Freq: Every evening | ORAL | Status: DC | PRN
Start: 2020-09-13 — End: 2020-09-17
  Filled 2020-09-13: qty 1

## 2020-09-13 MED ORDER — METFORMIN HCL ER 500 MG PO TB24
500.0000 mg | ORAL_TABLET | Freq: Two times a day (BID) | ORAL | Status: DC
  Administered 2020-09-14: 500 mg via ORAL
  Filled 2020-09-13 (×5): qty 1

## 2020-09-13 MED ORDER — ACETAMINOPHEN 325 MG PO TABS: 650.0000 mg | ORAL_TABLET | Freq: Four times a day (QID) | ORAL | Status: DC | PRN

## 2020-09-13 MED ORDER — SERTRALINE HCL 50 MG PO TABS
50.0000 mg | ORAL_TABLET | Freq: Every day | ORAL | Status: DC
  Administered 2020-09-14: 50 mg via ORAL
  Filled 2020-09-13 (×3): qty 1

## 2020-09-13 MED ORDER — PRENATAL PLUS 27-1 MG PO TABS
1.0000 | ORAL_TABLET | Freq: Every day | ORAL | Status: DC
  Administered 2020-09-14 – 2020-09-17 (×4): 1 via ORAL
  Filled 2020-09-13 (×7): qty 1

## 2020-09-13 MED ORDER — GABAPENTIN 400 MG PO CAPS
400.0000 mg | ORAL_CAPSULE | Freq: Three times a day (TID) | ORAL | Status: DC
  Administered 2020-09-13 – 2020-09-17 (×11): 400 mg via ORAL
  Filled 2020-09-13 (×18): qty 1

## 2020-09-13 MED ORDER — BACITRACIN-NEOMYCIN-POLYMYXIN 400-5-5000 EX OINT
1.0000 "application " | TOPICAL_OINTMENT | Freq: Two times a day (BID) | CUTANEOUS | Status: DC
  Administered 2020-09-14 – 2020-09-17 (×7): 1 via TOPICAL
  Filled 2020-09-13 (×2): qty 1
  Filled 2020-09-13: qty 2
  Filled 2020-09-13 (×2): qty 1
  Filled 2020-09-13 (×2): qty 2

## 2020-09-13 MED ORDER — INSULIN GLARGINE 100 UNIT/ML ~~LOC~~ SOLN
35.0000 [IU] | Freq: Every day | SUBCUTANEOUS | Status: DC
  Administered 2020-09-13 – 2020-09-14 (×2): 35 [IU] via SUBCUTANEOUS

## 2020-09-13 MED ORDER — ALUM & MAG HYDROXIDE-SIMETH 200-200-20 MG/5ML PO SUSP: 30.0000 mL | ORAL | Status: DC | PRN

## 2020-09-13 MED ORDER — MAGNESIUM HYDROXIDE 400 MG/5ML PO SUSP: 30.0000 mL | Freq: Every day | ORAL | Status: DC | PRN

## 2020-09-13 MED ORDER — IBUPROFEN 800 MG PO TABS
800.0000 mg | ORAL_TABLET | Freq: Four times a day (QID) | ORAL | Status: DC | PRN
  Administered 2020-09-13 – 2020-09-17 (×8): 800 mg via ORAL
  Filled 2020-09-13 (×8): qty 1
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
## 2020-09-14 ENCOUNTER — Other Ambulatory Visit: Payer: Self-pay

## 2020-09-14 ENCOUNTER — Encounter (HOSPITAL_COMMUNITY): Payer: Self-pay | Admitting: Nurse Practitioner

## 2020-09-14 DIAGNOSIS — D689 Coagulation defect, unspecified: Secondary | ICD-10-CM

## 2020-09-14 DIAGNOSIS — R64 Cachexia: Secondary | ICD-10-CM

## 2020-09-14 DIAGNOSIS — K7460 Unspecified cirrhosis of liver: Secondary | ICD-10-CM

## 2020-09-14 DIAGNOSIS — F332 Major depressive disorder, recurrent severe without psychotic features: Principal | ICD-10-CM

## 2020-09-14 DIAGNOSIS — F1020 Alcohol dependence, uncomplicated: Secondary | ICD-10-CM

## 2020-09-14 DIAGNOSIS — D649 Anemia, unspecified: Secondary | ICD-10-CM

## 2020-09-14 DIAGNOSIS — R000 Tachycardia, unspecified: Secondary | ICD-10-CM

## 2020-09-14 LAB — GLUCOSE, CAPILLARY
Glucose-Capillary: 248 mg/dL — ABNORMAL HIGH (ref 70–99)
Glucose-Capillary: 490 mg/dL — ABNORMAL HIGH (ref 70–99)
Glucose-Capillary: 410 mg/dL — ABNORMAL HIGH (ref 70–99)
Glucose-Capillary: 247 mg/dL — ABNORMAL HIGH (ref 70–99)
Glucose-Capillary: 211 mg/dL — ABNORMAL HIGH (ref 70–99)
Glucose-Capillary: 236 mg/dL — ABNORMAL HIGH (ref 70–99)

## 2020-09-14 MED ORDER — INSULIN ASPART 100 UNIT/ML IJ SOLN
4.0000 [IU] | Freq: Three times a day (TID) | INTRAMUSCULAR | Status: DC
  Administered 2020-09-15 – 2020-09-17 (×6): 4 [IU] via SUBCUTANEOUS

## 2020-09-14 MED ORDER — GLUCERNA SHAKE PO LIQD
237.0000 mL | Freq: Three times a day (TID) | ORAL | Status: DC
  Administered 2020-09-14 – 2020-09-16 (×6): 237 mL via ORAL
  Filled 2020-09-14 (×19): qty 237

## 2020-09-14 MED ORDER — INSULIN ASPART 100 UNIT/ML IJ SOLN
0.0000 [IU] | Freq: Three times a day (TID) | INTRAMUSCULAR | Status: DC
  Administered 2020-09-14 – 2020-09-15 (×3): 2 [IU] via SUBCUTANEOUS
  Administered 2020-09-15: 3 [IU] via SUBCUTANEOUS
  Administered 2020-09-15: 1 [IU] via SUBCUTANEOUS
  Administered 2020-09-15 – 2020-09-16 (×4): 2 [IU] via SUBCUTANEOUS
  Administered 2020-09-17: 3 [IU] via SUBCUTANEOUS
  Administered 2020-09-17: 1 [IU] via SUBCUTANEOUS

## 2020-09-14 MED ORDER — INSULIN ASPART 100 UNIT/ML IJ SOLN
2.0000 [IU] | Freq: Once | INTRAMUSCULAR | Status: AC
  Administered 2020-09-14: 2 [IU] via SUBCUTANEOUS

## 2020-09-14 MED ORDER — INSULIN ASPART 100 UNIT/ML IJ SOLN
6.0000 [IU] | Freq: Once | INTRAMUSCULAR | Status: AC
  Administered 2020-09-14: 6 [IU] via SUBCUTANEOUS

## 2020-09-14 MED ORDER — SERTRALINE HCL 100 MG PO TABS
100.0000 mg | ORAL_TABLET | Freq: Every day | ORAL | Status: DC
  Administered 2020-09-15 – 2020-09-17 (×3): 100 mg via ORAL
  Filled 2020-09-14 (×6): qty 1

## 2020-09-14 MED ORDER — METFORMIN HCL ER 750 MG PO TB24
750.0000 mg | ORAL_TABLET | Freq: Two times a day (BID) | ORAL | Status: DC
  Administered 2020-09-14 – 2020-09-17 (×6): 750 mg via ORAL
  Filled 2020-09-14 (×10): qty 1
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
## 2020-09-14 NOTE — [facility] Counselor (Signed)
[facility] LCSW Note  09/14/2020   1:07 PM  Type of Contact and Topic:  PSA Attempt  CSW attempted to meet with pt to complete PSA. Pt declined to meet, requesting to complete at a later time due to sleeping.   CSW team will continue efforts to complete PSA w/ pt at a later time.    Leisa Lenz, LCSW 09/14/2020  1:07 PM

## 2020-09-14 NOTE — Tx Team (Signed)
Initial Treatment Plan 09/14/2020 5:40 AM [name] FWY:[id number]    PATIENT STRESSORS: Financial difficulties Marital or family conflict   PATIENT STRENGTHS: Ability for insight Motivation for treatment/growth   PATIENT IDENTIFIED PROBLEMS: Suicidal Ideation  (Myself) patient did not identify another goal at this time                   DISCHARGE CRITERIA:  Adequate post-discharge living arrangements Improved stabilization in mood, thinking, and/or behavior Verbal commitment to aftercare and medication compliance  PRELIMINARY DISCHARGE PLAN: Outpatient therapy Placement in alternative living arrangements  PATIENT/FAMILY INVOLVEMENT: This treatment plan has been presented to and reviewed with the patient, Antonio [name], and/or family member.  The patient and family have been given the opportunity to ask questions and make suggestions.  Mancel Bale, RN 09/14/2020, 5:40 AM

## 2020-09-14 NOTE — Progress Notes (Signed)
Adult Psychoeducational Group Note  Date:  09/14/2020 Time:  10:40 AM  Group Topic/Focus:  Goals Group:   The focus of this group is to help patients establish daily goals to achieve during treatment and discuss how the patient can incorporate goal setting into their daily lives to aide in recovery.  Participation Level:  Did Not Attend  Participation Hinda Kehr [facility] 09/14/2020, 10:40 AM

## 2020-09-14 NOTE — Progress Notes (Signed)
NUTRITION ASSESSMENT  Pt identified as at risk on the Malnutrition Screen Tool  INTERVENTION: 1. Supplements: Glucerna Shake po TID, each supplement provides 220 kcal and 10 grams of protein   NUTRITION DIAGNOSIS: Unintentional weight loss related to sub-optimal intake as evidenced by pt report.   Goal: Pt to meet >/= 90% of their estimated nutrition needs.  Monitor:  PO intake  Assessment:  Pt admitted for depression and SI.  Pt with history of alcohol abuse and cirrhosis. CBGs currently running in the 400s. No history of diabetes noted in chart. BMI is currently in the underweight category. Noted pt is receiving prenatal vitamin in medications. Will place order for Glucerna shakes TID.   Height: Ht Readings from Last 1 Encounters:  09/13/20 6\' 2"  (1.88 m)    Weight: Wt Readings from Last 1 Encounters:  09/13/20 54 kg    Weight Hx: Wt Readings from Last 10 Encounters:  09/13/20 54 kg  07/09/11 53.6 kg  06/28/11 64.7 kg    BMI:  Body mass index is 15.28 kg/m. Pt meets criteria for underweight based on current BMI.  Estimated Nutritional Needs: Kcal: 25-30 kcal/kg Protein: > 1 gram protein/kg Fluid: 1 ml/kcal  Diet Order:  Diet Order             Diet Carb Modified Fluid consistency: Thin; Room service appropriate? Yes  Diet effective now                  Pt is also offered choice of unit snacks mid-morning and mid-afternoon.  Pt is eating as desired.   Lab results and medications reviewed.   06/30/11, MS, RD, LDN Inpatient Clinical Dietitian Contact information available via Amion

## 2020-09-14 NOTE — Progress Notes (Signed)
Antonio [name] is a 50 year old male who presented voluntarily to [facility] ED on 09/12/2020 due to right hip pain. He subsequently reported SI with a plan to slit his wrists. He was recommended for inpatient psychiatric treatment.   Upon entering patient's room, he appeared to be sleeping. Easily aroused. He immediatly reported right hip pain and requested pain medication. Patient refused to participate in assessment.   Chart reviewed. Right hip x-ray at [facility] was negative. Patient was started on bactrim BID for possible wound infections while at [facility]. He received two doses at [facility]. Will continue Bactrim DS 800-160 mg BID for 6 days.   Patient was medically admitted at [facility] 08/15/20-08/17/20 for uncontrolled Type 2 diabetes with hyperglycemia and suicidal thoughts. He was discharged on lantus 35 units every evening, gabapentin 300 mg TID, metformin XR 500 mg BID, MVI, and sertraline 50 mg daily.   Plan to continue lantus 35 units subcutaneously QHS, metformin XR 500 mg BID, gabapentin 400 mg TID, MVI, and sertraline 50 mg daily.   Placed orders for CBC, CMP, TSH, Lipid Panel, ammonia level, and Hgb A1C  RIGHT HIP (WITH PELVIS) 2-3 VIEWS at [facility] ED.   FINDINGS: No evidence of hip fracture or dislocation. No pelvic fracture identified. No evidence of arthropathy or other focal bone abnormality.

## 2020-09-14 NOTE — Progress Notes (Signed)
Psychoeducational Group Note  Date:  09/14/2020 Time:  2015  Group Topic/Focus:  Wrap up group  Participation Level: Did Not Attend  Participation Quality:  Not Applicable  Affect:  Not Applicable  Cognitive:  Not Applicable  Insight:  Not Applicable  Engagement in Group: Not Applicable  Additional Comments: Did not attend.   Marcille Buffy 09/14/2020, 8:50 PM

## 2020-09-14 NOTE — Progress Notes (Signed)
   09/14/20 1300  Psych Admission Type (Psych Patients Only)  Admission Status Voluntary  Psychosocial Assessment  Patient Complaints Anxiety;Depression  Eye Contact Brief;Avoids  Facial Expression Angry  Affect Depressed  Speech Logical/coherent  Interaction Assertive;Guarded;Minimal  Motor Activity Unsteady  Appearance/Hygiene In scrubs;Disheveled  Behavior Characteristics Unable to participate  Mood Sad  Thought Process  Coherency WDL  Content Blaming others  Delusions None reported or observed  Perception WDL  Hallucination None reported or observed  Judgment Poor  Confusion None  Danger to Self  Current suicidal ideation? Denies  Self-Injurious Behavior No self-injurious ideation or behavior indicators observed or expressed   Agreement Not to Harm Self Yes  Description of Agreement verbal contract  Danger to Others  Danger to Others None reported or observed

## 2020-09-14 NOTE — Progress Notes (Signed)
   09/14/20 2137  Psych Admission Type (Psych Patients Only)  Admission Status Voluntary  Psychosocial Assessment  Patient Complaints Anxiety;Depression  Eye Contact Brief  Facial Expression Pensive;Pained  Affect Depressed;Sad  Speech Logical/coherent  Interaction Assertive;Minimal  Motor Activity Unsteady  Appearance/Hygiene Disheveled;In scrubs  Behavior Characteristics Appropriate to situation  Mood Depressed;Sad  Thought Process  Coherency WDL  Content Blaming others  Delusions None reported or observed  Perception WDL  Hallucination None reported or observed  Judgment Poor  Confusion None  Danger to Self  Current suicidal ideation? Denies  Self-Injurious Behavior No self-injurious ideation or behavior indicators observed or expressed   Agreement Not to Harm Self Yes  Description of Agreement verbal contract  Danger to Others  Danger to Others None reported or observed
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
## 2020-09-14 NOTE — Progress Notes (Signed)
Adult Psychoeducational Group Note  Date:  09/14/2020 Time:  4:38 PM  Group Topic/Focus:  Making Healthy Choices:   The focus of this group is to help patients identify negative/unhealthy choices they were using prior to admission and identify positive/healthier coping strategies to replace them upon discharge.  Participation Level:  Did Not Attend  Margaret Pyle 09/14/2020, 4:38 PM

## 2020-09-15 DIAGNOSIS — M25551 Pain in right hip: Secondary | ICD-10-CM | POA: Diagnosis present

## 2020-09-15 LAB — CBC
HCT: 38.4 % — ABNORMAL LOW (ref 39.0–52.0)
Hemoglobin: 12.9 g/dL — ABNORMAL LOW (ref 13.0–17.0)
MCH: 32.3 pg (ref 26.0–34.0)
MCHC: 33.6 g/dL (ref 30.0–36.0)
MCV: 96 fL (ref 80.0–100.0)
Platelets: 125 10*3/uL — ABNORMAL LOW (ref 150–400)
RBC: 4 MIL/uL — ABNORMAL LOW (ref 4.22–5.81)
RDW: 12.8 % (ref 11.5–15.5)
WBC: 5.9 10*3/uL (ref 4.0–10.5)
nRBC: 0 % (ref 0.0–0.2)

## 2020-09-15 LAB — RAPID URINE DRUG SCREEN, HOSP PERFORMED
Amphetamines: POSITIVE — AB
Barbiturates: NOT DETECTED
Benzodiazepines: NOT DETECTED
Cocaine: NOT DETECTED
Opiates: NOT DETECTED
Tetrahydrocannabinol: NOT DETECTED

## 2020-09-15 LAB — COMPREHENSIVE METABOLIC PANEL
ALT: 104 U/L — ABNORMAL HIGH (ref 0–44)
AST: 179 U/L — ABNORMAL HIGH (ref 15–41)
Albumin: 2.9 g/dL — ABNORMAL LOW (ref 3.5–5.0)
Alkaline Phosphatase: 191 U/L — ABNORMAL HIGH (ref 38–126)
Anion gap: 10 (ref 5–15)
BUN: 12 mg/dL (ref 6–20)
CO2: 25 mmol/L (ref 22–32)
Calcium: 8.6 mg/dL — ABNORMAL LOW (ref 8.9–10.3)
Chloride: 97 mmol/L — ABNORMAL LOW (ref 98–111)
Creatinine, Ser: 0.62 mg/dL (ref 0.61–1.24)
GFR, Estimated: >60 mL/min (ref >60)
Glucose, Bld: 263 mg/dL — ABNORMAL HIGH (ref 70–99)
Potassium: 4.3 mmol/L (ref 3.5–5.1)
Sodium: 132 mmol/L — ABNORMAL LOW (ref 135–145)
Total Bilirubin: 0.8 mg/dL (ref 0.3–1.2)
Total Protein: 6.6 g/dL (ref 6.5–8.1)

## 2020-09-15 LAB — GLUCOSE, CAPILLARY
Glucose-Capillary: 212 mg/dL — ABNORMAL HIGH (ref 70–99)
Glucose-Capillary: 154 mg/dL — ABNORMAL HIGH (ref 70–99)
Glucose-Capillary: 277 mg/dL — ABNORMAL HIGH (ref 70–99)
Glucose-Capillary: 249 mg/dL — ABNORMAL HIGH (ref 70–99)

## 2020-09-15 LAB — TSH: TSH: 0.882 u[IU]/mL (ref 0.350–4.500)

## 2020-09-15 LAB — LIPID PANEL
Cholesterol: 130 mg/dL (ref 0–200)
HDL: 28 mg/dL — ABNORMAL LOW (ref >40)
LDL Cholesterol: 82 mg/dL (ref 0–99)
Total CHOL/HDL Ratio: 4.6 RATIO
Triglycerides: 101 mg/dL (ref <150)
VLDL: 20 mg/dL (ref 0–40)

## 2020-09-15 LAB — AMMONIA: Ammonia: 41 umol/L — ABNORMAL HIGH (ref 9–35)

## 2020-09-15 MED ORDER — INSULIN GLARGINE 100 UNIT/ML ~~LOC~~ SOLN
38.0000 [IU] | Freq: Every day | SUBCUTANEOUS | Status: DC
  Administered 2020-09-15 – 2020-09-16 (×2): 38 [IU] via SUBCUTANEOUS

## 2020-09-15 NOTE — Progress Notes (Signed)
Inpatient Diabetes Program Recommendations  AACE/ADA: New Consensus Statement on Inpatient Glycemic Control (2015)  Target Ranges:  Prepandial:   less than 140 mg/dL      Peak postprandial:   less than 180 mg/dL (1-2 hours)      Critically ill patients:  140 - 180 mg/dL   Lab Results  Component Value Date   GLUCAP 212 (H) 09/15/2020    Review of Glycemic Control Results for Antonio, [name] (MRN [id number]) as of 09/15/2020 08:57  Ref. Range 09/14/2020 17:20 09/14/2020 21:06 09/15/2020 05:57  Glucose-Capillary Latest Ref Range: 70 - 99 mg/dL 045 (H) 997 (H) 741 (H)   Diabetes history: Type 2 DM Outpatient Diabetes medications: Metformin 500 mg BID, Lantus 35 units QHS Current orders for Inpatient glycemic control: Lantus 35 units QHS, Metformin 750 mg BID, Novolog 4 units TID, Novolog 0-6 units TID & HS  Inpatient Diabetes Program Recommendations:    Consider increasing Lantus to 38 units QHS.   Thanks, Lujean Rave, MSN, RNC-OB Diabetes Coordinator [phone] (8a-5p)

## 2020-09-15 NOTE — Tx Team (Signed)
Interdisciplinary Treatment and Diagnostic Plan Update  09/15/2020 Time of Session:  Antonio [name] MRN: [id number]  Principal Diagnosis: MDD (major depressive disorder), recurrent episode, severe ([place])  Secondary Diagnoses: Principal Problem:   MDD (major depressive disorder), recurrent episode, severe ([place]) Active Problems:   Tachycardia   Cirrhosis ([place])   Coagulopathy (HCC)   Cachexia ([place])   Anemia   Alcoholism ([place])   Hip pain, chronic, right   Current Medications:  Current Facility-Administered Medications  Medication Dose Route Frequency Provider Last Rate Last Admin   acetaminophen (TYLENOL) tablet 650 mg  650 mg Oral Q6H PRN Rozetta Nunnery, NP       alum & mag hydroxide-simeth (MAALOX/MYLANTA) 200-200-20 MG/5ML suspension 30 mL  30 mL Oral Q4H PRN Lindon Romp A, NP       feeding supplement (GLUCERNA SHAKE) (GLUCERNA SHAKE) liquid 237 mL  237 mL Oral TID BM Nelda Marseille, Amy E, MD   237 mL at 09/14/20 1536   gabapentin (NEURONTIN) capsule 400 mg  400 mg Oral TID Lindon Romp A, NP   400 mg at 09/15/20 0901   hydrOXYzine (ATARAX/VISTARIL) tablet 25 mg  25 mg Oral TID PRN Rozetta Nunnery, NP       ibuprofen (ADVIL) tablet 800 mg  800 mg Oral Q6H PRN Lindon Romp A, NP   800 mg at 09/15/20 0615   insulin aspart (novoLOG) injection 0-6 Units  0-6 Units Subcutaneous TID AC & HS Lavella Hammock, MD   2 Units at 09/15/20 0616   insulin aspart (novoLOG) injection 4 Units  4 Units Subcutaneous TID WC Harlow Asa, MD   4 Units at 09/15/20 0924   insulin glargine (LANTUS) injection 35 Units  35 Units Subcutaneous QHS Lindon Romp A, NP   35 Units at 09/14/20 2138   magnesium hydroxide (MILK OF MAGNESIA) suspension 30 mL  30 mL Oral Daily PRN Rozetta Nunnery, NP       metFORMIN (GLUCOPHAGE-XR) 24 hr tablet 750 mg  750 mg Oral BID WC Lavella Hammock, MD   750 mg at 09/15/20 0900   neomycin-bacitracin-polymyxin (NEOSPORIN) ointment packet 1 application  1 application Topical BID Lindon Romp A, NP   1 application at 97/94/80 2135   prenatal vitamin w/FE, FA (PRENATAL 1 + 1) 27-1 MG tablet 1 tablet  1 tablet Oral Daily Lindon Romp A, NP   1 tablet at 09/15/20 0901   sertraline (ZOLOFT) tablet 100 mg  100 mg Oral Daily Lavella Hammock, MD   100 mg at 09/15/20 0901   sulfamethoxazole-trimethoprim (BACTRIM DS) 800-160 MG per tablet 1 tablet  1 tablet Oral Q12H Lindon Romp A, NP   1 tablet at 09/15/20 0901   traZODone (DESYREL) tablet 50 mg  50 mg Oral QHS PRN Rozetta Nunnery, NP       PTA Medications: Medications Prior to Admission  Medication Sig Dispense Refill Last Dose   famotidine (PEPCID) 20 MG tablet Take 20 mg by mouth 2 (two) times daily. (Patient not taking: Reported on 09/14/2020)   Not Taking   furosemide (LASIX) 40 MG tablet Take 40 mg by mouth daily. (Patient not taking: Reported on 09/14/2020)   Not Taking   gabapentin (NEURONTIN) 400 MG capsule Take 400 mg by mouth 3 (three) times daily. (Patient not taking: Reported on 09/14/2020)   Not Taking   insulin glargine (LANTUS) 100 UNIT/ML injection Inject 35 Units into the skin at bedtime. (Patient not taking: Reported on 09/14/2020)  Not Taking   lactulose (CHRONULAC) 10 GM/15ML solution Take 20 g by mouth daily. (Patient not taking: Reported on 09/14/2020)   Not Taking   metFORMIN (GLUCOPHAGE) 1000 MG tablet Take 1 tablet by mouth 2 (two) times daily. (Patient not taking: Reported on 09/14/2020)   Not Taking   metFORMIN (GLUCOPHAGE-XR) 500 MG 24 hr tablet Take by mouth. (Patient not taking: Reported on 09/14/2020)   Not Taking   omeprazole (PRILOSEC) 20 MG capsule Take 20 mg by mouth 2 (two) times daily. (Patient not taking: Reported on 09/14/2020)   Not Taking   prenatal vitamin w/FE, FA (PRENATAL 1 + 1) 27-1 MG TABS Take 1 tablet by mouth daily. (Patient not taking: Reported on 09/14/2020)   Not Taking   promethazine (PHENERGAN) 12.5 MG tablet Take 12.5 mg by mouth every 6 (six) hours as needed. For nausea and vomiting (Patient not  taking: Reported on 09/14/2020)   Not Taking   Rivaroxaban (XARELTO) 15 MG TABS tablet Take 15 mg by mouth daily. (Patient not taking: Reported on 09/14/2020)   Not Taking   sertraline (ZOLOFT) 50 MG tablet Take 50 mg by mouth daily. (Patient not taking: Reported on 09/14/2020)   Not Taking   spironolactone (ALDACTONE) 100 MG tablet Take 1 tablet (100 mg total) by mouth daily. 30 tablet 0     Patient Stressors: Financial difficulties Marital or family conflict  Patient Strengths: Ability for insight Motivation for treatment/growth  Treatment Modalities: Medication Management, Group therapy, Case management,  1 to 1 session with clinician, Psychoeducation, Recreational therapy.   Physician Treatment Plan for Primary Diagnosis: MDD (major depressive disorder), recurrent episode, severe ([place]) Long Term Goal(s): Improvement in symptoms so as ready for discharge   Short Term Goals: Ability to identify changes in lifestyle to reduce recurrence of condition will improve Ability to verbalize feelings will improve Ability to disclose and discuss suicidal ideas Ability to demonstrate self-control will improve Ability to identify and develop effective coping behaviors will improve Ability to maintain clinical measurements within normal limits will improve Compliance with prescribed medications will improve Ability to identify triggers associated with substance abuse/mental health issues will improve  Medication Management: Evaluate patient's response, side effects, and tolerance of medication regimen.  Therapeutic Interventions: 1 to 1 sessions, Unit Group sessions and Medication administration.  Evaluation of Outcomes: Not Met  Physician Treatment Plan for Secondary Diagnosis: Principal Problem:   MDD (major depressive disorder), recurrent episode, severe (HCC) Active Problems:   Tachycardia   Cirrhosis (HCC)   Coagulopathy (HCC)   Cachexia (HCC)   Anemia   Alcoholism ([place])   Hip pain,  chronic, right  Long Term Goal(s): Improvement in symptoms so as ready for discharge   Short Term Goals: Ability to identify changes in lifestyle to reduce recurrence of condition will improve Ability to verbalize feelings will improve Ability to disclose and discuss suicidal ideas Ability to demonstrate self-control will improve Ability to identify and develop effective coping behaviors will improve Ability to maintain clinical measurements within normal limits will improve Compliance with prescribed medications will improve Ability to identify triggers associated with substance abuse/mental health issues will improve     Medication Management: Evaluate patient's response, side effects, and tolerance of medication regimen.  Therapeutic Interventions: 1 to 1 sessions, Unit Group sessions and Medication administration.  Evaluation of Outcomes: Not Met   RN Treatment Plan for Primary Diagnosis: MDD (major depressive disorder), recurrent episode, severe ([place]) Long Term Goal(s): Knowledge of disease and therapeutic regimen to maintain  health will improve  Short Term Goals: Ability to demonstrate self-control, Ability to participate in decision making will improve, and Ability to verbalize feelings will improve  Medication Management: RN will administer medications as ordered by provider, will assess and evaluate patient's response and provide education to patient for prescribed medication. RN will report any adverse and/or side effects to prescribing provider.  Therapeutic Interventions: 1 on 1 counseling sessions, Psychoeducation, Medication administration, Evaluate responses to treatment, Monitor vital signs and CBGs as ordered, Perform/monitor CIWA, COWS, AIMS and Fall Risk screenings as ordered, Perform wound care treatments as ordered.  Evaluation of Outcomes: Not Met   LCSW Treatment Plan for Primary Diagnosis: MDD (major depressive disorder), recurrent episode, severe ([place]) Long  Term Goal(s): Safe transition to appropriate next level of care at discharge, Engage patient in therapeutic group addressing interpersonal concerns.  Short Term Goals: Engage patient in aftercare planning with referrals and resources, Increase ability to appropriately verbalize feelings, and Increase emotional regulation  Therapeutic Interventions: Assess for all discharge needs, 1 to 1 time with Social worker, Explore available resources and support systems, Assess for adequacy in community support network, Educate family and significant other(s) on suicide prevention, Complete Psychosocial Assessment, Interpersonal group therapy.  Evaluation of Outcomes: Not Met   Progress in Treatment: Attending groups: Yes. Participating in groups: Yes. Taking medication as prescribed: Yes. Toleration medication: Yes. Family/Significant other contact made: Yes, individual(s) contacted:  pt declined consents Patient understands diagnosis: No. Discussing patient identified problems/goals with staff: Yes. Medical problems stabilized or resolved: Yes. Denies suicidal/homicidal ideation: Yes. Issues/concerns per patient self-inventory: Yes. Other: None  New problem(s) identified: No, Describe:  None  New Short Term/Long Term Goal(s):medication stabilization, elimination of SI thoughts, development of comprehensive mental wellness plan.   Patient Goals:  "to get better"  Discharge Plan or Barriers: Patient recently admitted. CSW will continue to follow and assess for appropriate referrals and possible discharge planning.   Reason for Continuation of Hospitalization: Depression Medication stabilization Suicidal ideation  Estimated Length of Stay: 3-5 days  Attendees: Patient: Antonio [name] 09/15/2020   Physician:  09/15/2020   Nursing:  09/15/2020   RN Care Manager: 09/15/2020   Social Worker: Toney Reil, [place] 09/15/2020   Recreational Therapist:  09/15/2020   Other:  09/15/2020   Other:   09/15/2020   Other: 09/15/2020     Scribe for Treatment Team: Mliss Fritz, Latanya Presser 09/15/2020 10:48 AM

## 2020-09-15 NOTE — [facility] Suicide Risk Assessment (Signed)
[facility] INPATIENT:  Family/Significant Other Suicide Prevention Education  Suicide Prevention Education:  Patient Refusal for Family/Significant Other Suicide Prevention Education: The patient Antonio [name] has refused to provide written consent for family/significant other to be provided Family/Significant Other Suicide Prevention Education during admission and/or prior to discharge.  Physician notified.  Felizardo Hoffmann 09/15/2020, 11:18 AM

## 2020-09-15 NOTE — Progress Notes (Signed)
Adult Psychoeducational Group Note  Date:  09/15/2020 Time:  10:33 AM  Group Topic/Focus:  Goals Group:   The focus of this group is to help patients establish daily goals to achieve during treatment and discuss how the patient can incorporate goal setting into their daily lives to aide in recovery.  Participation Level:  Active  Participation Quality:  Appropriate  Affect:  Appropriate  Cognitive:  Appropriate  Insight: Good  Engagement in Group:  Engaged  Modes of Intervention:  Discussion  Additional Comments:  Pt had a goal today of focusing one one thing at a time and not over think every situation.  Antonio [name] 09/15/2020, 10:33 AM

## 2020-09-15 NOTE — Progress Notes (Signed)
Recreation Therapy Notes  Date:  6.10.22 Time: 0930 Location: [address] Dayroom  Group Topic: Stress Management  Goal Area(s) Addresses:  Patient will identify positive stress management techniques. Patient will identify benefits of using stress management post d/c.  Intervention: Stress Management  Activity:  Meditation.  LRT played a meditation that focused on calming anxiety by allowing the breathing to relax you and also in the moment clear the mind of any worry or things out your control.    Education:  Stress Management, Discharge Planning.   Education Outcome: Acknowledges Education  Clinical Observations/Feedback: Pt did not attend group session.     Shuntavia Yerby, LRT/CTRS         Deanta Mincey A 09/15/2020 11:11 AM 
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
## 2020-09-15 NOTE — Progress Notes (Signed)
   09/15/20 1600  Psych Admission Type (Psych Patients Only)  Admission Status Voluntary  Psychosocial Assessment  Patient Complaints Anxiety;Depression  Eye Contact Brief  Facial Expression Pensive;Pained  Affect Depressed;Sad  Speech Logical/coherent  Interaction Assertive;Minimal  Motor Activity Unsteady  Appearance/Hygiene Disheveled;In scrubs  Behavior Characteristics Appropriate to situation  Mood Depressed;Sad  Thought Process  Coherency WDL  Content Blaming others  Delusions None reported or observed  Perception WDL  Hallucination None reported or observed  Judgment Poor  Confusion None  Danger to Self  Current suicidal ideation? Denies  Self-Injurious Behavior No self-injurious ideation or behavior indicators observed or expressed   Agreement Not to Harm Self Yes  Description of Agreement verbal contract  Danger to Others  Danger to Others None reported or observed

## 2020-09-15 NOTE — Progress Notes (Signed)
Adult Psychoeducational Group Note  Date:  09/15/2020 Time:  11:53 AM  Group Topic/Focus:  Managing Feelings:   The focus of this group is to identify what feelings patients have difficulty handling and develop a plan to handle them in a healthier way upon discharge.  Participation Level:  Did Not Attend  [employer] [facility] 09/15/2020, 11:53 AM
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
## 2020-09-16 LAB — HEMOGLOBIN A1C
Hgb A1c MFr Bld: 12.5 % — ABNORMAL HIGH (ref 4.8–5.6)
Mean Plasma Glucose: 312 mg/dL

## 2020-09-16 LAB — GLUCOSE, CAPILLARY
Glucose-Capillary: 124 mg/dL — ABNORMAL HIGH (ref 70–99)
Glucose-Capillary: 233 mg/dL — ABNORMAL HIGH (ref 70–99)
Glucose-Capillary: 220 mg/dL — ABNORMAL HIGH (ref 70–99)
Glucose-Capillary: 250 mg/dL — ABNORMAL HIGH (ref 70–99)

## 2020-09-16 NOTE — Progress Notes (Addendum)
   09/16/20 1500  Psych Admission Type (Psych Patients Only)  Admission Status Voluntary  Psychosocial Assessment  Patient Complaints Depression  Eye Contact Brief  Facial Expression Pensive;Pained  Affect Depressed;Sad  Speech Logical/coherent  Interaction Assertive;Minimal  Motor Activity Unsteady  Appearance/Hygiene Disheveled;In scrubs  Behavior Characteristics Calm;Appropriate to situation  Mood Depressed;Sad  Thought Process  Coherency WDL  Content Blaming others  Delusions None reported or observed  Perception WDL  Hallucination None reported or observed  Judgment Poor  Confusion None  Danger to Self  Current suicidal ideation? Denies  Self-Injurious Behavior No self-injurious ideation or behavior indicators observed or expressed   Agreement Not to Harm Self Yes  Description of Agreement verbal contract  Danger to Others  Danger to Others None reported or observed  D. Pt presents with a sad affect, depressed mood- calm, cooperative, but guarded behavior- isolative, remaining in bed throughout the shift.  Pt did not attend groups despite encouragement from staff. Pt reports that he's "ready to go home".  Pt currently denies SI/HI and AVH   A. Labs and vitals monitored. Pt compliant with medications. Pt supported emotionally and encouraged to express concerns and ask questions.   R. Pt remains safe with 15 minute checks. Will continue POC.
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
## 2020-09-16 NOTE — BHH Group Notes (Signed)
LCSW Group Therapy Note  09/16/2020   10:00-11:00am   Type of Therapy and Topic:  Group Therapy: Anger Cues and Responses  Participation Level:  Did Not Attend   Description of Group:   In this group, patients learned how to recognize the physical, cognitive, emotional, and behavioral responses they have to anger-provoking situations.  They identified a recent time they became angry and how they reacted.  They analyzed how their reaction was possibly beneficial and how it was possibly unhelpful.  The group discussed a variety of healthier coping skills that could help with such a situation in the future.  Focus was placed on how helpful it is to recognize the underlying emotions to our anger, because working on those can lead to a more permanent solution as well as our ability to focus on the important rather than the urgent.  Therapeutic Goals: Patients will remember their last incident of anger and how they felt emotionally and physically, what their thoughts were at the time, and how they behaved. Patients will identify how their behavior at that time worked for them, as well as how it worked against them. Patients will explore possible new behaviors to use in future anger situations. Patients will learn that anger itself is normal and cannot be eliminated, and that healthier reactions can assist with resolving conflict rather than worsening situations.  Summary of Patient Progress:  The patient was invited to group, declined to attend.  Therapeutic Modalities:   Cognitive Behavioral Therapy  Jenya Putz J Grossman-Orr   

## 2020-09-16 NOTE — Progress Notes (Signed)
Adult Psychoeducational Group Note  Date:  09/16/2020 Time:  9:45 AM  Group Topic/Focus:  Goals Group:   The focus of this group is to help patients establish daily goals to achieve during treatment and discuss how the patient can incorporate goal setting into their daily lives to aide in recovery.  Participation Level:  Did Not Attend  Deforest Hoyles [facility] 09/16/2020, 9:45 AM

## 2020-09-16 NOTE — Progress Notes (Signed)
  D:  Pt presents with moderate anxiety and depression.  Pt remained in bed with complaints of leg pain. Pt appears sad.  Pt denies SI/HI and AVH.  A:  Labs/Vitals monitored; Medication education provided; Pt encouraged to communicate concerns.  R:  Pt remains safe on unit with q15 minute safety checks.  Will continue POC.      09/15/20 2213  Psych Admission Type (Psych Patients Only)  Admission Status Voluntary  Psychosocial Assessment  Patient Complaints Anxiety;Depression  Eye Contact Brief  Facial Expression Pensive;Pained  Affect Depressed;Sad  Speech Logical/coherent  Interaction Assertive;Minimal  Motor Activity Unsteady  Appearance/Hygiene Disheveled;In scrubs  Behavior Characteristics Appropriate to situation  Mood Depressed;Sad  Thought Process  Coherency WDL  Content Blaming others  Delusions None reported or observed  Perception WDL  Hallucination None reported or observed  Judgment Poor  Confusion None  Danger to Self  Current suicidal ideation? Denies  Self-Injurious Behavior No self-injurious ideation or behavior indicators observed or expressed   Agreement Not to Harm Self Yes  Description of Agreement verbal contract  Danger to Others  Danger to Others None reported or observed

## 2020-09-16 NOTE — Plan of Care (Signed)
  Problem: Education: Goal: Verbalization of understanding the information provided will improve Outcome: Progressing   Problem: Education: Goal: Emotional status will improve Outcome: Not Progressing   Problem: Coping: Goal: Coping ability will improve Outcome: Not Progressing

## 2020-09-17 LAB — GLUCOSE, CAPILLARY
Glucose-Capillary: 184 mg/dL — ABNORMAL HIGH (ref 70–99)
Glucose-Capillary: 291 mg/dL — ABNORMAL HIGH (ref 70–99)

## 2020-09-17 MED ORDER — GABAPENTIN 400 MG PO CAPS: 400.0000 mg | ORAL_CAPSULE | Freq: Three times a day (TID) | ORAL | 0 refills | Status: DC

## 2020-09-17 MED ORDER — SULFAMETHOXAZOLE-TRIMETHOPRIM 800-160 MG PO TABS: 1.0000 | ORAL_TABLET | Freq: Two times a day (BID) | ORAL | 0 refills | Status: DC

## 2020-09-17 MED ORDER — METFORMIN HCL ER 750 MG PO TB24: 750.0000 mg | ORAL_TABLET | Freq: Two times a day (BID) | ORAL | 0 refills | Status: DC

## 2020-09-17 MED ORDER — SERTRALINE HCL 100 MG PO TABS: 100.0000 mg | ORAL_TABLET | Freq: Every day | ORAL | 0 refills | Status: AC

## 2020-09-17 NOTE — [facility] Suicide Risk Assessment (Signed)
[facility] Discharge Suicide Risk Assessment   Principal Problem: MDD (major depressive disorder), recurrent episode, moderate (HCC) Discharge Diagnoses: Principal Problem:   MDD (major depressive disorder), recurrent episode, moderate (HCC) Active Problems:   Cirrhosis (HCC)   Coagulopathy (HCC)   Cachexia (HCC)   Anemia   Alcoholism (HCC)   Hip pain, chronic, right   Total Time spent with patient: 20 minutes Patient is discussed during treatment team, and seen.  Patient reports his mood has improved.  He is no longer endorsing suicidal ideation, plan, or intent.  He denies having access to any weapons to include guns.  He denies any homicidal ideation.  He denies any auditory or visual hallucinations.  He states that his pain has been controlled while in the hospital.  His diabetes has also been controlled.  He will be discharged with sample medication and prescriptions.  He is encouraged to follow up with a primary care provider for his medical diagnoses and his pain.  He has been provided appointments for outpatient mental health services.  He states that he will be living with his friend, Antonio [name] in [place] after discharge.  He is able to contract for safety and knows to return to nearest hospital should his depression symptoms worsen or suicidal thoughts return.  Musculoskeletal: Strength & Muscle Tone: decreased Gait & Station: normal Patient leans: N/A  Psychiatric Specialty Exam  Presentation  General Appearance: Casual  Eye Contact:Good  Speech:Clear and Coherent; Normal Rate  Speech Volume:Normal  Handedness:Right   Mood and Affect  Mood:Euthymic  Duration of Depression Symptoms: Greater than two weeks  Affect:Appropriate   Thought Process  Thought Processes:Linear  Descriptions of Associations:Intact  Orientation:Other (comment) (Uncertain of exact date, but knows year and president as well as person, place and situation.)  Thought Content:Logical  History  of Schizophrenia/Schizoaffective disorder:No  Duration of Psychotic Symptoms:No data recorded Hallucinations:Hallucinations: None Ideas of Reference:None  Suicidal Thoughts:Suicidal Thoughts: No Homicidal Thoughts:Homicidal Thoughts: No  Sensorium  Memory:Immediate Poor; Recent Poor; Remote Poor  Judgment:Fair  Insight:Fair   Executive Functions  Concentration:Good  Attention Span:Good  Recall:Fair  Fund of Knowledge:Good  Language:Good   Psychomotor Activity  Psychomotor Activity: Psychomotor Activity: Normal  Assets  Assets:Communication Skills; Desire for Improvement; Resilience; Social Support   Sleep  Sleep: Sleep: Fair Number of Hours of Sleep: 5.75  Physical Exam: Physical Exam Vitals and nursing note reviewed.  Constitutional:      Comments: thin  HENT:     Head: Normocephalic.  Cardiovascular:     Rate and Rhythm: Normal rate.  Pulmonary:     Effort: Pulmonary effort is normal. No respiratory distress.  Musculoskeletal:        General: Normal range of motion.     Cervical back: Normal range of motion.  Neurological:     General: No focal deficit present.     Mental Status: He is alert.   Review of Systems  Constitutional: Negative.   Respiratory: Negative.    Cardiovascular: Negative.   Musculoskeletal:  Positive for myalgias.       Chronic pain  Neurological: Negative.   Psychiatric/Behavioral:  Negative for depression, hallucinations, memory loss, substance abuse and suicidal ideas. The patient is not nervous/anxious and does not have insomnia.   Blood pressure 102/77, pulse 81, temperature 98.1 F (36.7 C), temperature source Oral, resp. rate 16, height 6\' 2"  (1.88 m), weight 54 kg, SpO2 100 %. Body mass index is 15.28 kg/m.  Mental Status Per Nursing Assessment::   On Admission:  Suicidal ideation indicated by patient, Suicide plan, Self-harm thoughts  Demographic Factors:  Male, Caucasian, Low socioeconomic status, and  Unemployed  Loss Factors: NA  Historical Factors: Prior suicide attempts  Risk Reduction Factors:   Positive social support and Positive coping skills or problem solving skills  Continued Clinical Symptoms:  Depression:   Comorbid alcohol abuse/dependence Chronic Pain  Cognitive Features That Contribute To Risk:  None    Suicide Risk:  Minimal: No identifiable suicidal ideation.  Patients presenting with no risk factors but with morbid ruminations; may be classified as minimal risk based on the severity of the depressive symptoms   Follow-up Information     [facility] Follow up.   Why: You may go to this provider for therapy and medication management services as needed, during the hours of 8 am to 5 pm Monday through Friday. Contact information: [address] [place] [address] [phone]                 Plan Of Care/Follow-up recommendations:  Activity ad lib. Diet as tolerated  On day of discharge following sustained improvement in the affect of this patient, continued report of euthymic mood, repeated denial of suicidal, homicidal, and other violent ideation, adequate interaction with peers, active participation in groups while on the unit, and denial of adverse reactions from medications, the treatment team decided Antonio [name] was stable for discharge home with scheduled mental health treatment as noted above.  He was able to engage in safety planning including plan to return to [facility] or contact emergency services if he feels unable to maintain his own safety or the safety of others. Patient had no further questions, comments, or concerns. Discharge into care of friend, Antonio [name].  Patient aware to return to nearest crisis center, ED or to call 911 for worsening symptoms of depression, suicidal or homicidal thoughts or AVH.   Mariel Craft, MD 09/17/2020, 12:02 PM

## 2020-09-17 NOTE — Progress Notes (Signed)
Pt discharged to lobby. Lyft driver present to transport patient home. Pt was stable and appreciative at that time. All papers, samples,and prescriptions were given and valuables returned. Verbal understanding expressed. Denies SI/HI and A/VH. Pt given opportunity to express concerns and ask questions.

## 2020-09-17 NOTE — Progress Notes (Signed)
  [facility] Adult Case Management Discharge Plan :  Will you be returning to the same living situation after discharge:  No.  Is going to stay with his friend "Cephus Slater" and states she knows he is coming, but will not give consent for [place] to talk with her At discharge, do you have transportation home?: No.  Asks for a Lyft to [address], [place] Do you have the ability to pay for your medications: No.  Would need assistance from local organization  Release of information consent forms completed and patient refused to sign  Patient to Follow up at:  Follow-up Information     [facility] Follow up.   Why: You may go to this provider for therapy and medication management services as needed, during the hours of 8 am to 5 pm Monday through Friday. Contact information: [address] [place] [address] [phone]                 Next level of care provider has access to [facility] Link:no  Safety Planning and Suicide Prevention discussed: No.  Denies     Has patient been referred to the Quitline?: Patient refused referral  Patient has been referred for addiction treatment: Pt. refused referral  Lynnell Chad, LCSW 09/17/2020, 9:33 AM

## 2020-09-17 NOTE — [facility] Group Notes (Signed)
[facility] LCSW Group Therapy Note  09/17/2020    Type of Therapy and Topic:  Group Therapy:  A Hero Worthy of Support  Participation Level:  Did Not Attend   Description of Group:  Patients in this group were introduced to the concept that additional supports including self-support are an essential part of recovery.  Matching needs with supports to help fulfill those needs was explained.  Establishing boundaries that can gradually be increased or decreased was described, with patients giving their own examples of establishing appropriate boundaries in their lives.  A song entitled "My Own Hero" was played and a group discussion ensued in which patients stated it inspired them to help themselves in order to succeed, because other people cannot achieve their goals such as sobriety or stability for them.  A song was played called "I Am Enough" which led to a discussion about being willing to believe we are worth the effort of being a self-support.   Therapeutic Goals: 1)  demonstrate the importance of being a key part of one's own support system 2)  discuss various available supports 3)  encourage patient to use music as part of their self-support and focus on goals 4)  elicit ideas from patients about supports that need to be added   Summary of Patient Progress:  The patient was invited, did not attend  Therapeutic Modalities:   Motivational Interviewing Activity  Lynnell Chad
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
## 2020-09-17 NOTE — [facility] Group Notes (Signed)
The focus of this group is to help patients establish daily goals to achieve during treatment and discuss how the patient can incorporate goal setting into their daily lives to aide in recovery.  Pt did not attend group 

## 2020-09-17 NOTE — Progress Notes (Signed)
     09/16/20 2248  Psych Admission Type (Psych Patients Only)  Admission Status Voluntary  Psychosocial Assessment  Patient Complaints Depression;Other (Comment) (cold)  Eye Contact Brief  Facial Expression Pensive  Affect Depressed;Sad  Speech Logical/coherent  Interaction Assertive;Minimal  Motor Activity Unsteady  Appearance/Hygiene Disheveled;In scrubs  Behavior Characteristics Calm;Appropriate to situation;Cooperative  Mood Depressed;Sad  Thought Process  Coherency WDL  Content Blaming others  Delusions None reported or observed  Perception WDL  Hallucination None reported or observed  Judgment Poor  Confusion None  Danger to Self  Current suicidal ideation? Denies  Self-Injurious Behavior No self-injurious ideation or behavior indicators observed or expressed   Agreement Not to Harm Self Yes  Description of Agreement verbal contract  Danger to Others  Danger to Others None reported or observed

## 2020-09-25 DIAGNOSIS — A419 Sepsis, unspecified organism: Secondary | ICD-10-CM

## 2020-09-30 DIAGNOSIS — R000 Tachycardia, unspecified: Secondary | ICD-10-CM

## 2020-10-01 DIAGNOSIS — I5022 Chronic systolic (congestive) heart failure: Secondary | ICD-10-CM | POA: Diagnosis present

## 2020-10-01 DIAGNOSIS — B379 Candidiasis, unspecified: Secondary | ICD-10-CM | POA: Insufficient documentation

## 2021-01-24 DIAGNOSIS — F419 Anxiety disorder, unspecified: Secondary | ICD-10-CM | POA: Insufficient documentation

## 2021-01-24 DIAGNOSIS — M545 Low back pain, unspecified: Secondary | ICD-10-CM | POA: Insufficient documentation

## 2021-01-24 DIAGNOSIS — R4182 Altered mental status, unspecified: Secondary | ICD-10-CM | POA: Insufficient documentation

## 2021-07-25 DIAGNOSIS — F1510 Other stimulant abuse, uncomplicated: Secondary | ICD-10-CM | POA: Insufficient documentation

## 2021-09-22 ENCOUNTER — Inpatient Hospital Stay (HOSPITAL_COMMUNITY)
Admission: EM | Admit: 2021-09-22 | Discharge: 2021-10-08 | DRG: 871 | Discharge status: Against Medical Advice | Payer: Medicaid Other | Attending: Internal Medicine | Admitting: Internal Medicine

## 2021-09-22 ENCOUNTER — Emergency Department (HOSPITAL_COMMUNITY): Payer: Medicaid Other

## 2021-09-22 ENCOUNTER — Encounter (HOSPITAL_COMMUNITY): Payer: Self-pay | Admitting: Family Medicine

## 2021-09-22 ENCOUNTER — Other Ambulatory Visit: Payer: Self-pay

## 2021-09-22 DIAGNOSIS — R0902 Hypoxemia: Secondary | ICD-10-CM | POA: Diagnosis present

## 2021-09-22 DIAGNOSIS — J188 Other pneumonia, unspecified organism: Secondary | ICD-10-CM

## 2021-09-22 DIAGNOSIS — Z681 Body mass index (BMI) 19 or less, adult: Secondary | ICD-10-CM | POA: Diagnosis not present

## 2021-09-22 DIAGNOSIS — Z5900 Homelessness unspecified: Secondary | ICD-10-CM

## 2021-09-22 DIAGNOSIS — R6520 Severe sepsis without septic shock: Secondary | ICD-10-CM | POA: Diagnosis present

## 2021-09-22 DIAGNOSIS — J90 Pleural effusion, not elsewhere classified: Secondary | ICD-10-CM | POA: Diagnosis present

## 2021-09-22 DIAGNOSIS — E11649 Type 2 diabetes mellitus with hypoglycemia without coma: Secondary | ICD-10-CM | POA: Diagnosis not present

## 2021-09-22 DIAGNOSIS — D638 Anemia in other chronic diseases classified elsewhere: Secondary | ICD-10-CM | POA: Diagnosis present

## 2021-09-22 DIAGNOSIS — E43 Unspecified severe protein-calorie malnutrition: Secondary | ICD-10-CM | POA: Diagnosis present

## 2021-09-22 DIAGNOSIS — F1021 Alcohol dependence, in remission: Secondary | ICD-10-CM | POA: Diagnosis present

## 2021-09-22 DIAGNOSIS — E876 Hypokalemia: Secondary | ICD-10-CM | POA: Diagnosis present

## 2021-09-22 DIAGNOSIS — J9601 Acute respiratory failure with hypoxia: Secondary | ICD-10-CM

## 2021-09-22 DIAGNOSIS — J850 Gangrene and necrosis of lung: Secondary | ICD-10-CM | POA: Diagnosis present

## 2021-09-22 DIAGNOSIS — F331 Major depressive disorder, recurrent, moderate: Secondary | ICD-10-CM | POA: Diagnosis present

## 2021-09-22 DIAGNOSIS — Z79899 Other long term (current) drug therapy: Secondary | ICD-10-CM

## 2021-09-22 DIAGNOSIS — D6959 Other secondary thrombocytopenia: Secondary | ICD-10-CM | POA: Diagnosis present

## 2021-09-22 DIAGNOSIS — Z794 Long term (current) use of insulin: Secondary | ICD-10-CM

## 2021-09-22 DIAGNOSIS — E860 Dehydration: Secondary | ICD-10-CM | POA: Diagnosis present

## 2021-09-22 DIAGNOSIS — Z7984 Long term (current) use of oral hypoglycemic drugs: Secondary | ICD-10-CM

## 2021-09-22 DIAGNOSIS — Z638 Other specified problems related to primary support group: Secondary | ICD-10-CM

## 2021-09-22 DIAGNOSIS — Z86718 Personal history of other venous thrombosis and embolism: Secondary | ICD-10-CM

## 2021-09-22 DIAGNOSIS — A419 Sepsis, unspecified organism: Secondary | ICD-10-CM | POA: Diagnosis not present

## 2021-09-22 DIAGNOSIS — R64 Cachexia: Secondary | ICD-10-CM | POA: Diagnosis present

## 2021-09-22 DIAGNOSIS — Z20822 Contact with and (suspected) exposure to covid-19: Secondary | ICD-10-CM | POA: Diagnosis present

## 2021-09-22 DIAGNOSIS — F1510 Other stimulant abuse, uncomplicated: Secondary | ICD-10-CM | POA: Diagnosis present

## 2021-09-22 DIAGNOSIS — F1910 Other psychoactive substance abuse, uncomplicated: Secondary | ICD-10-CM | POA: Diagnosis present

## 2021-09-22 DIAGNOSIS — J690 Pneumonitis due to inhalation of food and vomit: Secondary | ICD-10-CM | POA: Diagnosis present

## 2021-09-22 DIAGNOSIS — B182 Chronic viral hepatitis C: Secondary | ICD-10-CM | POA: Diagnosis present

## 2021-09-22 DIAGNOSIS — Z5321 Procedure and treatment not carried out due to patient leaving prior to being seen by health care provider: Secondary | ICD-10-CM | POA: Diagnosis not present

## 2021-09-22 DIAGNOSIS — L89892 Pressure ulcer of other site, stage 2: Secondary | ICD-10-CM | POA: Diagnosis present

## 2021-09-22 DIAGNOSIS — Z86711 Personal history of pulmonary embolism: Secondary | ICD-10-CM

## 2021-09-22 DIAGNOSIS — Z91199 Patient's noncompliance with other medical treatment and regimen due to unspecified reason: Secondary | ICD-10-CM | POA: Diagnosis not present

## 2021-09-22 DIAGNOSIS — R7309 Other abnormal glucose: Secondary | ICD-10-CM | POA: Diagnosis not present

## 2021-09-22 DIAGNOSIS — Z66 Do not resuscitate: Secondary | ICD-10-CM | POA: Diagnosis present

## 2021-09-22 DIAGNOSIS — F17210 Nicotine dependence, cigarettes, uncomplicated: Secondary | ICD-10-CM | POA: Diagnosis present

## 2021-09-22 DIAGNOSIS — L8990 Pressure ulcer of unspecified site, unspecified stage: Secondary | ICD-10-CM | POA: Insufficient documentation

## 2021-09-22 DIAGNOSIS — J189 Pneumonia, unspecified organism: Secondary | ICD-10-CM

## 2021-09-22 DIAGNOSIS — E1165 Type 2 diabetes mellitus with hyperglycemia: Secondary | ICD-10-CM | POA: Diagnosis present

## 2021-09-22 DIAGNOSIS — R32 Unspecified urinary incontinence: Secondary | ICD-10-CM | POA: Diagnosis present

## 2021-09-22 DIAGNOSIS — Z7189 Other specified counseling: Secondary | ICD-10-CM | POA: Diagnosis not present

## 2021-09-22 DIAGNOSIS — R627 Adult failure to thrive: Secondary | ICD-10-CM | POA: Diagnosis not present

## 2021-09-22 DIAGNOSIS — F1110 Opioid abuse, uncomplicated: Secondary | ICD-10-CM | POA: Diagnosis present

## 2021-09-22 DIAGNOSIS — F1990 Other psychoactive substance use, unspecified, uncomplicated: Secondary | ICD-10-CM

## 2021-09-22 DIAGNOSIS — Z515 Encounter for palliative care: Secondary | ICD-10-CM | POA: Diagnosis not present

## 2021-09-22 DIAGNOSIS — R1310 Dysphagia, unspecified: Secondary | ICD-10-CM | POA: Diagnosis not present

## 2021-09-22 LAB — CBC WITH DIFFERENTIAL/PLATELET
Abs Immature Granulocytes: 0.04 10*3/uL (ref 0.00–0.07)
Basophils Absolute: 0.1 10*3/uL (ref 0.0–0.1)
Basophils Relative: 1 %
Eosinophils Absolute: 0 10*3/uL (ref 0.0–0.5)
Eosinophils Relative: 0 %
HCT: 35 % — ABNORMAL LOW (ref 39.0–52.0)
Hemoglobin: 11.7 g/dL — ABNORMAL LOW (ref 13.0–17.0)
Immature Granulocytes: 0 %
Lymphocytes Relative: 18 %
Lymphs Abs: 1.7 10*3/uL (ref 0.7–4.0)
MCH: 32.9 pg (ref 26.0–34.0)
MCHC: 33.4 g/dL (ref 30.0–36.0)
MCV: 98.3 fL (ref 80.0–100.0)
Monocytes Absolute: 0.4 10*3/uL (ref 0.1–1.0)
Monocytes Relative: 4 %
Neutro Abs: 7.5 10*3/uL (ref 1.7–7.7)
Neutrophils Relative %: 77 %
Platelets: 174 10*3/uL (ref 150–400)
RBC: 3.56 MIL/uL — ABNORMAL LOW (ref 4.22–5.81)
RDW: 14.1 % (ref 11.5–15.5)
WBC: 9.8 10*3/uL (ref 4.0–10.5)
nRBC: 0 % (ref 0.0–0.2)

## 2021-09-22 LAB — COMPREHENSIVE METABOLIC PANEL
ALT: 40 U/L (ref 0–44)
AST: 25 U/L (ref 15–41)
Albumin: 2.7 g/dL — ABNORMAL LOW (ref 3.5–5.0)
Alkaline Phosphatase: 156 U/L — ABNORMAL HIGH (ref 38–126)
Anion gap: 15 (ref 5–15)
BUN: 28 mg/dL — ABNORMAL HIGH (ref 6–20)
CO2: 20 mmol/L — ABNORMAL LOW (ref 22–32)
Calcium: 9.1 mg/dL (ref 8.9–10.3)
Chloride: 106 mmol/L (ref 98–111)
Creatinine, Ser: 0.97 mg/dL (ref 0.61–1.24)
GFR, Estimated: >60 mL/min (ref >60)
Glucose, Bld: 528 mg/dL (ref 70–99)
Potassium: 4.3 mmol/L (ref 3.5–5.1)
Sodium: 141 mmol/L (ref 135–145)
Total Bilirubin: 1.5 mg/dL — ABNORMAL HIGH (ref 0.3–1.2)
Total Protein: 7.3 g/dL (ref 6.5–8.1)

## 2021-09-22 LAB — RESP PANEL BY RT-PCR (FLU A&B, COVID) ARPGX2
Influenza A by PCR: NEGATIVE
Influenza B by PCR: NEGATIVE
SARS Coronavirus 2 by RT PCR: NEGATIVE

## 2021-09-22 LAB — CBG MONITORING, ED
Glucose-Capillary: 546 mg/dL (ref 70–99)
Glucose-Capillary: 351 mg/dL — ABNORMAL HIGH (ref 70–99)

## 2021-09-22 LAB — I-STAT VENOUS BLOOD GAS, ED
Acid-Base Excess: 0 mmol/L (ref 0.0–2.0)
Bicarbonate: 25 mmol/L (ref 20.0–28.0)
Calcium, Ion: 1.15 mmol/L (ref 1.15–1.40)
HCT: 39 % (ref 39.0–52.0)
Hemoglobin: 13.3 g/dL (ref 13.0–17.0)
O2 Saturation: 100 %
Potassium: 4.5 mmol/L (ref 3.5–5.1)
Sodium: 142 mmol/L (ref 135–145)
TCO2: 26 mmol/L (ref 22–32)
pCO2, Ven: 39 mmHg — ABNORMAL LOW (ref 44–60)
pH, Ven: 7.415 (ref 7.25–7.43)
pO2, Ven: 205 mmHg — ABNORMAL HIGH (ref 32–45)

## 2021-09-22 LAB — PROTIME-INR
INR: 1.3 — ABNORMAL HIGH (ref 0.8–1.2)
Prothrombin Time: 15.6 seconds — ABNORMAL HIGH (ref 11.4–15.2)

## 2021-09-22 LAB — CK: Total CK: 16 U/L — ABNORMAL LOW (ref 49–397)

## 2021-09-22 LAB — LACTIC ACID, PLASMA: Lactic Acid, Venous: 1.3 mmol/L (ref 0.5–1.9)

## 2021-09-22 MED ORDER — INSULIN ASPART 100 UNIT/ML IJ SOLN: 0.0000 [IU] | Freq: Three times a day (TID) | INTRAMUSCULAR | Status: DC

## 2021-09-22 MED ORDER — LACTATED RINGERS IV SOLN: INTRAVENOUS | Status: AC

## 2021-09-22 MED ORDER — SENNOSIDES-DOCUSATE SODIUM 8.6-50 MG PO TABS: 1.0000 | ORAL_TABLET | Freq: Every evening | ORAL | Status: DC | PRN

## 2021-09-22 MED ORDER — LACTATED RINGERS IV SOLN: INTRAVENOUS | Status: DC

## 2021-09-22 MED ORDER — ONDANSETRON HCL 4 MG/2ML IJ SOLN
4.0000 mg | Freq: Four times a day (QID) | INTRAMUSCULAR | Status: DC | PRN
  Administered 2021-09-27: 4 mg via INTRAVENOUS
  Filled 2021-09-22: qty 2

## 2021-09-22 MED ORDER — LACTATED RINGERS IV BOLUS (SEPSIS)
250.0000 mL | Freq: Once | INTRAVENOUS | Status: AC
  Administered 2021-09-22: 250 mL via INTRAVENOUS

## 2021-09-22 MED ORDER — VANCOMYCIN HCL 750 MG/150ML IV SOLN
750.0000 mg | INTRAVENOUS | Status: DC
  Administered 2021-09-23 – 2021-09-25 (×3): 750 mg via INTRAVENOUS
  Filled 2021-09-22 (×4): qty 150

## 2021-09-22 MED ORDER — SODIUM CHLORIDE 0.9% FLUSH
3.0000 mL | Freq: Two times a day (BID) | INTRAVENOUS | Status: DC
  Administered 2021-09-22 – 2021-10-04 (×20): 3 mL via INTRAVENOUS

## 2021-09-22 MED ORDER — GUAIFENESIN 100 MG/5ML PO LIQD
5.0000 mL | ORAL | Status: DC | PRN
  Administered 2021-09-25: 5 mL via ORAL
  Filled 2021-09-22: qty 10

## 2021-09-22 MED ORDER — ENOXAPARIN SODIUM 40 MG/0.4ML IJ SOSY
40.0000 mg | PREFILLED_SYRINGE | INTRAMUSCULAR | Status: DC
  Administered 2021-09-23 – 2021-10-07 (×15): 40 mg via SUBCUTANEOUS
  Filled 2021-09-22 (×16): qty 0.4

## 2021-09-22 MED ORDER — SODIUM CHLORIDE 0.9 % IV SOLN
2.0000 g | Freq: Once | INTRAVENOUS | Status: AC
  Administered 2021-09-22: 2 g via INTRAVENOUS
  Filled 2021-09-22: qty 12.5

## 2021-09-22 MED ORDER — ACETAMINOPHEN 325 MG PO TABS: 650.0000 mg | ORAL_TABLET | Freq: Four times a day (QID) | ORAL | Status: DC | PRN

## 2021-09-22 MED ORDER — INSULIN GLARGINE-YFGN 100 UNIT/ML ~~LOC~~ SOLN
10.0000 [IU] | Freq: Every day | SUBCUTANEOUS | Status: DC
Start: 2021-09-22 — End: 2021-09-30
  Administered 2021-09-23 – 2021-09-29 (×8): 10 [IU] via SUBCUTANEOUS
  Filled 2021-09-22 (×11): qty 0.1

## 2021-09-22 MED ORDER — ONDANSETRON HCL 4 MG PO TABS: 4.0000 mg | ORAL_TABLET | Freq: Four times a day (QID) | ORAL | Status: DC | PRN

## 2021-09-22 MED ORDER — INSULIN ASPART 100 UNIT/ML IJ SOLN
0.0000 [IU] | INTRAMUSCULAR | Status: DC
  Administered 2021-09-22: 9 [IU] via SUBCUTANEOUS
  Administered 2021-09-23: 5 [IU] via SUBCUTANEOUS
  Administered 2021-09-23: 3 [IU] via SUBCUTANEOUS
  Administered 2021-09-23: 1 [IU] via SUBCUTANEOUS
  Administered 2021-09-23: 7 [IU] via SUBCUTANEOUS
  Administered 2021-09-24 (×2): 2 [IU] via SUBCUTANEOUS
  Administered 2021-09-25: 3 [IU] via SUBCUTANEOUS
  Administered 2021-09-25: 2 [IU] via SUBCUTANEOUS
  Administered 2021-09-25 (×2): 1 [IU] via SUBCUTANEOUS
  Administered 2021-09-25 (×2): 2 [IU] via SUBCUTANEOUS
  Administered 2021-09-26: 3 [IU] via SUBCUTANEOUS
  Administered 2021-09-26: 0 [IU] via SUBCUTANEOUS
  Administered 2021-09-26 (×2): 2 [IU] via SUBCUTANEOUS
  Administered 2021-09-26: 1 [IU] via SUBCUTANEOUS
  Administered 2021-09-26: 7 [IU] via SUBCUTANEOUS
  Administered 2021-09-27: 5 [IU] via SUBCUTANEOUS
  Administered 2021-09-28: 9 [IU] via SUBCUTANEOUS
  Administered 2021-09-28: 2 [IU] via SUBCUTANEOUS
  Administered 2021-09-28: 1 [IU] via SUBCUTANEOUS
  Administered 2021-09-28: 3 [IU] via SUBCUTANEOUS
  Administered 2021-09-29: 9 [IU] via SUBCUTANEOUS
  Administered 2021-09-29: 3 [IU] via SUBCUTANEOUS
  Administered 2021-09-29 (×2): 5 [IU] via SUBCUTANEOUS
  Administered 2021-09-29: 7 [IU] via SUBCUTANEOUS
  Administered 2021-09-30: 9 [IU] via SUBCUTANEOUS
  Administered 2021-09-30: 5 [IU] via SUBCUTANEOUS
  Administered 2021-09-30: 9 [IU] via SUBCUTANEOUS
  Administered 2021-09-30: 5 [IU] via SUBCUTANEOUS
  Administered 2021-09-30 (×2): 2 [IU] via SUBCUTANEOUS
  Administered 2021-10-01 (×2): 5 [IU] via SUBCUTANEOUS
  Administered 2021-10-01: 7 [IU] via SUBCUTANEOUS

## 2021-09-22 MED ORDER — ACETAMINOPHEN 650 MG RE SUPP: 650.0000 mg | Freq: Four times a day (QID) | RECTAL | Status: DC | PRN

## 2021-09-22 MED ORDER — VANCOMYCIN HCL 1250 MG/250ML IV SOLN
1250.0000 mg | Freq: Once | INTRAVENOUS | Status: AC
  Administered 2021-09-22: 1250 mg via INTRAVENOUS
  Filled 2021-09-22: qty 250

## 2021-09-22 MED ORDER — INSULIN ASPART 100 UNIT/ML IJ SOLN: 0.0000 [IU] | Freq: Every day | INTRAMUSCULAR | Status: DC

## 2021-09-22 MED ORDER — IOHEXOL 350 MG/ML SOLN
44.0000 mL | Freq: Once | INTRAVENOUS | Status: AC | PRN
  Administered 2021-09-22: 44 mL via INTRAVENOUS

## 2021-09-22 MED ORDER — LACTATED RINGERS IV BOLUS (SEPSIS)
500.0000 mL | Freq: Once | INTRAVENOUS | Status: AC
  Administered 2021-09-22: 500 mL via INTRAVENOUS

## 2021-09-22 MED ORDER — VANCOMYCIN HCL IN DEXTROSE 1-5 GM/200ML-% IV SOLN
1000.0000 mg | Freq: Once | INTRAVENOUS | Status: DC
  Filled 2021-09-22: qty 200

## 2021-09-22 MED ORDER — LACTATED RINGERS IV BOLUS (SEPSIS)
1000.0000 mL | Freq: Once | INTRAVENOUS | Status: AC
  Administered 2021-09-22: 1000 mL via INTRAVENOUS

## 2021-09-22 MED ORDER — SODIUM CHLORIDE 0.9 % IV SOLN
2.0000 g | Freq: Two times a day (BID) | INTRAVENOUS | Status: DC
  Administered 2021-09-23 – 2021-09-26 (×6): 2 g via INTRAVENOUS
  Filled 2021-09-22 (×6): qty 12.5

## 2021-09-22 NOTE — Progress Notes (Signed)
Pharmacy Antibiotic Note  Antonio [name] is a 51 y.o. male for which pharmacy has been consulted for vancomycin and cefepime dosing for pneumonia.  Patient with a history of T2DM, substance abuse. Patient presenting with AMS. Patient reportedly left AMA from [facility] where being treated for PNA (admitted 6/14). Patient unable to tell us when he left AMA. Called  hospital and looks like patient was admitted 6/6-6/9 and then had ED visit on 6/14.  SCr 0.97 - was ~0.6 last year WBC 9.8; LA 1.3; T 98 F; HR 101; RR 24 COVID/Flu neg  Plan: Cefepime 2g q12hr Vancomycin 1250 mg once then 750 mg q24hr (eAUC 428) unless change in renal function Trend WBC, Fever, Renal function, & Clinical course F/u cultures, clinical course, WBC, fever De-escalate when able Levels at steady state F/u MRSA PCR     Temp (24hrs), Avg:98 F (36.7 C), Min:97.9 F (36.6 C), Max:98 F (36.7 C)  No results for input(s): "WBC", "CREATININE", "LATICACIDVEN", "VANCOTROUGH", "VANCOPEAK", "VANCORANDOM", "GENTTROUGH", "GENTPEAK", "GENTRANDOM", "TOBRATROUGH", "TOBRAPEAK", "TOBRARND", "AMIKACINPEAK", "AMIKACINTROU", "AMIKACIN" in the last 168 hours.  CrCl cannot be calculated (Patient's most recent lab result is older than the maximum 21 days allowed.).    Not on File  Antimicrobials this admission: cefepime 6/17 >>  vancomycin 6/17 >>  Microbiology results: Pending  Thank you for allowing pharmacy to be a part of this patient's care.  Delmar Landau, PharmD, BCPS 09/22/2021 5:10 PM ED Clinical Pharmacist -  [phone]

## 2021-09-22 NOTE — ED Notes (Signed)
Patient transported to X-ray 

## 2021-09-22 NOTE — H&P (Signed)
History and Physical    Antonio [name] ERD:[id number] DOB: 1970/08/27 DOA: 09/22/2021  PCP: Gabriel Cirri, DO   Patient coming from: Home   Chief Complaint: SOB, fatigue, chest pain when breathing   HPI: Antonio [name] is a chronically very ill 51 y.o. 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  Patient has difficulty providing coherent history, was admitted at [facility] in April with necrotizing pneumonia, was seen by ID and pulmonology, planned for moxifloxacin and Zyvox with end date May 8, but he unfortunately left AMA on 08/01/2021.  Per EMS, he was admitted at [facility] subsequent to this and left AMA from that facility also, however the patient denies this.  He reports cough productive of white sputum, right-sided chest pain with cough or deep breath, and profound generalized weakness.  ED Course: Upon arrival to the ED, patient is found to be afebrile, saturating mid to upper 90s on 4 L/min of supplemental oxygen, tachypneic, tachycardic, and with systolic blood pressure 110s and greater.  EKG features sinus tachycardia.  CTA chest is negative for PE but concerning for extensive bilateral multifocal pneumonia with cavitary lesions in the left lower lobe and right upper lobe, as well as moderate left pleural effusion.  Chemistry panel notable for glucose 528 with bicarbonate of 20 and normal anion gap.  Albumin was 2.7.  Lactic acid reassuringly normal.  COVID and influenza were negative.  Blood cultures were collected and the patient was given 1.75 L of LR, cefepime, and vancomycin in the ED.  Review of Systems:  All other systems reviewed and apart from HPI, are negative.  Past Medical  History:  Diagnosis Date   Alcoholic hepatitis with ascites    liver bx march 2013, no cirrhosis.  Hep b/c negative   Alcoholism /alcohol abuse 05/2011   Ascites    3 to 4 paracentesis from 2/28 to 06/10/11 at [facility] totalling  19 liters.   DVT (deep venous thrombosis) (HCC) 05/2011   Hepatitis    Pulmonary embolism (HCC) 05/2011   sent home on Xarelto.    Past Surgical History:  Procedure Laterality Date   ANKLE SURGERY     ESOPHAGOGASTRODUODENOSCOPY  3.12.2013   Dr Braulio Conte in [place].  Irregular GE Jx, biopsied benign, naso/SB feeding tube inserted    LIVER BIOPSY  06/2011   transjugular   PARACENTESIS     several during admission at [facility] 2/26- 3/18    Social History:   reports that he has been smoking cigarettes. He has a 3.75 pack-year smoking history. He has never used smokeless tobacco. He reports that he does not drink alcohol and does not use drugs.  Not on File  History reviewed. No pertinent family history.   Prior to Admission medications   Medication Sig Start Date End Date Taking? Authorizing Provider  gabapentin (NEURONTIN) 400 MG capsule Take 1 capsule (400 mg total) by mouth 3 (three) times daily. 09/17/20   [name], NP  metFORMIN (GLUCOPHAGE-XR) 750 MG 24 hr tablet Take 1 tablet (750 mg total) by mouth 2 (two) times daily with a meal. 09/17/20   [name], NP  sertraline (ZOLOFT) 100 MG tablet Take 1 tablet (100 mg total) by mouth daily. 09/18/20   [name], NP  sulfamethoxazole-trimethoprim (BACTRIM DS) 800-160 MG tablet Take 1 tablet by mouth every 12 (twelve) hours. 09/17/20   Ethelene Hal, NP    Physical Exam: Vitals:   09/22/21 1854 09/22/21 1855 09/22/21 2025 09/22/21 2145  BP:   112/84 106/79  Pulse:   (!) 101 (!) 55  Resp:   (!) 24 (!) 22  Temp:      TempSrc:      SpO2: 97%  100% 98%  Weight:  45.4 kg    Height:  5\' 10"  (1.778 m)      Constitutional: Cachectic, not in  acute respiratory distress, no diaphoresis  Eyes: PERTLA, lids and conjunctivae normal ENMT: Mucous membranes are dry. Posterior pharynx clear of any exudate or lesions.   Neck: supple, no masses  Respiratory: Mild tachypnea, rales bilaterally. No accessory muscle use.  Cardiovascular: S1 & S2 heard, regular rate and rhythm. No extremity edema.  Abdomen: No distension, no tenderness, soft. Bowel sounds active.  Musculoskeletal: no clubbing / cyanosis. No joint deformity upper and lower extremities.   Skin: Poor turgor. Warm, dry, well-perfused. Neurologic: Dysarthirc, PERRL, EOMI. Moving all extremities. Alert and oriented to person, place, and situation.  Psychiatric: Calm. Cooperative.    Labs and Imaging on Admission: I have personally reviewed following labs and imaging studies  CBC: Recent Labs  Lab 09/22/21 1643 09/22/21 1758  WBC 9.8  --   NEUTROABS 7.5  --   HGB 11.7* 13.3  HCT 35.0* 39.0  MCV 98.3  --   PLT 174  --    Basic Metabolic Panel: Recent Labs  Lab 09/22/21 1643 09/22/21 1758  NA 141 142  K 4.3 4.5  CL 106  --   CO2 20*  --   GLUCOSE 528*  --   BUN 28*  --   CREATININE 0.97  --   CALCIUM 9.1  --    GFR: Estimated Creatinine Clearance: 59.2 mL/min (by C-G formula based on SCr of 0.97 mg/dL). Liver Function Tests: Recent Labs  Lab 09/22/21 1643  AST 25  ALT 40  ALKPHOS 156*  BILITOT 1.5*  PROT 7.3  ALBUMIN 2.7*   No results for input(s): "LIPASE", "AMYLASE" in the last 168 hours. No results for input(s): "AMMONIA" in the last 168 hours. Coagulation Profile: Recent Labs  Lab 09/22/21 1643  INR 1.3*   Cardiac Enzymes: Recent Labs  Lab 09/22/21 1643  CKTOTAL 16*   BNP (last 3 results) No results for input(s): "PROBNP" in the last 8760 hours. HbA1C: No results for input(s): "HGBA1C" in the last 72 hours. CBG: Recent Labs  Lab 09/22/21 1641 09/22/21 2139  GLUCAP 546* 351*   Lipid Profile: No results for input(s): "CHOL",  "HDL", "LDLCALC", "TRIG", "CHOLHDL", "LDLDIRECT" in the last 72 hours. Thyroid Function Tests: No results for input(s): "TSH", "T4TOTAL", "FREET4", "T3FREE", "THYROIDAB" in the last 72 hours. Anemia Panel: No results for input(s): "VITAMINB12", "FOLATE", "FERRITIN", "TIBC", "IRON", "RETICCTPCT" in the last 72 hours. Urine analysis:    Component Value Date/Time   COLORURINE YELLOW 07/09/2011 [place] 07/09/2011 1341   LABSPEC 1.021 07/09/2011 1341   PHURINE 5.5 07/09/2011 1341   GLUCOSEU NEGATIVE 07/09/2011 1341   HGBUR NEGATIVE 07/09/2011 1341   BILIRUBINUR NEGATIVE 07/09/2011 1341   KETONESUR NEGATIVE 07/09/2011 1341   PROTEINUR NEGATIVE 07/09/2011 1341   UROBILINOGEN 0.2 07/09/2011 1341   NITRITE NEGATIVE 07/09/2011 1341   LEUKOCYTESUR NEGATIVE 07/09/2011 1341   Sepsis Labs: @LABRCNTIP (procalcitonin:4,lacticidven:4) ) Recent Results (from the past 240 hour(s))  Resp Panel by RT-PCR (Flu A&B, Covid) Anterior Nasal Swab     Status: None   Collection Time: 09/22/21  4:55 PM   Specimen: Anterior Nasal Swab  Result Value Ref Range Status   SARS Coronavirus 2 by RT PCR NEGATIVE NEGATIVE Final    Comment: (NOTE) SARS-CoV-2 target nucleic acids are NOT DETECTED.  The SARS-CoV-2 RNA is generally detectable in upper respiratory specimens during the acute phase of infection. The lowest concentration of SARS-CoV-2 viral copies this assay can detect is 138 copies/mL. A negative result does not preclude SARS-Cov-2 infection and should not be used as the sole basis for treatment or other patient management decisions. A negative result may occur with  improper specimen collection/handling, submission of specimen other than nasopharyngeal swab, presence of viral mutation(s) within the areas targeted by this assay, and inadequate number of viral copies(<138 copies/mL). A negative result must be combined with clinical observations, patient history, and  epidemiological information. The expected result is Negative.  Fact Sheet for Patients:  EntrepreneurPulse.com.au  Fact Sheet for Healthcare Providers:  IncredibleEmployment.be  This test is no t yet approved or cleared by the [place] FDA and  has been authorized for detection and/or diagnosis of SARS-CoV-2 by FDA under an Emergency Use Authorization (EUA). This EUA will remain  in effect (meaning this test can be used) for the duration of the COVID-19 declaration under Section 564(b)(1) of the Act, 21 U.S.C.section 360bbb-3(b)(1), unless the authorization is terminated  or revoked sooner.       Influenza A by PCR NEGATIVE NEGATIVE Final   Influenza B by PCR NEGATIVE NEGATIVE Final    Comment: (NOTE) The Xpert Xpress SARS-CoV-2/FLU/RSV plus assay is intended as an aid in the diagnosis of influenza from Nasopharyngeal swab specimens and should not be used as a sole basis for treatment. Nasal washings and aspirates are unacceptable for Xpert Xpress SARS-CoV-2/FLU/RSV testing.  Fact Sheet for Patients: EntrepreneurPulse.com.au  Fact Sheet for Healthcare Providers: IncredibleEmployment.be  This test is not yet approved or cleared by the [place] FDA and has been authorized for detection and/or diagnosis of SARS-CoV-2 by FDA under an Emergency Use Authorization (EUA). This EUA will remain in effect (meaning this test can be used) for the duration of the COVID-19 declaration under Section 564(b)(1) of the Act, 21 U.S.C. section 360bbb-3(b)(1), unless the authorization is terminated or revoked.  Performed at [facility], [place] [address] [place], [place] [address]      Radiological Exams on Admission: CT Angio Chest PE W and/or Wo Contrast  Result Date: 09/22/2021 CLINICAL DATA:  Pulmonary embolism suspected. High probability of septic emboli. Possible PE. EXAM: CT ANGIOGRAPHY CHEST WITH  CONTRAST TECHNIQUE: Multidetector CT imaging of the chest was performed using the standard protocol during bolus administration of intravenous contrast. Multiplanar CT image reconstructions and MIPs were obtained to evaluate the vascular anatomy. RADIATION DOSE REDUCTION: This exam was performed according to the departmental dose-optimization program which includes automated exposure control, adjustment of the mA and/or kV according to patient size and/or use of iterative reconstruction technique. CONTRAST:  90mL OMNIPAQUE IOHEXOL 350 MG/ML SOLN COMPARISON:  One-view chest x-ray 09/22/2021.  CTA chest 10/31/2019 FINDINGS: Cardiovascular: Heart size is normal. Aorta and great vessel origins are within normal limits. Pulmonary artery opacification is excellent. No focal filling defects are present to suggest pulmonary embolus. Mediastinum/Nodes: No enlarged mediastinal, hilar, or axillary lymph nodes. Thyroid gland, trachea, and esophagus demonstrate no significant findings. Lungs/Pleura: Extensive bilateral patchy airspace disease is  present. This most prominent the left lower lobe. A new cavitary lesion is present a medial left lower lobe measuring 18 x 18 mm. Moderate left pleural effusion is present with associated atelectasis. Previously seen right middle lobe airspace disease has cleared. Cavitary lesion in the right upper lobe is now fluid-filled, overall stable in size. Upper Abdomen: Splenomegaly noted. Visualized upper abdomen is otherwise within normal limits. Musculoskeletal: No chest wall abnormality. No acute or significant osseous findings. Review of the MIP images confirms the above findings. IMPRESSION: 1. No pulmonary embolus. 2. Extensive bilateral patchy airspace disease consistent with multifocal pneumonia. 3. New cavitary lesion in the medial left lower lobe. 4. Moderate left pleural effusion with associated atelectasis. 5. Cavitary lesion in the right upper lobe is now fluid-filled, overall  stable in size. 6. Splenomegaly. Electronically Signed   By: Marin Roberts M.D.   On: 09/22/2021 19:56   DG Chest 1 View  Result Date: 09/22/2021 CLINICAL DATA:  Pneumonia EXAM: CHEST  1 VIEW COMPARISON:  09/19/2021 FINDINGS: Cardiac size is within normal limits. Low position of diaphragms may suggest COPD. New patchy infiltrates are noted in the left parahilar region and left lower lung fields. There is no pleural effusion or pneumothorax. IMPRESSION: There is new patchy infiltrate in the left parahilar region and left lower lung fields suggesting pneumonia. Electronically Signed   By: Ernie Avena M.D.   On: 09/22/2021 19:16    EKG: Independently reviewed. Sinus tachycardia, rate 113.   Assessment/Plan   1. Necrotizing pneumonia; pleural effusion; acute hypoxic respiratory failure  - Presents with increased SOB and pleuritic pain since leaving outside hospital AMA during treatment for necrotizing PNA and is found to extensive multifocal PNA with cavitations and left pleural effusion  - Blood cultures remained negative when admitted April, sputum grew Klebsiella and MRSA, and he was on Zyvox and Moxifloxacin per ID recs when he left AMA  - Blood cultures were collected in ED and he was started on cefepime and vancomycin  - Culture sputum, check strep pneumo and legionella antigens, continue current antibiotics, trend procalcitonin, consult IR for thoracentesis, and continue supplemental O2 as needed    2. Uncontrolled type II DM  - A1c was 12.5% in June 2022, serum glucose 528 in ED without DKA  - Check CBGs and use long- and short-acting insulin    3. Dysphagia  - Choking with attempts to sip water in ED, pneumonia likely from aspiration  - NPO for now, consult SLP   4. Protein-calorie malnutrition  - Cachectic with BMI 14  - Dietary consult    5. Substance abuse  - Hx of IVDU with reported abstinence since Mother's Day (May 14th), UDS was negative on admission in April  2023     DVT prophylaxis: Lovenox  Code Status: Full  Level of Care: Level of care: Progressive Family Communication: none present  Disposition Plan:  Patient is from: home  Anticipated d/c is to: TBD Anticipated d/c date is: 09/29/21  Patient currently: Pending improvement in respiratory status, thoracentesis, cultures, safe disposition plan  Consults called: none  Admission status: Inpatient     Briscoe Deutscher, MD [facility]  09/22/2021, 10:18 PM

## 2021-09-22 NOTE — ED Triage Notes (Signed)
Pt via [facility], dx w double pneumonia 6/14 & left AMA during admission. C/o generalized weakness, SHOB, pain "everywhere;" met EMS sepsis criteria. Pt on 4L [place], RA O2 01%  007 systolic HR 121 RR 30 ETCO2 20 CBG 546

## 2021-09-22 NOTE — ED Notes (Signed)
Pt off unit to CT

## 2021-09-22 NOTE — ED Provider Notes (Signed)
[facility] EMERGENCY DEPARTMENT Provider Note   CSN: [id number] Arrival date & time: 09/22/21  1631     History  Chief Complaint  Patient presents with   Altered Mental Status    Antonio [name] is a 51 y.o. male.  51 year old male with a past medical history of uncontrolled type 2 diabetes, polysubstance use and IV drug use, housing insecurity presents to the ED via EMS with concern for shortness of breath.  EMS states that the patient was admitted to [facility] on 09/19/2021 for presumed pneumonia.  EMS states that the patient left AMA and has not been on any antibiotics.  EMS states the patient is homeless and is currently living in a shed which is where they picked him up today.  EMS states that the patient was tachypneic and borderline hypoxic on their arrival, requiring escalation to 4 L nasal cannula.  Point-of-care glucose with EMS in the 500s.  Patient is a poor historian but states that he has had difficulty breathing and productive cough over the last several days.  He does not know when he left the outside hospital.  The history is provided by the EMS personnel.       Home Medications Prior to Admission medications   Medication Sig Start Date End Date Taking? Authorizing Provider  gabapentin (NEURONTIN) 400 MG capsule Take 1 capsule (400 mg total) by mouth 3 (three) times daily. Patient not taking: Reported on 09/22/2021 09/17/20   [name], NP  metFORMIN (GLUCOPHAGE-XR) 750 MG 24 hr tablet Take 1 tablet (750 mg total) by mouth 2 (two) times daily with a meal. Patient not taking: Reported on 09/22/2021 09/17/20   [name], NP  sertraline (ZOLOFT) 100 MG tablet Take 1 tablet (100 mg total) by mouth daily. Patient not taking: Reported on 09/22/2021 09/18/20   [name], NP  sulfamethoxazole-trimethoprim (BACTRIM DS) 800-160 MG tablet Take 1 tablet by mouth every 12 (twelve) hours. Patient not taking: Reported on  09/22/2021 09/17/20   [name], NP      Allergies    Acetaminophen    Review of Systems   Review of Systems  Unable to perform ROS: Mental status change    Physical Exam Updated Vital Signs BP 106/79   Pulse (!) 55   Temp 98 F (36.7 C) (Oral)   Resp (!) 22   Ht 5\' 10"  (1.778 m)   Wt 45.4 kg   SpO2 98%   BMI 14.35 kg/m  Physical Exam Vitals and nursing note reviewed.  Constitutional:      General: He is in acute distress.     Appearance: He is well-developed. He is cachectic. He is ill-appearing.     Comments: Disheveled appearance  HENT:     Head: Normocephalic and atraumatic.     Mouth/Throat:     Mouth: Mucous membranes are dry.  Cardiovascular:     Rate and Rhythm: Regular rhythm. Tachycardia present.     Pulses:          Radial pulses are 2+ on the right side and 2+ on the left side.     Heart sounds: No murmur heard. Pulmonary:     Breath sounds: Decreased breath sounds and rhonchi present.     Comments: Diffusely diminished and rhonchorous breath sounds noted throughout all lung fields, especially in the bilateral bases. Abdominal:     Palpations: Abdomen is soft.     Tenderness: There is no abdominal tenderness.  Musculoskeletal:  Right lower leg: No edema.     Left lower leg: No edema.  Skin:    General: Skin is warm.     Comments: Painless and non-tender palpable purpuric nodules noted over the distal digits of the right hand, concerning for Janeway lesions. Track marks noted to the bilateral upper extremities.  Neurological:     GCS: GCS eye subscore is 4. GCS verbal subscore is 4. GCS motor subscore is 6.     Comments: Somnolent but arousable     ED Results / Procedures / Treatments   Labs (all labs ordered are listed, but only abnormal results are displayed) Labs Reviewed  COMPREHENSIVE METABOLIC PANEL - Abnormal; Notable for the following components:      Result Value   CO2 20 (*)    Glucose, Bld 528 (*)    BUN 28 (*)     Albumin 2.7 (*)    Alkaline Phosphatase 156 (*)    Total Bilirubin 1.5 (*)    All other components within normal limits  CBC WITH DIFFERENTIAL/PLATELET - Abnormal; Notable for the following components:   RBC 3.56 (*)    Hemoglobin 11.7 (*)    HCT 35.0 (*)    All other components within normal limits  PROTIME-INR - Abnormal; Notable for the following components:   Prothrombin Time 15.6 (*)    INR 1.3 (*)    All other components within normal limits  CK - Abnormal; Notable for the following components:   Total CK 16 (*)    All other components within normal limits  I-STAT VENOUS BLOOD GAS, ED - Abnormal; Notable for the following components:   pCO2, Ven 39.0 (*)    pO2, Ven 205 (*)    All other components within normal limits  CBG MONITORING, ED - Abnormal; Notable for the following components:   Glucose-Capillary 546 (*)    All other components within normal limits  CBG MONITORING, ED - Abnormal; Notable for the following components:   Glucose-Capillary 351 (*)    All other components within normal limits  RESP PANEL BY RT-PCR (FLU A&B, COVID) ARPGX2  CULTURE, BLOOD (ROUTINE X 2)  CULTURE, BLOOD (ROUTINE X 2)  URINE CULTURE  EXPECTORATED SPUTUM ASSESSMENT W GRAM STAIN, RFLX TO RESP C  MRSA NEXT GEN BY PCR, NASAL  LACTIC ACID, PLASMA  LACTIC ACID, PLASMA  URINALYSIS, ROUTINE W REFLEX MICROSCOPIC  HIV ANTIBODY (ROUTINE TESTING W REFLEX)  LACTATE DEHYDROGENASE  PROCALCITONIN  PROCALCITONIN  BASIC METABOLIC PANEL  HEPATIC FUNCTION PANEL  MAGNESIUM  CBC  STREP PNEUMONIAE URINARY ANTIGEN  LEGIONELLA PNEUMOPHILA SEROGP 1 UR AG    EKG EKG Interpretation  Date/Time:  Saturday September 22 2021 16:59:55 EDT Ventricular Rate:  113 PR Interval:  155 QRS Duration: 84 QT Interval:  345 QTC Calculation: 473 R Axis:   85 Text Interpretation: Sinus tachycardia Nonspecific T abnrm, anterolateral leads No significant change was found Confirmed by Ezequiel Essex [phone]) on 09/22/2021  5:13:02 PM  Radiology CT Angio Chest PE W and/or Wo Contrast  Result Date: 09/22/2021 CLINICAL DATA:  Pulmonary embolism suspected. High probability of septic emboli. Possible PE. EXAM: CT ANGIOGRAPHY CHEST WITH CONTRAST TECHNIQUE: Multidetector CT imaging of the chest was performed using the standard protocol during bolus administration of intravenous contrast. Multiplanar CT image reconstructions and MIPs were obtained to evaluate the vascular anatomy. RADIATION DOSE REDUCTION: This exam was performed according to the departmental dose-optimization program which includes automated exposure control, adjustment of the mA and/or kV according to  patient size and/or use of iterative reconstruction technique. CONTRAST:  102mL OMNIPAQUE IOHEXOL 350 MG/ML SOLN COMPARISON:  One-view chest x-ray 09/22/2021.  CTA chest 10/31/2019 FINDINGS: Cardiovascular: Heart size is normal. Aorta and great vessel origins are within normal limits. Pulmonary artery opacification is excellent. No focal filling defects are present to suggest pulmonary embolus. Mediastinum/Nodes: No enlarged mediastinal, hilar, or axillary lymph nodes. Thyroid gland, trachea, and esophagus demonstrate no significant findings. Lungs/Pleura: Extensive bilateral patchy airspace disease is present. This most prominent the left lower lobe. A new cavitary lesion is present a medial left lower lobe measuring 18 x 18 mm. Moderate left pleural effusion is present with associated atelectasis. Previously seen right middle lobe airspace disease has cleared. Cavitary lesion in the right upper lobe is now fluid-filled, overall stable in size. Upper Abdomen: Splenomegaly noted. Visualized upper abdomen is otherwise within normal limits. Musculoskeletal: No chest wall abnormality. No acute or significant osseous findings. Review of the MIP images confirms the above findings. IMPRESSION: 1. No pulmonary embolus. 2. Extensive bilateral patchy airspace disease consistent  with multifocal pneumonia. 3. New cavitary lesion in the medial left lower lobe. 4. Moderate left pleural effusion with associated atelectasis. 5. Cavitary lesion in the right upper lobe is now fluid-filled, overall stable in size. 6. Splenomegaly. Electronically Signed   By: San Morelle M.D.   On: 09/22/2021 19:56   DG Chest 1 View  Result Date: 09/22/2021 CLINICAL DATA:  Pneumonia EXAM: CHEST  1 VIEW COMPARISON:  09/19/2021 FINDINGS: Cardiac size is within normal limits. Low position of diaphragms may suggest COPD. New patchy infiltrates are noted in the left parahilar region and left lower lung fields. There is no pleural effusion or pneumothorax. IMPRESSION: There is new patchy infiltrate in the left parahilar region and left lower lung fields suggesting pneumonia. Electronically Signed   By: Elmer Picker M.D.   On: 09/22/2021 19:16    Procedures Procedures   Medications Ordered in ED Medications  insulin glargine-yfgn (SEMGLEE) injection 10 Units (has no administration in time range)  insulin aspart (novoLOG) injection 0-9 Units (9 Units Subcutaneous Given 09/22/21 2144)  lactated ringers infusion ( Intravenous New Bag/Given 09/22/21 2149)  enoxaparin (LOVENOX) injection 40 mg (has no administration in time range)  sodium chloride flush (NS) 0.9 % injection 3 mL (3 mLs Intravenous Given 09/22/21 2151)  acetaminophen (TYLENOL) tablet 650 mg (has no administration in time range)    Or  acetaminophen (TYLENOL) suppository 650 mg (has no administration in time range)  senna-docusate (Senokot-S) tablet 1 tablet (has no administration in time range)  ondansetron (ZOFRAN) tablet 4 mg (has no administration in time range)    Or  ondansetron (ZOFRAN) injection 4 mg (has no administration in time range)  guaiFENesin (ROBITUSSIN) 100 MG/5ML liquid 5 mL (has no administration in time range)  vancomycin (VANCOREADY) IVPB 750 mg/150 mL (has no administration in time range)  ceFEPIme  (MAXIPIME) 2 g in sodium chloride 0.9 % 100 mL IVPB (has no administration in time range)  lactated ringers bolus 1,000 mL (0 mLs Intravenous Stopped 09/22/21 1853)    And  lactated ringers bolus 500 mL (0 mLs Intravenous Stopped 09/22/21 1930)    And  lactated ringers bolus 250 mL (0 mLs Intravenous Stopped 09/22/21 1854)  ceFEPIme (MAXIPIME) 2 g in sodium chloride 0.9 % 100 mL IVPB (0 g Intravenous Stopped 09/22/21 1812)  vancomycin (VANCOREADY) IVPB 1250 mg/250 mL (0 mg Intravenous Stopped 09/22/21 2152)  iohexol (OMNIPAQUE) 350 MG/ML injection 44 mL (44  mLs Intravenous Contrast Given 09/22/21 1942)    ED Course/ Medical Decision Making/ A&P                           Medical Decision Making Amount and/or Complexity of Data Reviewed Independent Historian: EMS Labs: ordered. Radiology: ordered and independent interpretation performed. ECG/medicine tests: ordered and independent interpretation performed.  Risk Prescription drug management. Decision regarding hospitalization.   51 year old chronically ill male with a history as above presents today with shortness of breath and cough.  EMS reported that the patient had been recently admitted for pneumonia in [facility] where he reportedly left AMA prior to completing treatment.  In route, he was found to be hypoxic and placed on 4 L nasal cannula.  On arrival, patient is tachycardic, tachypneic but is able to speak in full sentences on 4 L nasal cannula.  Initial suspicion for likely severe sepsis secondary to recurrent pneumonia.  I am additionally concerned for possible septic emboli, pulmonary embolism, and endocarditis given his high risk drug use history.  He was empirically treated with IV fluids (30 cc/kg) and empiric antibiotics for possible hospital associated infection.  I reviewed and interpreted the patient's labs which do not reveal significant leukocytosis or leukopenia.  Hemoglobin appears to be grossly at baseline.  CMP with  hyperglycemia at 528 without associated anion gap metabolic acidosis to support DKA.  VBG with pH of 7.41.  No evidence of renal dysfunction. He additionally is likely severely malnourished with an albumin of 2.7.  Lactic acid reassuring at 1.3.  I additionally reviewed and interpreted the patient's chest x-ray which is concerning for patchy opacities bilaterally, specifically in the left hilar region likely representing pneumonia.  CTA PE was also obtained without evidence of thromboembolic disease.  However, he was found to have a new cavitary lesion in the medial left lower lobe and given his history, cannot rule out developing necrotizing infection.  Following fluid resuscitation, heart rate had improved and pressures remained stable.  I spoke with the inpatient medicine team who is agreeable to accept the patient to the floor.  Plan of care was discussed the patient who verbalized understanding.  No further acute events or interventions prior to assumption of care by the inpatient team.  Final Clinical Impression(s) / ED Diagnoses Final diagnoses:  Multifocal pneumonia  Hypoxia  Recurrent pneumonia  IVDU (intravenous drug user)    Rx / DC Orders ED Discharge Orders     None         Banks Chaikin, [place], MD 09/22/21 2235    Glynn Octave, MD 09/22/21 2340

## 2021-09-23 ENCOUNTER — Inpatient Hospital Stay (HOSPITAL_COMMUNITY): Payer: Medicaid Other

## 2021-09-23 DIAGNOSIS — L8990 Pressure ulcer of unspecified site, unspecified stage: Secondary | ICD-10-CM | POA: Insufficient documentation

## 2021-09-23 DIAGNOSIS — J189 Pneumonia, unspecified organism: Secondary | ICD-10-CM | POA: Diagnosis not present

## 2021-09-23 LAB — GLUCOSE, CAPILLARY
Glucose-Capillary: 144 mg/dL — ABNORMAL HIGH (ref 70–99)
Glucose-Capillary: 335 mg/dL — ABNORMAL HIGH (ref 70–99)
Glucose-Capillary: 115 mg/dL — ABNORMAL HIGH (ref 70–99)
Glucose-Capillary: 207 mg/dL — ABNORMAL HIGH (ref 70–99)
Glucose-Capillary: 241 mg/dL — ABNORMAL HIGH (ref 70–99)
Glucose-Capillary: 261 mg/dL — ABNORMAL HIGH (ref 70–99)
Glucose-Capillary: 114 mg/dL — ABNORMAL HIGH (ref 70–99)

## 2021-09-23 LAB — CBC
HCT: 32.5 % — ABNORMAL LOW (ref 39.0–52.0)
Hemoglobin: 10.8 g/dL — ABNORMAL LOW (ref 13.0–17.0)
MCH: 32.3 pg (ref 26.0–34.0)
MCHC: 33.2 g/dL (ref 30.0–36.0)
MCV: 97.3 fL (ref 80.0–100.0)
Platelets: 158 10*3/uL (ref 150–400)
RBC: 3.34 MIL/uL — ABNORMAL LOW (ref 4.22–5.81)
RDW: 14.2 % (ref 11.5–15.5)
WBC: 9 10*3/uL (ref 4.0–10.5)
nRBC: 0 % (ref 0.0–0.2)

## 2021-09-23 LAB — BASIC METABOLIC PANEL
Anion gap: 10 (ref 5–15)
BUN: 19 mg/dL (ref 6–20)
CO2: 23 mmol/L (ref 22–32)
Calcium: 8.9 mg/dL (ref 8.9–10.3)
Chloride: 107 mmol/L (ref 98–111)
Creatinine, Ser: 0.77 mg/dL (ref 0.61–1.24)
GFR, Estimated: >60 mL/min (ref >60)
Glucose, Bld: 251 mg/dL — ABNORMAL HIGH (ref 70–99)
Potassium: 3.5 mmol/L (ref 3.5–5.1)
Sodium: 140 mmol/L (ref 135–145)

## 2021-09-23 LAB — URINALYSIS, ROUTINE W REFLEX MICROSCOPIC
Bacteria, UA: NONE SEEN
Bilirubin Urine: NEGATIVE
Glucose, UA: >=500 mg/dL — AB
Hgb urine dipstick: NEGATIVE
Ketones, ur: 20 mg/dL — AB
Leukocytes,Ua: NEGATIVE
Nitrite: NEGATIVE
Protein, ur: NEGATIVE mg/dL
Specific Gravity, Urine: 1.041 — ABNORMAL HIGH (ref 1.005–1.030)
pH: 5 (ref 5.0–8.0)

## 2021-09-23 LAB — HEPATIC FUNCTION PANEL
ALT: 35 U/L (ref 0–44)
AST: 26 U/L (ref 15–41)
Albumin: 2.5 g/dL — ABNORMAL LOW (ref 3.5–5.0)
Alkaline Phosphatase: 130 U/L — ABNORMAL HIGH (ref 38–126)
Bilirubin, Direct: 0.3 mg/dL — ABNORMAL HIGH (ref 0.0–0.2)
Indirect Bilirubin: 0.4 mg/dL (ref 0.3–0.9)
Total Bilirubin: 0.7 mg/dL (ref 0.3–1.2)
Total Protein: 6.8 g/dL (ref 6.5–8.1)

## 2021-09-23 LAB — HIV ANTIBODY (ROUTINE TESTING W REFLEX): HIV Screen 4th Generation wRfx: NONREACTIVE

## 2021-09-23 LAB — PROCALCITONIN
Procalcitonin: 2.08 ng/mL
Procalcitonin: 2.09 ng/mL

## 2021-09-23 LAB — LACTATE DEHYDROGENASE: LDH: 108 U/L (ref 98–192)

## 2021-09-23 LAB — MRSA NEXT GEN BY PCR, NASAL: MRSA by PCR Next Gen: DETECTED — AB

## 2021-09-23 LAB — STREP PNEUMONIAE URINARY ANTIGEN: Strep Pneumo Urinary Antigen: NEGATIVE

## 2021-09-23 LAB — PHOSPHORUS: Phosphorus: 2.1 mg/dL — ABNORMAL LOW (ref 2.5–4.6)

## 2021-09-23 LAB — LACTIC ACID, PLASMA
Lactic Acid, Venous: 2.3 mmol/L (ref 0.5–1.9)
Lactic Acid, Venous: 1 mmol/L (ref 0.5–1.9)

## 2021-09-23 LAB — MAGNESIUM: Magnesium: 1.7 mg/dL (ref 1.7–2.4)

## 2021-09-23 MED ORDER — MUPIROCIN 2 % EX OINT
1.0000 | TOPICAL_OINTMENT | Freq: Two times a day (BID) | CUTANEOUS | Status: AC
  Administered 2021-09-23 – 2021-09-26 (×8): 1 via NASAL
  Filled 2021-09-23 (×2): qty 22

## 2021-09-23 MED ORDER — CHLORHEXIDINE GLUCONATE CLOTH 2 % EX PADS
6.0000 | MEDICATED_PAD | Freq: Every day | CUTANEOUS | Status: AC
  Administered 2021-09-23 – 2021-09-27 (×5): 6 via TOPICAL

## 2021-09-23 NOTE — Progress Notes (Signed)
PROGRESS NOTE    Antonio [name]  WEX:[id number] DOB: 08-Feb-1971 DOA: 09/22/2021 PCP: Gabriel Cirri, DO   Brief Narrative:  Antonio [name] is a cachectic and chronically ill appearing 51 y.o. 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  He was initially planned for IV antibiotics through 08/13/2021.    In the interim, likely due to noncompliance with antibiotic therapy, patient's respiratory status and clinical condition deteriorated with worsening shortness of breath fatigue and pleuritic pain who ultimately presented to our facility on 09/22/2021 with imaging and work-up consistent with ongoing necrotizing pneumonia.  Assessment & Plan:   Principal Problem:   Multifocal pneumonia Active Problems:   MDD (major depressive disorder), recurrent episode, moderate (HCC)   Polysubstance abuse (HCC)   Uncontrolled type 2 diabetes mellitus with hyperglycemia, without long-term current use of insulin (HCC)   Pleural effusion, left   Protein-calorie malnutrition, severe (HCC)   Pressure injury of skin  Severe sepsis secondary to necrotizing pneumonia, POA Concurrent pleural effusion Concurrent acute hypoxic respiratory failure  -Continue broad-spectrum IV antibiotics including cefepime, vancomycin -Sepsis criteria at intake including tachycardia, tachypnea with notable source of infection -IR requested in consult at intake for possible evaluation of parapneumonic fluid for possible drainage and culture, currently recommending repeat imaging in 2 to 3 days for further evaluation of fluid if not clinically  improving versus thoracentesis in the interim pending further evaluation. -Cultures currently pending, previous hospitalization showed MRSA and Klebsiella positive cultures at that time, in-house cultures are pending -Continue to wean oxygen as tolerated, rapid response overnight with acutely worsening hypoxia concerning for possible aspiration, patient remains n.p.o. until speech can evaluate   -Oxygen weaning more appropriately today, remains on 11 to 9 L high flow nasal cannula depending on positioning and respiratory status.  Sats remained in the 90s   Uncontrolled type II DM with hyperglycemia - A1c 12.5% -Continue sliding scale insulin, hypoglycemic protocol -Likely liberalize diet in the next 24 to 48 hours if safe to take p.o. given below   Dysphagia  -Profoundly failed speech evaluation today at bedside, planned modified barium study in the next 24 hours - NPO for now, consult SLP    Severe protein-calorie malnutrition  - Cachectic with BMI 14  - Remains unsafe to feed at this point given above profound dysphagia -We will likely liberalize diet once patient safe to eat, appreciate dietary insight and recommendations   Polysubstance abuse, ongoing -Patient's UDS positive for opiates and amphetamines during his last 2 hospitalizations; at [facility] in April he tested positive for Dilaudid, oxymorphone, oxycodone and morphine -Patient reports recent abstinence of narcotics since May 14  -Follow closely for signs and symptoms of withdrawal  Profound noncompliance -Patient attempting to take p.o. today despite being ordered n.p.o. at bedside educated on the risks of aspiration -Recently left previous hospital despite profound and advanced infection process for cigarettes -offered nicotine patch which patient adamantly refused this morning -We discussed that should patient continue to be noncompliant with his medical care he is only likely to place himself at high risk for morbidity  and mortality and ultimately death should he continue to attempt to take p.o. inappropriately given his high risk  for aspiration   DVT prophylaxis: Lovenox  Code Status: Full  Family Communication: None present  Status is: Inpatient  Dispo: The patient is from: Home              Anticipated d/c is to: Home              Anticipated d/c date is: To be determined              Patient currently not medically stable for discharge  Consultants:  IR  Procedures:  None  Antimicrobials:  Cefepime, vancomycin  Subjective: No acute issues or events overnight, lengthy discussion at bedside this morning about risk factors for attempted to take p.o. without clearance first, patient was demanding a Coke zero and food which we discussed was not appropriate at this time.  Objective: Vitals:   09/22/21 2345 09/23/21 0039 09/23/21 0043 09/23/21 0613  BP: 101/79  100/81 115/80  Pulse: (!) 116  (!) 107 (!) 108  Resp: (!) 25  (!) 25 16  Temp:   98.5 F (36.9 C) 98.4 F (36.9 C)  TempSrc:   Oral Oral  SpO2: 91%  100% 90%  Weight:  43.4 kg    Height:  5\' 9"  (1.753 m)      Intake/Output Summary (Last 24 hours) at 09/23/2021 0825 Last data filed at 09/23/2021 0612 Gross per 24 hour  Intake 893.97 ml  Output 400 ml  Net 493.97 ml   Filed Weights   09/22/21 1855 09/23/21 0039  Weight: 45.4 kg 43.4 kg    Examination:  General: Cachectic gentleman resting comfortably in bed, no acute distress; diffuse muscle wasting noted Lungs: Rhonchorous breath sounds bilaterally, PMI left lower lobe Heart: Tachycardic, regular rhythm without murmur rubs or gallops Abdomen: Gaunt, without guarding or rebound. Extremities: Got, without cyanosis, clubbing, edema Skin: Warm dry, numerous tattoos throughout without overt ulcerations or wound  Data Reviewed: I have personally reviewed following labs and imaging studies  CBC: Recent Labs  Lab 09/22/21 1643 09/22/21 1758 09/23/21 0214  WBC 9.8  --   9.0  NEUTROABS 7.5  --   --   HGB 11.7* 13.3 10.8*  HCT 35.0* 39.0 32.5*  MCV 98.3  --  97.3  PLT 174  --  158   Basic Metabolic Panel: Recent Labs  Lab 09/22/21 1643 09/22/21 1758 09/23/21 0214  NA 141 142 140  K 4.3 4.5 3.5  CL 106  --  107  CO2 20*  --  23  GLUCOSE 528*  --  251*  BUN 28*  --  19  CREATININE 0.97  --  0.77  CALCIUM 9.1  --  8.9  MG  --   --  1.7  PHOS  --   --  2.1*   GFR: Estimated Creatinine Clearance: 68.6 mL/min (by C-G formula based on SCr of 0.77 mg/dL). Liver Function Tests: Recent Labs  Lab 09/22/21 1643 09/23/21 0214  AST 25 26  ALT 40 35  ALKPHOS 156* 130*  BILITOT 1.5* 0.7  PROT 7.3 6.8  ALBUMIN 2.7* 2.5*   No results for input(s): "LIPASE", "AMYLASE" in the last 168 hours. No results for input(s): "AMMONIA" in the last 168 hours. Coagulation Profile: Recent Labs  Lab 09/22/21 1643  INR 1.3*   Cardiac Enzymes: Recent Labs  Lab 09/22/21 1643  CKTOTAL 16*   BNP (last 3 results) No results for input(s): "PROBNP" in the last 8760 hours. HbA1C: No results for input(s): "HGBA1C" in the last 72  hours. CBG: Recent Labs  Lab 09/22/21 1641 09/22/21 2139 09/23/21 0037 09/23/21 0552  GLUCAP 546* 351* 335* 144*   Lipid Profile: No results for input(s): "CHOL", "HDL", "LDLCALC", "TRIG", "CHOLHDL", "LDLDIRECT" in the last 72 hours. Thyroid Function Tests: No results for input(s): "TSH", "T4TOTAL", "FREET4", "T3FREE", "THYROIDAB" in the last 72 hours. Anemia Panel: No results for input(s): "VITAMINB12", "FOLATE", "FERRITIN", "TIBC", "IRON", "RETICCTPCT" in the last 72 hours. Sepsis Labs: Recent Labs  Lab 09/22/21 1643 09/23/21 0214  PROCALCITON  --  2.09  2.08  LATICACIDVEN 1.3 2.3*    Recent Results (from the past 240 hour(s))  Resp Panel by RT-PCR (Flu A&B, Covid) Anterior Nasal Swab     Status: None   Collection Time: 09/22/21  4:55 PM   Specimen: Anterior Nasal Swab  Result Value Ref Range Status   SARS  Coronavirus 2 by RT PCR NEGATIVE NEGATIVE Final    Comment: (NOTE) SARS-CoV-2 target nucleic acids are NOT DETECTED.  The SARS-CoV-2 RNA is generally detectable in upper respiratory specimens during the acute phase of infection. The lowest concentration of SARS-CoV-2 viral copies this assay can detect is 138 copies/mL. A negative result does not preclude SARS-Cov-2 infection and should not be used as the sole basis for treatment or other patient management decisions. A negative result may occur with  improper specimen collection/handling, submission of specimen other than nasopharyngeal swab, presence of viral mutation(s) within the areas targeted by this assay, and inadequate number of viral copies(<138 copies/mL). A negative result must be combined with clinical observations, patient history, and epidemiological information. The expected result is Negative.  Fact Sheet for Patients:  EntrepreneurPulse.com.au  Fact Sheet for Healthcare Providers:  IncredibleEmployment.be  This test is no t yet approved or cleared by the [place] FDA and  has been authorized for detection and/or diagnosis of SARS-CoV-2 by FDA under an Emergency Use Authorization (EUA). This EUA will remain  in effect (meaning this test can be used) for the duration of the COVID-19 declaration under Section 564(b)(1) of the Act, 21 U.S.C.section 360bbb-3(b)(1), unless the authorization is terminated  or revoked sooner.       Influenza A by PCR NEGATIVE NEGATIVE Final   Influenza B by PCR NEGATIVE NEGATIVE Final    Comment: (NOTE) The Xpert Xpress SARS-CoV-2/FLU/RSV plus assay is intended as an aid in the diagnosis of influenza from Nasopharyngeal swab specimens and should not be used as a sole basis for treatment. Nasal washings and aspirates are unacceptable for Xpert Xpress SARS-CoV-2/FLU/RSV testing.  Fact Sheet for  Patients: EntrepreneurPulse.com.au  Fact Sheet for Healthcare Providers: IncredibleEmployment.be  This test is not yet approved or cleared by the [place] FDA and has been authorized for detection and/or diagnosis of SARS-CoV-2 by FDA under an Emergency Use Authorization (EUA). This EUA will remain in effect (meaning this test can be used) for the duration of the COVID-19 declaration under Section 564(b)(1) of the Act, 21 U.S.C. section 360bbb-3(b)(1), unless the authorization is terminated or revoked.  Performed at [facility], [place] [address], [place], [place] [address]   MRSA Next Gen by PCR, Nasal     Status: Abnormal   Collection Time: 09/23/21  1:00 AM  Result Value Ref Range Status   MRSA by PCR Next Gen DETECTED (A) NOT DETECTED Final    Comment: RESULT CALLED TO, READ BACK BY AND VERIFIED WITH: RN SIERRA DONNELL 09/23/21@2 :25 BY TW (NOTE) The GeneXpert MRSA Assay (FDA approved for NASAL specimens only), is one component  of a comprehensive MRSA colonization surveillance program. It is not intended to diagnose MRSA infection nor to guide or monitor treatment for MRSA infections. Test performance is not FDA approved in patients less than 65 years old. Performed at [facility], [address] [place], [place] [address]          Radiology Studies: CT Angio Chest PE W and/or Wo Contrast  Result Date: 09/22/2021 CLINICAL DATA:  Pulmonary embolism suspected. High probability of septic emboli. Possible PE. EXAM: CT ANGIOGRAPHY CHEST WITH CONTRAST TECHNIQUE: Multidetector CT imaging of the chest was performed using the standard protocol during bolus administration of intravenous contrast. Multiplanar CT image reconstructions and MIPs were obtained to evaluate the vascular anatomy. RADIATION DOSE REDUCTION: This exam was performed according to the departmental dose-optimization program which includes automated exposure  control, adjustment of the mA and/or kV according to patient size and/or use of iterative reconstruction technique. CONTRAST:  28mL OMNIPAQUE IOHEXOL 350 MG/ML SOLN COMPARISON:  One-view chest x-ray 09/22/2021.  CTA chest 10/31/2019 FINDINGS: Cardiovascular: Heart size is normal. Aorta and great vessel origins are within normal limits. Pulmonary artery opacification is excellent. No focal filling defects are present to suggest pulmonary embolus. Mediastinum/Nodes: No enlarged mediastinal, hilar, or axillary lymph nodes. Thyroid gland, trachea, and esophagus demonstrate no significant findings. Lungs/Pleura: Extensive bilateral patchy airspace disease is present. This most prominent the left lower lobe. A new cavitary lesion is present a medial left lower lobe measuring 18 x 18 mm. Moderate left pleural effusion is present with associated atelectasis. Previously seen right middle lobe airspace disease has cleared. Cavitary lesion in the right upper lobe is now fluid-filled, overall stable in size. Upper Abdomen: Splenomegaly noted. Visualized upper abdomen is otherwise within normal limits. Musculoskeletal: No chest wall abnormality. No acute or significant osseous findings. Review of the MIP images confirms the above findings. IMPRESSION: 1. No pulmonary embolus. 2. Extensive bilateral patchy airspace disease consistent with multifocal pneumonia. 3. New cavitary lesion in the medial left lower lobe. 4. Moderate left pleural effusion with associated atelectasis. 5. Cavitary lesion in the right upper lobe is now fluid-filled, overall stable in size. 6. Splenomegaly. Electronically Signed   By: Marin Roberts M.D.   On: 09/22/2021 19:56   DG Chest 1 View  Result Date: 09/22/2021 CLINICAL DATA:  Pneumonia EXAM: CHEST  1 VIEW COMPARISON:  09/19/2021 FINDINGS: Cardiac size is within normal limits. Low position of diaphragms may suggest COPD. New patchy infiltrates are noted in the left parahilar region and left  lower lung fields. There is no pleural effusion or pneumothorax. IMPRESSION: There is new patchy infiltrate in the left parahilar region and left lower lung fields suggesting pneumonia. Electronically Signed   By: Ernie Avena M.D.   On: 09/22/2021 19:16        Scheduled Meds:  Chlorhexidine Gluconate Cloth  6 each Topical Q0600   enoxaparin (LOVENOX) injection  40 mg Subcutaneous Q24H   insulin aspart  0-9 Units Subcutaneous Q4H   insulin glargine-yfgn  10 Units Subcutaneous QHS   mupirocin ointment  1 Application Nasal BID   sodium chloride flush  3 mL Intravenous Q12H   Continuous Infusions:  ceFEPime (MAXIPIME) IV 200 mL/hr at 09/23/21 0600   lactated ringers Stopped (09/23/21 0556)   vancomycin       LOS: 1 day   Time spent:  Azucena Fallen, DO [facility]  If 7PM-7AM, please contact night-coverage [email or web address]  09/23/2021, 8:25 AM

## 2021-09-23 NOTE — Evaluation (Signed)
Clinical/Bedside Swallow Evaluation Patient Details  Name: Antonio [name] MRN: [id number] Date of Birth: 1971/03/21  Today's Date: 09/23/2021 Time: SLP Start Time (ACUTE ONLY): 1030 SLP Stop Time (ACUTE ONLY): 1045 SLP Time Calculation (min) (ACUTE ONLY): 15 min  Past Medical History:  Past Medical History:  Diagnosis Date   Alcoholic hepatitis with ascites    liver bx march 2013, no cirrhosis.  Hep b/c negative   Alcoholism /alcohol abuse 05/2011   Ascites    3 to 4 paracentesis from 2/28 to 06/10/11 at [facility] totalling  19 liters.   DVT (deep venous thrombosis) (HCC) 05/2011   Hepatitis    Pulmonary embolism (HCC) 05/2011   sent home on Xarelto.   Past Surgical History:  Past Surgical History:  Procedure Laterality Date   ANKLE SURGERY     ESOPHAGOGASTRODUODENOSCOPY  3.12.2013   Dr Braulio Conte in [place].  Irregular GE Jx, biopsied benign, naso/SB feeding tube inserted    LIVER BIOPSY  06/2011   transjugular   PARACENTESIS     several during admission at [facility] 2/26- 3/18   HPI:  Antonio [name] is a chronically very ill 51 y.o. homeless male admitted with necrotizing pna, dysphagia.  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 Pt was noted to choke with liquids in the ED, hence swallow evaluation ordered.    Assessment / Plan / Recommendation  Clinical Impression  Antonio [name] presents with potential dysphagia with coughing associated with all PO intake (thin liquids > solids).  His speech is dysarthric. Oral motor exam unremarkable, no focal CN deficits. It is difficult to discern baseline coughing related to respiratory condition from coughing related to potential dysphagia.  He did not like thickened liquids and declined after a few sips.  Otherwise, he was pleasant and  interactive, but very difficult to understand.  He may benefit from an MBS in the next few days to determine physiology of swallowing and potential contribution to respiratory status. D/W RN. Continue PO diet -dysphagia 3/thin - pending MBS. SLP Visit Diagnosis: Dysphagia, unspecified (R13.10)    Aspiration Risk  Risk for inadequate nutrition/hydration    Diet Recommendation   Dysphagia 3/thin liquids   Medication Administration: Whole meds with puree    Other  Recommendations Oral Care Recommendations: Oral care BID    Recommendations for follow up therapy are one component of a multi-disciplinary discharge planning process, led by the attending physician.  Recommendations may be updated based on patient status, additional functional criteria and insurance authorization.  Follow up Recommendations   tba                        Prognosis Barriers to Reach Goals: Motivation      Swallow Study   General HPI: Antonio [name] is a chronically very ill 51 y.o. homeless male admitted with necrotizing pna, dysphagia.  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 Pt was noted to choke with liquids in the ED, hence swallow evaluation ordered. Type of Study: Bedside Swallow Evaluation Previous Swallow Assessment: no Diet Prior to this Study: NPO Temperature Spikes Noted: No Respiratory Status: Nasal cannula History of Recent Intubation: No Behavior/Cognition: Alert;Pleasant mood Oral Care Completed by  SLP: No Oral Cavity - Dentition: Missing dentition Vision: Functional for self-feeding Self-Feeding Abilities: Able to feed self Patient Positioning: Upright in bed Baseline Vocal Quality: Normal Volitional Cough: Congested Volitional Swallow: Able to elicit    Oral/Motor/Sensory Function Overall Oral  Motor/Sensory Function: Within functional limits   Ice Chips Ice chips: Within functional limits   Thin Liquid Thin Liquid: Impaired Presentation: Cup;Straw Pharyngeal  Phase Impairments: Cough - Immediate    Nectar Thick Nectar Thick Liquid: Not tested   Honey Thick Honey Thick Liquid: Impaired Presentation: Cup Pharyngeal Phase Impairments: Cough - Immediate   Puree Puree: Impaired Presentation: Spoon;Self Fed Pharyngeal Phase Impairments: Cough - Immediate   Solid     Solid: Impaired Pharyngeal Phase Impairments: Cough - Immediate      Antonio [name] 09/23/2021,11:08 AM  Antonio Folks L. [name], MA CCC/SLP Clinical Specialist - Acute Care SLP Acute Rehabilitation Services Office number [phone]

## 2021-09-23 NOTE — Progress Notes (Addendum)
TR[facility] night cross cover note:   I was notified by RN of lactic acid level of 2.3 in this patient who is admitted with necrotizing pneumonia complicated by acute hypoxic respiratory failure.  Blood cultures x2 drawn at time of admission.  Currently on broad-spectrum IV antibiotics in the form IV vancomycin and cefepime.  Is s/p 30 mL/kg LR bolus, and is currently on continuous LR.   I've ordered repeat LA level.     UPDATE: In the setting of presenting acute hypoxic respiratory failure, the patient has been maintaining oxygen saturations in the low to mid 90s on 4 L nasal cannula since time of admission, but has subsequently had an increase in his supplemental oxygen requirements.  On the aforementioned 4 L nasal cannula, patient's oxygen saturations have decreased into the low to mid 80s, prompting initiation of [place] HFNC, with ensuing oxygen saturation improving to 91% on 13 L.   I have placed an order for prn full HFNC with capability of 40 L/min in case further escalation in supplemental oxygen delivery is subsequently needed. Humidity added to supplemental oxygen.  Flutter valve.  Incentive spirometry.  I have also placed order for add on phosphorus level. IR consulted for diagnostic/therapeutic thoracentesis in setting of patient's presenting pleural effusion.     Newton Pigg, DO Hospitalist

## 2021-09-23 NOTE — Progress Notes (Signed)
Patient was unable to complete admission screening due to his difficulty to clearly speak.

## 2021-09-23 NOTE — Progress Notes (Signed)
Patient admitted with necrotizing pneumonia, respiratory failure - found to have small left pleural effusion on CTA chest yesterday. Request to IR for possible diagnostic thoracentesis.  Limited left chest US shows a trace amount of pleural fluid in near the scapula which is not amenable to percutaneous aspiration. There is also a very small amount of complex fluid vs consolidated lung 2 rib spaces down, this does not appear to be free fluid and would favor consolidated lung in this scenario given CTA does not show fluid to be complex. Because of this and the very small size thoracentesis was not attempted.   Recommend repeat imaging in 2-3 days if patient does not improve to assess fluid collection and if still present but small as seen today he will likely need thoracentesis evaluation by IR attending.  Patient returned to the floor.  Images available for review under imaging section of Epic.  Lynnette Caffey, PA-C

## 2021-09-24 ENCOUNTER — Inpatient Hospital Stay (HOSPITAL_COMMUNITY): Payer: Medicaid Other

## 2021-09-24 DIAGNOSIS — J189 Pneumonia, unspecified organism: Secondary | ICD-10-CM | POA: Diagnosis not present

## 2021-09-24 LAB — COMPREHENSIVE METABOLIC PANEL
ALT: 26 U/L (ref 0–44)
AST: 26 U/L (ref 15–41)
Albumin: 2 g/dL — ABNORMAL LOW (ref 3.5–5.0)
Alkaline Phosphatase: 106 U/L (ref 38–126)
Anion gap: 6 (ref 5–15)
BUN: 12 mg/dL (ref 6–20)
CO2: 26 mmol/L (ref 22–32)
Calcium: 8.2 mg/dL — ABNORMAL LOW (ref 8.9–10.3)
Chloride: 107 mmol/L (ref 98–111)
Creatinine, Ser: 0.55 mg/dL — ABNORMAL LOW (ref 0.61–1.24)
GFR, Estimated: >60 mL/min (ref >60)
Glucose, Bld: 90 mg/dL (ref 70–99)
Potassium: 2.9 mmol/L — ABNORMAL LOW (ref 3.5–5.1)
Sodium: 139 mmol/L (ref 135–145)
Total Bilirubin: 0.8 mg/dL (ref 0.3–1.2)
Total Protein: 5.8 g/dL — ABNORMAL LOW (ref 6.5–8.1)

## 2021-09-24 LAB — GLUCOSE, CAPILLARY
Glucose-Capillary: 115 mg/dL — ABNORMAL HIGH (ref 70–99)
Glucose-Capillary: 117 mg/dL — ABNORMAL HIGH (ref 70–99)
Glucose-Capillary: 179 mg/dL — ABNORMAL HIGH (ref 70–99)
Glucose-Capillary: 186 mg/dL — ABNORMAL HIGH (ref 70–99)
Glucose-Capillary: 260 mg/dL — ABNORMAL HIGH (ref 70–99)
Glucose-Capillary: 82 mg/dL (ref 70–99)

## 2021-09-24 LAB — MAGNESIUM: Magnesium: 1.3 mg/dL — ABNORMAL LOW (ref 1.7–2.4)

## 2021-09-24 LAB — CBC
HCT: 28.6 % — ABNORMAL LOW (ref 39.0–52.0)
Hemoglobin: 9.9 g/dL — ABNORMAL LOW (ref 13.0–17.0)
MCH: 33.1 pg (ref 26.0–34.0)
MCHC: 34.6 g/dL (ref 30.0–36.0)
MCV: 95.7 fL (ref 80.0–100.0)
Platelets: 120 10*3/uL — ABNORMAL LOW (ref 150–400)
RBC: 2.99 MIL/uL — ABNORMAL LOW (ref 4.22–5.81)
RDW: 13.8 % (ref 11.5–15.5)
WBC: 6.2 10*3/uL (ref 4.0–10.5)
nRBC: 0 % (ref 0.0–0.2)

## 2021-09-24 LAB — URINE CULTURE: Culture: NO GROWTH

## 2021-09-24 LAB — PHOSPHORUS: Phosphorus: 3.2 mg/dL (ref 2.5–4.6)

## 2021-09-24 MED ORDER — POTASSIUM CHLORIDE 10 MEQ/100ML IV SOLN
10.0000 meq | INTRAVENOUS | Status: AC
  Administered 2021-09-24 (×2): 10 meq via INTRAVENOUS
  Filled 2021-09-24 (×2): qty 100

## 2021-09-24 MED ORDER — VITAL HIGH PROTEIN PO LIQD: 1000.0000 mL | ORAL | Status: DC

## 2021-09-24 MED ORDER — PROSOURCE TF PO LIQD
45.0000 mL | Freq: Two times a day (BID) | ORAL | Status: DC
  Administered 2021-09-24 – 2021-09-27 (×7): 45 mL
  Filled 2021-09-24 (×9): qty 45

## 2021-09-24 MED ORDER — POTASSIUM CHLORIDE 10 MEQ/100ML IV SOLN: 10.0000 meq | INTRAVENOUS | Status: DC

## 2021-09-24 MED ORDER — OSMOLITE 1.5 CAL PO LIQD
1000.0000 mL | ORAL | Status: DC
  Administered 2021-09-24: 1000 mL
  Filled 2021-09-24 (×3): qty 1000

## 2021-09-24 MED ORDER — FREE WATER
150.0000 mL | Status: DC
  Administered 2021-09-24 – 2021-09-27 (×16): 150 mL

## 2021-09-24 NOTE — Progress Notes (Signed)
PROGRESS NOTE    Antonio [name]  IFO:[id number] DOB: 29-Mar-1971 DOA: 09/22/2021 PCP: Gabriel Cirri, DO   Brief Narrative:  Antonio [name] is a cachectic and chronically ill appearing 51 y.o. 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  He was initially planned for IV antibiotics through 08/13/2021.    In the interim, likely due to noncompliance with antibiotic therapy, patient's respiratory status and clinical condition deteriorated with worsening shortness of breath fatigue and pleuritic pain who ultimately presented to our facility on 09/22/2021 with imaging and work-up consistent with ongoing necrotizing pneumonia.  Assessment & Plan:   Principal Problem:   Multifocal pneumonia Active Problems:   MDD (major depressive disorder), recurrent episode, moderate (HCC)   Polysubstance abuse (HCC)   Uncontrolled type 2 diabetes mellitus with hyperglycemia, without long-term current use of insulin (HCC)   Pleural effusion, left   Protein-calorie malnutrition, severe (HCC)   Pressure injury of skin  Severe sepsis secondary to necrotizing pneumonia, POA Concurrent pleural effusion/possible aspiration Concurrent acute hypoxic respiratory failure  -Continue broad-spectrum IV antibiotics including cefepime, vancomycin -Sepsis criteria at intake including tachycardia, tachypnea with notable source of infection -IR requested in consult at intake for possible evaluation of parapneumonic fluid for possible drainage and culture, currently recommending repeat imaging in 2 to 3 days for further evaluation of fluid if  not clinically improving versus thoracentesis in the interim pending further evaluation. -Cultures currently pending, previous hospitalization showed MRSA and Klebsiella positive cultures at that time, in-house cultures are pending -Continue to wean oxygen as tolerated -Speech following for dysphagia - MBS pending 6/19   Uncontrolled type II DM with hyperglycemia - A1c 12.5% -Continue sliding scale insulin, hypoglycemic protocol -Likely liberalize diet in the next 24 to 48 hours if safe to take p.o. given below   Dysphagia  -Profoundly failed speech evaluation today at bedside, planned modified barium study in the next 24 hours - NPO for now, consult SLP - MBS pending   Severe protein-calorie malnutrition  - Cachectic with BMI 14  - Remains unsafe to feed at this point given above profound dysphagia -We will likely liberalize diet once patient safe to eat, appreciate dietary insight and recommendations   Polysubstance abuse, ongoing -Patient's UDS positive for opiates and amphetamines during his last 2 hospitalizations; at [facility] in April he tested positive for Dilaudid, oxymorphone, oxycodone and morphine -Patient reports recent abstinence of narcotics since May 14  -Follow closely for signs and symptoms of withdrawal  Profound noncompliance -Patient attempting to take p.o. today despite being ordered n.p.o. at bedside educated on the risks of aspiration -Recently left previous hospital despite profound and advanced infection process for cigarettes -offered nicotine patch which patient adamantly refused this morning -We discussed that should patient continue to be noncompliant with his medical care he is only likely to place himself at high risk for morbidity and mortality and ultimately death should he continue to attempt to take p.o. inappropriately given his high risk for aspiration   DVT prophylaxis: Lovenox  Code Status: Full  Family Communication: None present  Status is:  Inpatient  Dispo: The patient is from: Home  Anticipated d/c is to: Home              Anticipated d/c date is: To be determined              Patient currently not medically stable for discharge  Consultants:  IR  Procedures:  None  Antimicrobials:  Cefepime, vancomycin  Subjective: No acute issues or events overnight  Objective: Vitals:   09/23/21 0613 09/23/21 1650 09/23/21 1937 09/24/21 0100  BP: 115/80 108/81 99/70 92/68   Pulse: (!) 108 96 96 91  Resp: 16 (!) 22 (!) 30 (!) 27  Temp: 98.4 F (36.9 C) 98.4 F (36.9 C) 98.8 F (37.1 C) 98 F (36.7 C)  TempSrc: Oral Oral Oral Oral  SpO2: 90% 98% 97% 98%  Weight:      Height:        Intake/Output Summary (Last 24 hours) at 09/24/2021 0824 Last data filed at 09/24/2021 0351 Gross per 24 hour  Intake --  Output 680 ml  Net -680 ml    Filed Weights   09/22/21 1855 09/23/21 0039  Weight: 45.4 kg 43.4 kg    Examination:  General: Cachectic gentleman resting comfortably in bed, no acute distress; diffuse muscle wasting noted Lungs: Rhonchorous breath sounds bilaterally, PMI left lower lobe Heart: Tachycardic, regular rhythm without murmur rubs or gallops Abdomen: Gaunt, without guarding or rebound. Extremities: Gaunt, without cyanosis, clubbing, edema Skin: Warm dry, numerous tattoos throughout without overt ulcerations or wound  Data Reviewed: I have personally reviewed following labs and imaging studies  CBC: Recent Labs  Lab 09/22/21 1643 09/22/21 1758 09/23/21 0214 09/24/21 0314  WBC 9.8  --  9.0 6.2  NEUTROABS 7.5  --   --   --   HGB 11.7* 13.3 10.8* 9.9*  HCT 35.0* 39.0 32.5* 28.6*  MCV 98.3  --  97.3 95.7  PLT 174  --  158 120*    Basic Metabolic Panel: Recent Labs  Lab 09/22/21 1643 09/22/21 1758 09/23/21 0214 09/24/21 0314  NA 141 142 140 139  K 4.3 4.5 3.5 2.9*  CL 106  --  107 107  CO2 20*  --  23 26  GLUCOSE 528*  --  251* 90  BUN 28*  --  19 12  CREATININE 0.97  --   0.77 0.55*  CALCIUM 9.1  --  8.9 8.2*  MG  --   --  1.7  --   PHOS  --   --  2.1*  --     GFR: Estimated Creatinine Clearance: 68.6 mL/min (A) (by C-G formula based on SCr of 0.55 mg/dL (L)). Liver Function Tests: Recent Labs  Lab 09/22/21 1643 09/23/21 0214 09/24/21 0314  AST 25 26 26   ALT 40 35 26  ALKPHOS 156* 130* 106  BILITOT 1.5* 0.7 0.8  PROT 7.3 6.8 5.8*  ALBUMIN 2.7* 2.5* 2.0*    No results for input(s): "LIPASE", "AMYLASE" in the last 168 hours. No results for input(s): "AMMONIA" in the last 168 hours. Coagulation Profile: Recent Labs  Lab 09/22/21 1643  INR 1.3*    Cardiac Enzymes: Recent Labs  Lab 09/22/21 1643  CKTOTAL 16*    BNP (last 3 results) No results for input(s): "PROBNP" in the last 8760 hours. HbA1C: No results for input(s): "HGBA1C" in the last 72 hours. CBG: Recent Labs  Lab 09/23/21 1642 09/23/21 2002 09/24/21 0022 09/24/21 0411 09/24/21 0754  GLUCAP 261* 114* 115* 82 260*    Lipid Profile: No results for input(s): "  CHOL", "HDL", "LDLCALC", "TRIG", "CHOLHDL", "LDLDIRECT" in the last 72 hours. Thyroid Function Tests: No results for input(s): "TSH", "T4TOTAL", "FREET4", "T3FREE", "THYROIDAB" in the last 72 hours. Anemia Panel: No results for input(s): "VITAMINB12", "FOLATE", "FERRITIN", "TIBC", "IRON", "RETICCTPCT" in the last 72 hours. Sepsis Labs: Recent Labs  Lab 09/22/21 1643 09/23/21 0214 09/23/21 0737  PROCALCITON  --  2.09  2.08  --   LATICACIDVEN 1.3 2.3* 1.0     Recent Results (from the past 240 hour(s))  Culture, blood (Routine x 2)     Status: None (Preliminary result)   Collection Time: 09/22/21  4:43 PM   Specimen: BLOOD RIGHT ARM  Result Value Ref Range Status   Specimen Description BLOOD RIGHT ARM  Final   Special Requests   Final    BOTTLES DRAWN AEROBIC AND ANAEROBIC Blood Culture adequate volume   Culture   Final    NO GROWTH < 24 HOURS Performed at [facility], [address]  [place], [place] [address]    Report Status PENDING  Incomplete  Culture, blood (Routine x 2)     Status: None (Preliminary result)   Collection Time: 09/22/21  4:48 PM   Specimen: BLOOD  Result Value Ref Range Status   Specimen Description BLOOD SITE NOT SPECIFIED  Final   Special Requests   Final    BOTTLES DRAWN AEROBIC AND ANAEROBIC Blood Culture adequate volume   Culture   Final    NO GROWTH < 24 HOURS Performed at [facility], [address] [place], [place] [address]    Report Status PENDING  Incomplete  Resp Panel by RT-PCR (Flu A&B, Covid) Anterior Nasal Swab     Status: None   Collection Time: 09/22/21  4:55 PM   Specimen: Anterior Nasal Swab  Result Value Ref Range Status   SARS Coronavirus 2 by RT PCR NEGATIVE NEGATIVE Final    Comment: (NOTE) SARS-CoV-2 target nucleic acids are NOT DETECTED.  The SARS-CoV-2 RNA is generally detectable in upper respiratory specimens during the acute phase of infection. The lowest concentration of SARS-CoV-2 viral copies this assay can detect is 138 copies/mL. A negative result does not preclude SARS-Cov-2 infection and should not be used as the sole basis for treatment or other patient management decisions. A negative result may occur with  improper specimen collection/handling, submission of specimen other than nasopharyngeal swab, presence of viral mutation(s) within the areas targeted by this assay, and inadequate number of viral copies(<138 copies/mL). A negative result must be combined with clinical observations, patient history, and epidemiological information. The expected result is Negative.  Fact Sheet for Patients:  BloggerCourse.com  Fact Sheet for Healthcare Providers:  SeriousBroker.it  This test is no t yet approved or cleared by the [place] FDA and  has been authorized for detection and/or diagnosis of SARS-CoV-2 by FDA under an Emergency Use Authorization  (EUA). This EUA will remain  in effect (meaning this test can be used) for the duration of the COVID-19 declaration under Section 564(b)(1) of the Act, 21 U.S.C.section 360bbb-3(b)(1), unless the authorization is terminated  or revoked sooner.       Influenza A by PCR NEGATIVE NEGATIVE Final   Influenza B by PCR NEGATIVE NEGATIVE Final    Comment: (NOTE) The Xpert Xpress SARS-CoV-2/FLU/RSV plus assay is intended as an aid in the diagnosis of influenza from Nasopharyngeal swab specimens and should not be used as a sole basis for treatment. Nasal washings and aspirates are unacceptable for  Xpert Xpress SARS-CoV-2/FLU/RSV testing.  Fact Sheet for Patients: BloggerCourse.com  Fact Sheet for Healthcare Providers: SeriousBroker.it  This test is not yet approved or cleared by the [place] FDA and has been authorized for detection and/or diagnosis of SARS-CoV-2 by FDA under an Emergency Use Authorization (EUA). This EUA will remain in effect (meaning this test can be used) for the duration of the COVID-19 declaration under Section 564(b)(1) of the Act, 21 U.S.C. section 360bbb-3(b)(1), unless the authorization is terminated or revoked.  Performed at [facility], [address], [place], [place] [address]   MRSA Next Gen by PCR, Nasal     Status: Abnormal   Collection Time: 09/23/21  1:00 AM  Result Value Ref Range Status   MRSA by PCR Next Gen DETECTED (A) NOT DETECTED Final    Comment: RESULT CALLED TO, READ BACK BY AND VERIFIED WITH: RN SIERRA DONNELL 09/23/21@2 :25 BY TW (NOTE) The GeneXpert MRSA Assay (FDA approved for NASAL specimens only), is one component of a comprehensive MRSA colonization surveillance program. It is not intended to diagnose MRSA infection nor to guide or monitor treatment for MRSA infections. Test performance is not FDA approved in patients less than 82 years old. Performed at [facility], [address], [place], [place] [address]          Radiology Studies: [place] CHEST (PLEURAL EFFUSION)  Result Date: 09/23/2021 CLINICAL DATA:  Left pleural effusion. EXAM: CHEST ULTRASOUND COMPARISON:  CT 09/22/2021 FINDINGS: Images of the left lung base demonstrate a small amount of left pleural fluid as seen on recent CT. IMPRESSION: Small left pleural effusion. Electronically Signed   By: Elberta Fortis M.D.   On: 09/23/2021 12:24   CT Angio Chest PE W and/or Wo Contrast  Result Date: 09/22/2021 CLINICAL DATA:  Pulmonary embolism suspected. High probability of septic emboli. Possible PE. EXAM: CT ANGIOGRAPHY CHEST WITH CONTRAST TECHNIQUE: Multidetector CT imaging of the chest was performed using the standard protocol during bolus administration of intravenous contrast. Multiplanar CT image reconstructions and MIPs were obtained to evaluate the vascular anatomy. RADIATION DOSE REDUCTION: This exam was performed according to the departmental dose-optimization program which includes automated exposure control, adjustment of the mA and/or kV according to patient size and/or use of iterative reconstruction technique. CONTRAST:  53mL OMNIPAQUE IOHEXOL 350 MG/ML SOLN COMPARISON:  One-view chest x-ray 09/22/2021.  CTA chest 10/31/2019 FINDINGS: Cardiovascular: Heart size is normal. Aorta and great vessel origins are within normal limits. Pulmonary artery opacification is excellent. No focal filling defects are present to suggest pulmonary embolus. Mediastinum/Nodes: No enlarged mediastinal, hilar, or axillary lymph nodes. Thyroid gland, trachea, and esophagus demonstrate no significant findings. Lungs/Pleura: Extensive bilateral patchy airspace disease is present. This most prominent the left lower lobe. A new cavitary lesion is present a medial left lower lobe measuring 18 x 18 mm. Moderate left pleural effusion is present with associated atelectasis. Previously seen right middle lobe airspace  disease has cleared. Cavitary lesion in the right upper lobe is now fluid-filled, overall stable in size. Upper Abdomen: Splenomegaly noted. Visualized upper abdomen is otherwise within normal limits. Musculoskeletal: No chest wall abnormality. No acute or significant osseous findings. Review of the MIP images confirms the above findings. IMPRESSION: 1. No pulmonary embolus. 2. Extensive bilateral patchy airspace disease consistent with multifocal pneumonia. 3. New cavitary lesion in the medial left lower lobe. 4. Moderate left pleural effusion with associated atelectasis. 5. Cavitary lesion in the right upper lobe is now fluid-filled, overall  stable in size. 6. Splenomegaly. Electronically Signed   By: Marin Robertshristopher  Mattern M.D.   On: 09/22/2021 19:56   DG Chest 1 View  Result Date: 09/22/2021 CLINICAL DATA:  Pneumonia EXAM: CHEST  1 VIEW COMPARISON:  09/19/2021 FINDINGS: Cardiac size is within normal limits. Low position of diaphragms may suggest COPD. New patchy infiltrates are noted in the left parahilar region and left lower lung fields. There is no pleural effusion or pneumothorax. IMPRESSION: There is new patchy infiltrate in the left parahilar region and left lower lung fields suggesting pneumonia. Electronically Signed   By: Ernie AvenaPalani  Rathinasamy M.D.   On: 09/22/2021 19:16        Scheduled Meds:  Chlorhexidine Gluconate Cloth  6 each Topical Q0600   enoxaparin (LOVENOX) injection  40 mg Subcutaneous Q24H   insulin aspart  0-9 Units Subcutaneous Q4H   insulin glargine-yfgn  10 Units Subcutaneous QHS   mupirocin ointment  1 Application Nasal BID   sodium chloride flush  3 mL Intravenous Q12H   Continuous Infusions:  ceFEPime (MAXIPIME) IV 2 g (09/23/21 2124)   vancomycin 750 mg (09/23/21 1738)     LOS: 2 days   Time spent: 45min  Azucena FallenWilliam C Anthonette Lesage, DO [facility]  If 7PM-7AM, please contact night-coverage [email or web address]  09/24/2021, 8:24 AM

## 2021-09-24 NOTE — Progress Notes (Addendum)
Initial Nutrition Assessment  DOCUMENTATION CODES:   Underweight, Severe malnutrition in context of chronic illness  INTERVENTION:   -RD will follow for diet advancement and add supplements as appropriate -Once tube is placed and placement is verified:   Initiate Osmolite 1.5 @ 20 ml/hr and increase by 10 ml every 12 hours to goal rate of 50 ml/hr.   45 ml Prosource TF BID.    150 ml free water flush every 4 hours  Tube feeding regimen provides 1880 kcal (100% of needs), 97 grams of protein, and 914 ml of H2O. Total free water: 1814 ml daily  -Monitor Mg, K, and Phos daily and replete as needed secondary to high refeeding risk  NUTRITION DIAGNOSIS:   Severe Malnutrition related to chronic illness (chronic pancreatitis) as evidenced by severe fat depletion, severe muscle depletion.  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  Pt admitted with necrotizing pneumonia, pleural effusion, and acute hypoxic respiratory failure.   6/19- s/p BSE- recommend continue dysphagia 3 diet with thin liquids  Reviewed I/O's: -680 ml x 24 hours and -186 ml since admission  UOP: 680 ml x 24 hours   Pt unavailable at time of visit. Attempted to speak with pt via call to hospital room phone, however, unable to reach.   Per H&P, pt has been hospitalized at different facilities for pneumonia since 07/2021, however, left AMA prior to completing treatment.   Per SLP, pt is difficult to understand. Plan for MBSS in the next few  days.   Per IR notes, possible plan for thoracentesis in 2-3 days after repeat imaging. Thoracentesis not obtained today due to small fluid size.   Reviewed wt hx; noted distant history of weight loss.   Pt currently on a clear liquid diet. Case discussed with Dr. Natale Milch via secure chat to see if diet can be advanced. Pt is now NPO.  ADDNEDUM: Case discussed with cortrak team. Plan for cortrak tube placement. MD ordered consult to initiate TF.   Medications reviewed and include potassium chloride.   Lab Results  Component Value Date   HGBA1C 12.5 (H) 09/15/2020   PTA DM medications are 750 mg metformin BID.   Labs reviewed: K: 2.9 (on IV supplementation), CBGS: 82-260 (inpatient orders for glycemic control are 0-9 units insulin aspart every 4 hours and 10 units insulin glargine-yfgn daily).    Nutrition-Focused Physical Exam:  Flowsheet Row Most Recent Value  Orbital Region Severe depletion  Upper Arm Region Severe depletion  Thoracic and Lumbar Region Severe depletion  Buccal Region Severe depletion  Temple Region Severe depletion  Clavicle Bone Region Severe depletion  Clavicle and Acromion Bone Region Severe depletion  Scapular Bone Region Severe depletion  Dorsal Hand Severe depletion  Patellar Region Severe depletion  Anterior Thigh Region Severe depletion  Posterior Calf Region Severe depletion  Edema (RD Assessment) None  Hair Reviewed  Eyes Reviewed  Mouth Reviewed  Skin Reviewed  Nails Reviewed        Diet Order:   Diet Order  Diet NPO time specified  Diet effective now                  EDUCATION NEEDS:   No education needs have been identified at this time  Skin:  Skin Assessment: Skin Integrity Issues: Skin Integrity Issues:: Stage II Stage II: perineum  Last BM:  Unknown  Height:   Ht Readings from Last 1 Encounters:  09/23/21 5\' 9"  (1.753 m)    Weight:   Wt Readings from Last 1 Encounters:  09/23/21 43.4 kg     Ideal Body Weight:  72.7 kg  BMI:  Body mass index is 14.13 kg/m.  Estimated Nutritional Needs:   Kcal:  1750-1950  Protein:  90-105 grams  Fluid:  > 1.7 L    09/25/21, RD, LDN, CDCES Registered Dietitian II Certified Diabetes Care and Education Specialist Please refer to [facility] for RD and/or RD on-call/weekend/after hours pager

## 2021-09-24 NOTE — Procedures (Signed)
Cortrak ? ?Person Inserting Tube:  Inessa Wardrop D, RD ?Tube Type:  Cortrak - 43 inches ?Tube Size:  10 ?Tube Location:  Left nare ?Secured by: Bridle ?Technique Used to Measure Tube Placement:  Marking at nare/corner of mouth ?Cortrak Secured At:  75 cm ? ?Cortrak Tube Team Note: ? ?Consult received to place a Cortrak feeding tube.  ? ?X-ray is required, abdominal x-ray has been ordered by the Cortrak team. Please confirm tube placement before using the Cortrak tube.  ? ?If the tube becomes dislodged please keep the tube and contact the Cortrak team at [email or web address] (password [email or web address]) for replacement.  ?If after hours and replacement cannot be delayed, place a NG tube and confirm placement with an abdominal x-ray.  ? ? ?Zeva Leber, RD, LDN ?Clinical Dietitian ?RD pager # available in AMION  ?After hours/weekend pager # available in AMION ? ?

## 2021-09-24 NOTE — Progress Notes (Signed)
RN received verbal order from MD, Cortrak in correct position and ok to use.

## 2021-09-24 NOTE — Progress Notes (Signed)
Speech Language Pathology Treatment: Dysphagia  Patient Details Name: Antonio [name] MRN: [id number] DOB: 02-05-1971 Today's Date: 09/24/2021 Time: 1020-1030 SLP Time Calculation (min) (ACUTE ONLY): 10 min  Assessment / Plan / Recommendation Clinical Impression  F/u after yesterday's clinical swallow eval to ensure pt's willingness to participate in MBS today. He agreed.  He drank some thin liquids during my visit, and despite cues for rate/quantity/positioning, he had explosive coughing with each sip. His voice sounds quite hyper-nasal, and the degree of his dysarthria raises concerns for etiology of dysphagia.    Hold POs pending MBS today - scheduled for noon.    HPI HPI: Antonio [name] is a chronically very ill 51 y.o. homeless male admitted with necrotizing pna, dysphagia.  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 Pt was noted to choke with liquids in the ED, hence swallow evaluation ordered.      SLP Plan  MBS      Recommendations for follow up therapy are one component of a multi-disciplinary discharge planning process, led by the attending physician.  Recommendations may be updated based on patient status, additional functional criteria and insurance authorization.    Recommendations  Medication Administration: Whole meds with puree                Oral Care Recommendations: Oral care BID SLP Visit Diagnosis: Dysphagia, unspecified (R13.10) Plan: MBS        Antonio Michelli L. [name], MA CCC/SLP Clinical Specialist - Acute Care SLP Acute Rehabilitation Services Office number [phone]    Antonio [name]  09/24/2021, 10:39 AM

## 2021-09-24 NOTE — Progress Notes (Signed)
Modified Barium Swallow Progress Note  Patient Details  Name: Antonio [name] MRN: [id number] Date of Birth: 07/19/1970  Today's Date: 09/24/2021  Modified Barium Swallow completed.  Full report located under Chart Review in the Imaging Section.  Brief recommendations include the following:  Clinical Impression  Pt presents with a moderate oropharyngeal dysphagia, etiology undetermined, marked by decreased oral control of bolus, significant delays in initiation of the pharyngeal swallow, immediate aspiration of thin and nectar thick liquids before the swallow trigger, vallecular/pyrifom residue (some of which spilled into the airway after the swallow response). Laryngeal vestibule closure was complete, but the mistiming of the swallow led to aspiration both before and after laryngeal closure.  Purees, mechanical soft foods, and honey-thick liquids appeared to be the consistencies least likely to be aspirated, but they still pose a risk given residue that remains in the pharynx. Pyriform spaces seem unusually small.  Discussed plan with Dr. Natale Milch- he has ordered cortrak and will continue NPO with ice chips for now.   Swallow Evaluation Recommendations       SLP Diet Recommendations: NPO; allow occasional ice chips                       Oral Care Recommendations: Oral care QID      Antonio Mccorvey L. [name], MA CCC/SLP Clinical Specialist - Acute Care SLP Acute Rehabilitation Services Office number [phone]   Antonio [name] 09/24/2021,2:43 PM

## 2021-09-25 DIAGNOSIS — J189 Pneumonia, unspecified organism: Secondary | ICD-10-CM | POA: Diagnosis not present

## 2021-09-25 LAB — COMPREHENSIVE METABOLIC PANEL
ALT: 23 U/L (ref 0–44)
AST: 29 U/L (ref 15–41)
Albumin: 2 g/dL — ABNORMAL LOW (ref 3.5–5.0)
Alkaline Phosphatase: 97 U/L (ref 38–126)
Anion gap: 7 (ref 5–15)
BUN: 12 mg/dL (ref 6–20)
CO2: 25 mmol/L (ref 22–32)
Calcium: 8 mg/dL — ABNORMAL LOW (ref 8.9–10.3)
Chloride: 102 mmol/L (ref 98–111)
Creatinine, Ser: 0.45 mg/dL — ABNORMAL LOW (ref 0.61–1.24)
GFR, Estimated: >60 mL/min (ref >60)
Glucose, Bld: 133 mg/dL — ABNORMAL HIGH (ref 70–99)
Potassium: 3.2 mmol/L — ABNORMAL LOW (ref 3.5–5.1)
Sodium: 134 mmol/L — ABNORMAL LOW (ref 135–145)
Total Bilirubin: 0.8 mg/dL (ref 0.3–1.2)
Total Protein: 6 g/dL — ABNORMAL LOW (ref 6.5–8.1)

## 2021-09-25 LAB — CBC
HCT: 29.3 % — ABNORMAL LOW (ref 39.0–52.0)
Hemoglobin: 10.1 g/dL — ABNORMAL LOW (ref 13.0–17.0)
MCH: 32.7 pg (ref 26.0–34.0)
MCHC: 34.5 g/dL (ref 30.0–36.0)
MCV: 94.8 fL (ref 80.0–100.0)
Platelets: 125 10*3/uL — ABNORMAL LOW (ref 150–400)
RBC: 3.09 MIL/uL — ABNORMAL LOW (ref 4.22–5.81)
RDW: 13.5 % (ref 11.5–15.5)
WBC: 4.8 10*3/uL (ref 4.0–10.5)
nRBC: 0 % (ref 0.0–0.2)

## 2021-09-25 LAB — PHOSPHORUS
Phosphorus: 3.1 mg/dL (ref 2.5–4.6)
Phosphorus: 2.6 mg/dL (ref 2.5–4.6)

## 2021-09-25 LAB — GLUCOSE, CAPILLARY
Glucose-Capillary: 126 mg/dL — ABNORMAL HIGH (ref 70–99)
Glucose-Capillary: 136 mg/dL — ABNORMAL HIGH (ref 70–99)
Glucose-Capillary: 159 mg/dL — ABNORMAL HIGH (ref 70–99)
Glucose-Capillary: 181 mg/dL — ABNORMAL HIGH (ref 70–99)
Glucose-Capillary: 189 mg/dL — ABNORMAL HIGH (ref 70–99)
Glucose-Capillary: 243 mg/dL — ABNORMAL HIGH (ref 70–99)

## 2021-09-25 LAB — LEGIONELLA PNEUMOPHILA SEROGP 1 UR AG: L. pneumophila Serogp 1 Ur Ag: NEGATIVE

## 2021-09-25 LAB — MAGNESIUM
Magnesium: 1.5 mg/dL — ABNORMAL LOW (ref 1.7–2.4)
Magnesium: 2.2 mg/dL (ref 1.7–2.4)

## 2021-09-25 MED ORDER — IBUPROFEN 600 MG PO TABS
600.0000 mg | ORAL_TABLET | ORAL | Status: DC | PRN
  Administered 2021-09-25 – 2021-10-08 (×9): 600 mg via ORAL
  Filled 2021-09-25 (×9): qty 1

## 2021-09-25 MED ORDER — POTASSIUM CHLORIDE 20 MEQ PO PACK
40.0000 meq | PACK | ORAL | Status: AC
  Administered 2021-09-25 (×2): 40 meq via ORAL
  Filled 2021-09-25 (×2): qty 2

## 2021-09-25 MED ORDER — MAGNESIUM SULFATE 2 GM/50ML IV SOLN
2.0000 g | Freq: Once | INTRAVENOUS | Status: AC
  Administered 2021-09-25: 2 g via INTRAVENOUS
  Filled 2021-09-25: qty 50

## 2021-09-25 NOTE — Plan of Care (Signed)
  Problem: Education: Goal: Ability to describe self-care measures that may prevent or decrease complications (Diabetes Survival Skills Education) will improve Outcome: Progressing Goal: Individualized Educational Video(s) Outcome: Progressing   Problem: Coping: Goal: Ability to adjust to condition or change in health will improve Outcome: Progressing   Problem: Fluid Volume: Goal: Ability to maintain a balanced intake and output will improve Outcome: Progressing   Problem: Health Behavior/Discharge Planning: Goal: Ability to identify and utilize available resources and services will improve Outcome: Progressing Goal: Ability to manage health-related needs will improve Outcome: Progressing   Problem: Metabolic: Goal: Ability to maintain appropriate glucose levels will improve Outcome: Progressing   Problem: Nutritional: Goal: Maintenance of adequate nutrition will improve Outcome: Progressing Goal: Progress toward achieving an optimal weight will improve Outcome: Progressing   Problem: Skin Integrity: Goal: Risk for impaired skin integrity will decrease Outcome: Progressing   Problem: Tissue Perfusion: Goal: Adequacy of tissue perfusion will improve Outcome: Progressing   Problem: Activity: Goal: Ability to tolerate increased activity will improve Outcome: Progressing   Problem: Clinical Measurements: Goal: Ability to maintain a body temperature in the normal range will improve Outcome: Progressing   Problem: Respiratory: Goal: Ability to maintain adequate ventilation will improve Outcome: Progressing Goal: Ability to maintain a clear airway will improve Outcome: Progressing   Problem: Education: Goal: Knowledge of General Education information will improve Description: Including pain rating scale, medication(s)/side effects and non-pharmacologic comfort measures Outcome: Progressing   Problem: Health Behavior/Discharge Planning: Goal: Ability to manage  health-related needs will improve Outcome: Progressing   Problem: Clinical Measurements: Goal: Ability to maintain clinical measurements within normal limits will improve Outcome: Progressing Goal: Will remain free from infection Outcome: Progressing Goal: Diagnostic test results will improve Outcome: Progressing Goal: Respiratory complications will improve Outcome: Progressing Goal: Cardiovascular complication will be avoided Outcome: Progressing   Problem: Activity: Goal: Risk for activity intolerance will decrease Outcome: Progressing   Problem: Nutrition: Goal: Adequate nutrition will be maintained Outcome: Progressing   Problem: Coping: Goal: Level of anxiety will decrease Outcome: Progressing   Problem: Elimination: Goal: Will not experience complications related to bowel motility Outcome: Progressing Goal: Will not experience complications related to urinary retention Outcome: Progressing   Problem: Pain Managment: Goal: General experience of comfort will improve Outcome: Progressing   Problem: Safety: Goal: Ability to remain free from injury will improve Outcome: Progressing   Problem: Skin Integrity: Goal: Risk for impaired skin integrity will decrease Outcome: Progressing   

## 2021-09-25 NOTE — Progress Notes (Signed)
Speech Language Pathology Treatment: Dysphagia  Patient Details Name: Antonio [name] MRN: [id number] DOB: July 28, 1970 Today's Date: 09/25/2021 Time: 3734-2876 SLP Time Calculation (min) (ACUTE ONLY): 46 min  Assessment / Plan / Recommendation Clinical Impression  Pt seen for therapeutic interventions for swallowing after yesterday MBS. Pt now NPO with Cortrak. Pt is restless, uncomfortable, impulsive and distractible. SLP provided visual feedback of MBS to show pt problem of spillage of boluses to his throat before he swallows. Pt unable to attend. SLP offered total assisted feeding of nectar thick water and pudding with verbal cues to reduce prolonged oral phase and increase awareness of swallow initiation. Pt able to identify his own swallow while touching his throat. WIth a verbal cue pt swallowed pudding with a 5 second or less interval x5. Over the course of the session, pts attention and ability to participate waned with increasing impulsivity and begging for more PO. Prior MBS on 2/23 showed a similar impairment, though mentation seemed a bit better from documentation. Pt was unable to fully attend to therapist for attempts at compensatory strategies this admission and pt is definitely not carrying over the chin tuck strategy previously recommended. Overall, pt is very unlikely to adhere to strategies given deterioration of mentation to an almost dementia-like state with pt focused only on immediate wants and needs with significant impulsivity and poor safety awareness or capacity for memory. However, keeping him NPO is causing him anxiety and suffering. Would recommend initiating a mechanical soft diet with honey thick liquids with ongoing efforts at therapy to decrease pts anxiety over food insecurity during sessions.     HPI HPI: Antonio [name] is a chronically very ill 51 y.o. homeless male admitted with necrotizing pna, dysphagia.  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 Pt was noted to choke with liquids in the ED, hence swallow evaluation ordered. 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 Negative for malignancy." and sinus contents resulting mild chronic inflammation.   MBS on 05/11/21 "Patient presents with a primary pharyngeal dysphagia at high risk for aspiration of liquids. Chin tuck was successful in eliminating aspiration of thin and nectar thick liquids. Oral swallow characterized by decreased bolus manipulation and mastication question secondary to large bolus and edentulous state with solids. Patient had premature spillage of thin liquids, nectar liquids, and honey thick liquids to the level of the piriform sinus. Chin tuck did decrease level of spillage to vallecular space with nectar thick liquid trials."      SLP Plan  Continue with current plan of care      Recommendations for follow up therapy are one component of a multi-disciplinary discharge planning process, led by the attending physician.  Recommendations may be updated based on patient status, additional functional criteria and insurance authorization.    Recommendations  Diet recommendations: Other(comment) (see impression) Medication Administration: Via alternative means Supervision: Full supervision/cueing for compensatory strategies                Follow Up Recommendations: Other (comment) Assistance recommended at discharge: Frequent or constant Supervision/Assistance SLP Visit Diagnosis: Dysphagia, oropharyngeal phase (R13.12) Plan: Continue with current plan  of care  Antonio [name], [name]  09/25/2021, 10:15 AM

## 2021-09-25 NOTE — Progress Notes (Signed)
PROGRESS NOTE    Antonio CockayneRoger [name]  WUJ:[id number] DOB: 09/07/1970 DOA: 09/22/2021 PCP: Antonio [name], Antonio [name]   Brief Narrative:  Antonio CockayneRoger [name] is a cachectic and chronically ill appearing 51 y.o. 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  He was initially planned for IV antibiotics through 08/13/2021.    In the interim, likely due to noncompliance with antibiotic therapy, patient's respiratory status and clinical condition deteriorated with worsening shortness of breath fatigue and pleuritic pain who ultimately presented to our facility on 09/22/2021 with imaging and work-up consistent with ongoing necrotizing pneumonia.  Assessment & Plan:   Principal Problem:   Multifocal pneumonia Active Problems:   MDD (major depressive disorder), recurrent episode, moderate (HCC)   Polysubstance abuse (HCC)   Uncontrolled type 2 diabetes mellitus with hyperglycemia, without long-term current use of insulin (HCC)   Pleural effusion, left   Protein-calorie malnutrition, severe (HCC)   Pressure injury of skin  Severe sepsis secondary to necrotizing pneumonia, POA Concurrent pleural effusion/possible aspiration Concurrent acute hypoxic respiratory failure  -Continue broad-spectrum IV antibiotics including cefepime, vancomycin -Sepsis criteria at intake including tachycardia, tachypnea with notable source of infection -IR requested in consult at intake for possible evaluation of parapneumonic fluid for possible drainage and culture, repeat chest x-ray in the morning to follow along, clinically stable if not  improving. -Cultures currently pending, previous hospitalization showed MRSA and Klebsiella positive cultures at that time, in-house cultures are pending -Continue to wean oxygen as tolerated -Speech following for dysphagia -failed MBS, concern for aspiration likely chronic - NG tube currently ongoing as below   Uncontrolled type II DM with hyperglycemia - A1c 12.5% -Continue sliding scale insulin, hypoglycemic protocol -Likely liberalize diet in the next 24 to 48 hours if safe to take p.o. given below   Dysphagia  -Profoundly failed speech evaluation today at bedside, planned modified barium study in the next 24 hours - NPO for now, consult SLP - MBS pending   Severe protein-calorie malnutrition  Electrolyte abnormalities - Cachectic with BMI 14  - Continue NG -defer to dietary for caloric management - K/Mg repleted as appropriate - Phos stable without signs or symptoms of refeeding syndrome   Polysubstance abuse, ongoing -Patient's UDS positive for opiates and amphetamines during his last 2 hospitalizations; [facility] he tested positive for Dilaudid, oxymorphone, oxycodone and morphine -Patient reports recent abstinence of narcotics since May 14  -Follow closely for signs and symptoms of withdrawal  Profound noncompliance -Patient attempting to take p.o. today despite being ordered n.p.o. at bedside educated on the risks of aspiration -Recently left previous hospital despite profound and advanced infection process for cigarettes -offered nicotine patch which patient adamantly refused this morning -We discussed that should patient continue to be noncompliant with his medical care he is only likely to place himself at high risk for morbidity and mortality and ultimately death should he continue to attempt to take p.o. inappropriately given his high risk for aspiration   DVT prophylaxis: Lovenox  Code Status: Full  Family Communication: None present  Status is:  Inpatient  Dispo: The patient is from: Home  Anticipated d/c is to: Home              Anticipated d/c date is: To be determined              Patient currently not medically stable for discharge  Consultants:  IR  Procedures:  None  Antimicrobials:  Cefepime, vancomycin  Subjective: No acute issues or events overnight -patient continues to be agitated with quite labile mood currently demanding diet and fixated on oral intake despite NG tube in place  Objective: Vitals:   09/24/21 2326 09/25/21 0422 09/25/21 0456 09/25/21 0744  BP: 93/80 (!) 89/67  (!) 81/65  Pulse: 89 80  88  Resp: 20 20  19   Temp: 98.9 F (37.2 C) 98.7 F (37.1 C)  98.3 F (36.8 C)  TempSrc: Oral Oral  Oral  SpO2: 95% 95%  95%  Weight:   47.8 kg   Height:        Intake/Output Summary (Last 24 hours) at 09/25/2021 0832 Last data filed at 09/24/2021 2157 Gross per 24 hour  Intake 45 ml  Output 1000 ml  Net -955 ml    Filed Weights   09/22/21 1855 09/23/21 0039 09/25/21 0456  Weight: 45.4 kg 43.4 kg 47.8 kg    Examination:  General: Cachectic appearing gentleman resting in bed no acute distress.  Core track NG tube in left nare clean dry intact Lungs:  Clear to auscultate bilaterally without rhonchi, wheeze, or rales. Heart:  Regular rate and rhythm.  Without murmurs, rubs, or gallops. Abdomen:  Soft, nontender, nondistended.  Without guarding or rebound. Musculoskeletal/extremities: Clear muscle wasting, gaunt appearance of both torso and all 4 limbs.  Ribs/clavicle clearly visible.   Data Reviewed: I have personally reviewed following labs and imaging studies  CBC: Recent Labs  Lab 09/22/21 1643 09/22/21 1758 09/23/21 0214 09/24/21 0314 09/25/21 0511  WBC 9.8  --  9.0 6.2 4.8  NEUTROABS 7.5  --   --   --   --   HGB 11.7* 13.3 10.8* 9.9* 10.1*  HCT 35.0* 39.0 32.5* 28.6* 29.3*  MCV 98.3  --  97.3 95.7 94.8  PLT 174  --  158 120* 125*    Basic Metabolic Panel: Recent  Labs  Lab 09/22/21 1643 09/22/21 1758 09/23/21 0214 09/24/21 0314 09/24/21 1542 09/25/21 0511  NA 141 142 140 139  --  134*  K 4.3 4.5 3.5 2.9*  --  3.2*  CL 106  --  107 107  --  102  CO2 20*  --  23 26  --  25  GLUCOSE 528*  --  251* 90  --  133*  BUN 28*  --  19 12  --  12  CREATININE 0.97  --  0.77 0.55*  --  0.45*  CALCIUM 9.1  --  8.9 8.2*  --  8.0*  MG  --   --  1.7  --  1.3* 1.5*  PHOS  --   --  2.1*  --  3.2 3.1    GFR: Estimated Creatinine Clearance: 75.5 mL/min (A) (by C-G formula based on SCr of 0.45 mg/dL (L)). Liver Function Tests: Recent Labs  Lab 09/22/21 1643 09/23/21 0214 09/24/21 0314 09/25/21 0511  AST 25 26 26 29   ALT 40 35 26 23  ALKPHOS 156* 130* 106 97  BILITOT 1.5* 0.7 0.8 0.8  PROT 7.3 6.8 5.8* 6.0*  ALBUMIN 2.7* 2.5* 2.0* 2.0*    No results for input(s): "LIPASE", "AMYLASE" in  the last 168 hours. No results for input(s): "AMMONIA" in the last 168 hours. Coagulation Profile: Recent Labs  Lab 09/22/21 1643  INR 1.3*    Cardiac Enzymes: Recent Labs  Lab 09/22/21 1643  CKTOTAL 16*    BNP (last 3 results) No results for input(s): "PROBNP" in the last 8760 hours. HbA1C: No results for input(s): "HGBA1C" in the last 72 hours. CBG: Recent Labs  Lab 09/24/21 1608 09/24/21 2146 09/25/21 0001 09/25/21 0418 09/25/21 0743  GLUCAP 117* 186* 189* 136* 159*    Lipid Profile: No results for input(s): "CHOL", "HDL", "LDLCALC", "TRIG", "CHOLHDL", "LDLDIRECT" in the last 72 hours. Thyroid Function Tests: No results for input(s): "TSH", "T4TOTAL", "FREET4", "T3FREE", "THYROIDAB" in the last 72 hours. Anemia Panel: No results for input(s): "VITAMINB12", "FOLATE", "FERRITIN", "TIBC", "IRON", "RETICCTPCT" in the last 72 hours. Sepsis Labs: Recent Labs  Lab 09/22/21 1643 09/23/21 0214 09/23/21 0737  PROCALCITON  --  2.09  2.08  --   LATICACIDVEN 1.3 2.3* 1.0     Recent Results (from the past 240 hour(s))  Culture, blood (Routine  x 2)     Status: None (Preliminary result)   Collection Time: 09/22/21  4:43 PM   Specimen: BLOOD RIGHT ARM  Result Value Ref Range Status   Specimen Description BLOOD RIGHT ARM  Final   Special Requests   Final    BOTTLES DRAWN AEROBIC AND ANAEROBIC Blood Culture adequate volume   Culture   Final    NO GROWTH 3 DAYS Performed at [facility], [address], [place], [place] [address]    Report Status PENDING  Incomplete  Culture, blood (Routine x 2)     Status: None (Preliminary result)   Collection Time: 09/22/21  4:48 PM   Specimen: BLOOD  Result Value Ref Range Status   Specimen Description BLOOD SITE NOT SPECIFIED  Final   Special Requests   Final    BOTTLES DRAWN AEROBIC AND ANAEROBIC Blood Culture adequate volume   Culture   Final    NO GROWTH 3 DAYS Performed at [facility], [address] [place], [place] [address]    Report Status PENDING  Incomplete  Resp Panel by RT-PCR (Flu A&B, Covid) Anterior Nasal Swab     Status: None   Collection Time: 09/22/21  4:55 PM   Specimen: Anterior Nasal Swab  Result Value Ref Range Status   SARS Coronavirus 2 by RT PCR NEGATIVE NEGATIVE Final    Comment: (NOTE) SARS-CoV-2 target nucleic acids are NOT DETECTED.  The SARS-CoV-2 RNA is generally detectable in upper respiratory specimens during the acute phase of infection. The lowest concentration of SARS-CoV-2 viral copies this assay can detect is 138 copies/mL. A negative result does not preclude SARS-Cov-2 infection and should not be used as the sole basis for treatment or other patient management decisions. A negative result may occur with  improper specimen collection/handling, submission of specimen other than nasopharyngeal swab, presence of viral mutation(s) within the areas targeted by this assay, and inadequate number of viral copies(<138 copies/mL). A negative result must be combined with clinical observations, patient history, and  epidemiological information. The expected result is Negative.  Fact Sheet for Patients:  BloggerCourse.com  Fact Sheet for Healthcare Providers:  SeriousBroker.it  This test is no t yet approved or cleared by the [place] FDA and  has been authorized for detection and/or diagnosis of SARS-CoV-2 by FDA under an Emergency Use Authorization (EUA). This EUA will remain  in effect (  meaning this test can be used) for the duration of the COVID-19 declaration under Section 564(b)(1) of the Act, 21 U.S.C.section 360bbb-3(b)(1), unless the authorization is terminated  or revoked sooner.       Influenza A by PCR NEGATIVE NEGATIVE Final   Influenza B by PCR NEGATIVE NEGATIVE Final    Comment: (NOTE) The Xpert Xpress SARS-CoV-2/FLU/RSV plus assay is intended as an aid in the diagnosis of influenza from Nasopharyngeal swab specimens and should not be used as a sole basis for treatment. Nasal washings and aspirates are unacceptable for Xpert Xpress SARS-CoV-2/FLU/RSV testing.  Fact Sheet for Patients: BloggerCourse.com  Fact Sheet for Healthcare Providers: SeriousBroker.it  This test is not yet approved or cleared by the [place] FDA and has been authorized for detection and/or diagnosis of SARS-CoV-2 by FDA under an Emergency Use Authorization (EUA). This EUA will remain in effect (meaning this test can be used) for the duration of the COVID-19 declaration under Section 564(b)(1) of the Act, 21 U.S.C. section 360bbb-3(b)(1), unless the authorization is terminated or revoked.  Performed at [facility], [address], [place], [place] [address]   MRSA Next Gen by PCR, Nasal     Status: Abnormal   Collection Time: 09/23/21  1:00 AM  Result Value Ref Range Status   MRSA by PCR Next Gen DETECTED (A) NOT DETECTED Final    Comment: RESULT CALLED TO, READ BACK BY AND VERIFIED  WITH: RN SIERRA DONNELL 09/23/21@2 :25 BY TW (NOTE) The GeneXpert MRSA Assay (FDA approved for NASAL specimens only), is one component of a comprehensive MRSA colonization surveillance program. It is not intended to diagnose MRSA infection nor to guide or monitor treatment for MRSA infections. Test performance is not FDA approved in patients less than 46 years old. Performed at [facility], [address] [place], [place] [address]   Urine Culture     Status: None   Collection Time: 09/23/21  5:30 AM   Specimen: In/Out Cath Urine  Result Value Ref Range Status   Specimen Description IN/OUT CATH URINE  Final   Special Requests NONE  Final   Culture   Final    NO GROWTH Performed at [facility], [address] [place], [place] [address]    Report Status 09/24/2021 FINAL  Final         Radiology Studies: DG Abd Portable 1V  Result Date: 09/24/2021 CLINICAL DATA:  Feeding tube placement. EXAM: PORTABLE ABDOMEN - 1 VIEW COMPARISON:  CTA chest 09/22/2021. CT chest, abdomen, and pelvis 01/21/2021. FINDINGS: A feeding tube has been placed and terminates in the right upper quadrant in the expected region of the duodenal bulb. Or contrast material is noted in multiple lower abdominal small bowel loops. No small bowel dilatation is seen to suggest obstruction. A small left pleural effusion and bilateral reticulonodular lung opacities were more fully evaluated on the recent chest CT. No acute osseous abnormality is seen. IMPRESSION: Feeding tube in the region of the duodenal bulb. Electronically Signed   By: Sebastian Ache M.D.   On: 09/24/2021 15:57   [place] CHEST (PLEURAL EFFUSION)  Result Date: 09/23/2021 CLINICAL DATA:  Left pleural effusion. EXAM: CHEST ULTRASOUND COMPARISON:  CT 09/22/2021 FINDINGS: Images of the left lung base demonstrate a small amount of left pleural fluid as seen on recent CT. IMPRESSION: Small left pleural effusion. Electronically Signed   By: Elberta Fortis  M.D.   On: 09/23/2021 12:24  Scheduled Meds:  Chlorhexidine Gluconate Cloth  6 each Topical Q0600   enoxaparin (LOVENOX) injection  40 mg Subcutaneous Q24H   feeding supplement (PROSource TF)  45 mL Per Tube BID   free water  150 mL Per Tube Q4H   insulin aspart  0-9 Units Subcutaneous Q4H   insulin glargine-yfgn  10 Units Subcutaneous QHS   mupirocin ointment  1 Application Nasal BID   potassium chloride  40 mEq Oral Q4H   sodium chloride flush  3 mL Intravenous Q12H   Continuous Infusions:  ceFEPime (MAXIPIME) IV 2 g (09/24/21 2140)   feeding supplement (OSMOLITE 1.5 CAL) 30 mL/hr at 09/25/21 3662   magnesium sulfate bolus IVPB     vancomycin 750 mg (09/24/21 1634)     LOS: 3 days   Time spent:  Azucena Fallen, [name]  If 7PM-7AM, please contact night-coverage [email or web address]  09/25/2021, 8:32 AM

## 2021-09-25 NOTE — Progress Notes (Signed)
Pharmacy Antibiotic Note  Ignacio Lowder is a 51 y.o. male for which pharmacy has been consulted for vancomycin and cefepime dosing for pneumonia. Pt with recent admit for PNA where he left AMA. Cr has remained stable, MRSA PCR positive but other cultures negative thus far.   Plan: Continue cefepime 2g IV q12h Continue vancomycin 750mg  IV q24h Will defer vancomycin levels for now   Height: 5\' 9"  (175.3 cm) Weight: 47.8 kg (105 lb 6.1 oz) IBW/kg (Calculated) : 70.7  Temp (24hrs), Avg:98.6 F (37 C), Min:98.3 F (36.8 C), Max:98.9 F (37.2 C)  Recent Labs  Lab 09/22/21 1643 09/23/21 0214 09/23/21 0737 09/24/21 0314 09/25/21 0511  WBC 9.8 9.0  --  6.2 4.8  CREATININE 0.97 0.77  --  0.55* 0.45*  LATICACIDVEN 1.3 2.3* 1.0  --   --     Estimated Creatinine Clearance: 75.5 mL/min (A) (by C-G formula based on SCr of 0.45 mg/dL (L)).    Allergies  Allergen Reactions   Acetaminophen     Other reaction(s): Other (See Comments) Liver issues    Antimicrobials this admission: Cefepime 6/17 >>  Vancomycin 6/17 >>  Microbiology results: 6/18 MRSA PCR: positive 6/18 UCx: negative 6/18 BCx: NGTD   7/18, PharmD, BCPS, [facility] Clinical Pharmacist [phone] Please check AMION for all [facility] Pharmacy numbers 09/25/2021

## 2021-09-26 ENCOUNTER — Inpatient Hospital Stay (HOSPITAL_COMMUNITY): Payer: Medicaid Other

## 2021-09-26 DIAGNOSIS — J189 Pneumonia, unspecified organism: Secondary | ICD-10-CM | POA: Diagnosis not present

## 2021-09-26 LAB — CBC
HCT: 28 % — ABNORMAL LOW (ref 39.0–52.0)
Hemoglobin: 9.6 g/dL — ABNORMAL LOW (ref 13.0–17.0)
MCH: 32.4 pg (ref 26.0–34.0)
MCHC: 34.3 g/dL (ref 30.0–36.0)
MCV: 94.6 fL (ref 80.0–100.0)
Platelets: 124 10*3/uL — ABNORMAL LOW (ref 150–400)
RBC: 2.96 MIL/uL — ABNORMAL LOW (ref 4.22–5.81)
RDW: 13.3 % (ref 11.5–15.5)
WBC: 5.5 10*3/uL (ref 4.0–10.5)
nRBC: 0 % (ref 0.0–0.2)

## 2021-09-26 LAB — COMPREHENSIVE METABOLIC PANEL
ALT: 21 U/L (ref 0–44)
AST: 30 U/L (ref 15–41)
Albumin: 1.9 g/dL — ABNORMAL LOW (ref 3.5–5.0)
Alkaline Phosphatase: 100 U/L (ref 38–126)
Anion gap: 6 (ref 5–15)
BUN: 14 mg/dL (ref 6–20)
CO2: 23 mmol/L (ref 22–32)
Calcium: 7.8 mg/dL — ABNORMAL LOW (ref 8.9–10.3)
Chloride: 106 mmol/L (ref 98–111)
Creatinine, Ser: 0.48 mg/dL — ABNORMAL LOW (ref 0.61–1.24)
GFR, Estimated: >60 mL/min (ref >60)
Glucose, Bld: 175 mg/dL — ABNORMAL HIGH (ref 70–99)
Potassium: 4 mmol/L (ref 3.5–5.1)
Sodium: 135 mmol/L (ref 135–145)
Total Bilirubin: 0.6 mg/dL (ref 0.3–1.2)
Total Protein: 5.5 g/dL — ABNORMAL LOW (ref 6.5–8.1)

## 2021-09-26 LAB — GLUCOSE, CAPILLARY
Glucose-Capillary: 165 mg/dL — ABNORMAL HIGH (ref 70–99)
Glucose-Capillary: 177 mg/dL — ABNORMAL HIGH (ref 70–99)
Glucose-Capillary: 193 mg/dL — ABNORMAL HIGH (ref 70–99)
Glucose-Capillary: 195 mg/dL — ABNORMAL HIGH (ref 70–99)
Glucose-Capillary: 206 mg/dL — ABNORMAL HIGH (ref 70–99)
Glucose-Capillary: 310 mg/dL — ABNORMAL HIGH (ref 70–99)

## 2021-09-26 LAB — VANCOMYCIN, PEAK: Vancomycin Pk: 6 ug/mL — ABNORMAL LOW (ref 30–40)

## 2021-09-26 MED ORDER — OSMOLITE 1.5 CAL PO LIQD
237.0000 mL | Freq: Every day | ORAL | Status: DC
  Administered 2021-09-26 – 2021-09-27 (×6): 237 mL
  Filled 2021-09-26 (×13): qty 237

## 2021-09-26 MED ORDER — VANCOMYCIN HCL 1250 MG/250ML IV SOLN
1250.0000 mg | INTRAVENOUS | Status: DC
  Administered 2021-09-26: 1250 mg via INTRAVENOUS
  Filled 2021-09-26 (×2): qty 250

## 2021-09-26 MED ORDER — SODIUM CHLORIDE 0.9 % IV SOLN
2.0000 g | Freq: Three times a day (TID) | INTRAVENOUS | Status: DC
  Administered 2021-09-26 – 2021-09-27 (×3): 2 g via INTRAVENOUS
  Filled 2021-09-26 (×3): qty 12.5

## 2021-09-26 NOTE — Care Management (Signed)
  Transition of Care [facility]) Screening Note   Patient Details  Name: Antonio [name] Date of Birth: March 26, 1971   Transition of Care [facility]) CM/SW Contact:    Lawerance Sabal, RN Phone Number: 09/26/2021, 8:05 AM    Transition of Care Department [facility]) has reviewed patient and we will continue to monitor patient advancement through interdisciplinary progression rounds.

## 2021-09-26 NOTE — Plan of Care (Signed)
  Problem: Education: Goal: Ability to describe self-care measures that may prevent or decrease complications (Diabetes Survival Skills Education) will improve Outcome: Not Progressing Goal: Individualized Educational Video(s) Outcome: Not Progressing   Problem: Coping: Goal: Ability to adjust to condition or change in health will improve Outcome: Not Progressing   Problem: Fluid Volume: Goal: Ability to maintain a balanced intake and output will improve Outcome: Not Progressing   Problem: Health Behavior/Discharge Planning: Goal: Ability to identify and utilize available resources and services will improve Outcome: Not Progressing Goal: Ability to manage health-related needs will improve Outcome: Not Progressing   Problem: Metabolic: Goal: Ability to maintain appropriate glucose levels will improve Outcome: Not Progressing   Problem: Nutritional: Goal: Maintenance of adequate nutrition will improve Outcome: Not Progressing Goal: Progress toward achieving an optimal weight will improve Outcome: Not Progressing   Problem: Skin Integrity: Goal: Risk for impaired skin integrity will decrease Outcome: Not Progressing   Problem: Tissue Perfusion: Goal: Adequacy of tissue perfusion will improve Outcome: Not Progressing   Problem: Activity: Goal: Ability to tolerate increased activity will improve Outcome: Not Progressing   Problem: Clinical Measurements: Goal: Ability to maintain a body temperature in the normal range will improve Outcome: Not Progressing   Problem: Respiratory: Goal: Ability to maintain adequate ventilation will improve Outcome: Not Progressing Goal: Ability to maintain a clear airway will improve Outcome: Not Progressing   Problem: Education: Goal: Knowledge of General Education information will improve Description: Including pain rating scale, medication(s)/side effects and non-pharmacologic comfort measures Outcome: Not Progressing   Problem:  Health Behavior/Discharge Planning: Goal: Ability to manage health-related needs will improve Outcome: Not Progressing   Problem: Clinical Measurements: Goal: Ability to maintain clinical measurements within normal limits will improve Outcome: Not Progressing Goal: Will remain free from infection Outcome: Not Progressing Goal: Diagnostic test results will improve Outcome: Not Progressing Goal: Respiratory complications will improve Outcome: Not Progressing Goal: Cardiovascular complication will be avoided Outcome: Not Progressing   Problem: Activity: Goal: Risk for activity intolerance will decrease Outcome: Not Progressing   Problem: Nutrition: Goal: Adequate nutrition will be maintained Outcome: Not Progressing   Problem: Coping: Goal: Level of anxiety will decrease Outcome: Not Progressing   Problem: Elimination: Goal: Will not experience complications related to bowel motility Outcome: Not Progressing Goal: Will not experience complications related to urinary retention Outcome: Not Progressing   Problem: Pain Managment: Goal: General experience of comfort will improve Outcome: Not Progressing   Problem: Safety: Goal: Ability to remain free from injury will improve Outcome: Not Progressing   Problem: Skin Integrity: Goal: Risk for impaired skin integrity will decrease Outcome: Not Progressing

## 2021-09-26 NOTE — Progress Notes (Addendum)
Pharmacy Antibiotic Note  Antonio [name] is a 51 y.o. male for which pharmacy has been consulted for vancomycin and cefepime dosing for pneumonia. Pt with recent admit for PNA where he left AMA. Cr has remained stable, MRSA PCR positive but other cultures negative thus far.   Discussed with MD - pt will likely require long course of ABX given cavitary PNA. Will adjust cefepime and check vancomycin levels.  Plan: Increase cefepime 2g IV q8h Continue vancomycin 750mg  IV q24h Check vancomycin peak and trough  ADDENDUM: ordered vancomycin peak (delayed) to assess 2-level PK and calculate AUC. Vancomycin peak was 6 mcg/ml - this will inevitably lead to an undetectable trough which will be unable to calculate AUC. Will empirically increase vancomycin to 1250mg  IV q24h (est AUC 524).  Height: 5\' 9"  (175.3 cm) Weight: 48.6 kg (107 lb 2.3 oz) IBW/kg (Calculated) : 70.7  Temp (24hrs), Avg:97.9 F (36.6 C), Min:97.6 F (36.4 C), Max:98.7 F (37.1 C)  Recent Labs  Lab 09/22/21 1643 09/23/21 0214 09/23/21 0737 09/24/21 0314 09/25/21 0511 09/26/21 0200  WBC 9.8 9.0  --  6.2 4.8 5.5  CREATININE 0.97 0.77  --  0.55* 0.45* 0.48*  LATICACIDVEN 1.3 2.3* 1.0  --   --   --      Estimated Creatinine Clearance: 76.8 mL/min (A) (by C-G formula based on SCr of 0.48 mg/dL (L)).    Allergies  Allergen Reactions   Acetaminophen     Other reaction(s): Other (See Comments) Liver issues    Antimicrobials this admission: Cefepime 6/17 >>  Vancomycin 6/17 >>  Microbiology results: 6/18 MRSA PCR: positive 6/18 UCx: negative 6/18 BCx: NGTD   7/18, PharmD, BCPS, [facility] Clinical Pharmacist [phone] Please check AMION for all [facility] numbers 09/26/2021

## 2021-09-26 NOTE — Progress Notes (Signed)
PROGRESS NOTE    Antonio [name]  [id number] DOB: 04-01-1971 DOA: 09/22/2021 PCP: [name], DO   Brief Narrative:  Antonio [name] is a cachectic and chronically ill appearing 51 y.o. 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  He was initially planned for IV antibiotics through 08/13/2021.    In the interim, likely due to noncompliance with antibiotic therapy, patient's respiratory status and clinical condition deteriorated with worsening shortness of breath fatigue and pleuritic pain who ultimately presented to our facility on 09/22/2021 with imaging and work-up consistent with ongoing necrotizing pneumonia.  Assessment & Plan:   Principal Problem:   Multifocal pneumonia Active Problems:   MDD (major depressive disorder), recurrent episode, moderate (HCC)   Polysubstance abuse ([place])   Uncontrolled type 2 diabetes mellitus with hyperglycemia, without long-term current use of insulin (HCC)   Pleural effusion, left   Protein-calorie malnutrition, severe (HCC)   Pressure injury of skin  Severe sepsis secondary to necrotizing pneumonia, POA Concurrent pleural effusion/possible aspiration Concurrent acute hypoxic respiratory failure  -Continue broad-spectrum IV antibiotics including cefepime, vancomycin -Sepsis criteria at intake including tachycardia, tachypnea with notable source of infection -IR requested in consult at intake for possible evaluation of parapneumonic fluid for possible drainage and culture, repeat chest x-ray in the morning to follow along, clinically stable if not  improving. -Cultures currently pending, previous hospitalization showed MRSA and Klebsiella positive cultures at that time, in-house cultures are pending -Continue to wean oxygen as tolerated -Speech following for dysphagia -failed MBS, concern for aspiration likely chronic - NG tube currently ongoing as below   Uncontrolled type II DM with hyperglycemia - A1c 12.5% - Glucose >500 at intake despite poor PO intake -Continue sliding scale insulin, hypoglycemic protocol -Likely liberalize diet in the next 24 to 48 hours if safe to take p.o. given below   Dysphagia  -Profoundly failed speech evaluation; both bedside and MBS -NG tube ongoing, patient already gaining weight -attempting to transition to bolus tube feeds -Speech continues to follow, appreciate their insight and recommendations -once able to swallow safely would certainly be reasonable to advance diet as appropriate   Severe protein-calorie malnutrition  Electrolyte abnormalities - Cachectic with BMI 14;  - Continue NG -defer to dietary for caloric management - K/Mg repleted as appropriate - Phos stable without signs or symptoms of refeeding syndrome   Polysubstance abuse, ongoing -Patient's UDS positive for opiates and amphetamines during his last 2 hospitalizations; at [facility] in April he tested positive for Dilaudid, oxymorphone, oxycodone and morphine -Patient reports recent abstinence of narcotics since May 14 but this cannot be confirmed -Follow closely for signs and symptoms of withdrawal  Hypotensive, multifactorial -No consideration for shock as above -Likely secondary to sepsis, profound malnourished state, likely chronic dysphagia, uncontrolled DM, and dehydration -Continue NG feeds/fluids - IVF as necessary  Chronic anemia of chronic disease/malnutrition -Continue nutrition as above, follow daily labs, transfusion if less than 7 or symptomatic -Hemoglobin appears to be stabilizing now with p.o. intake via NG  tube  Profound noncompliance -Patient attempting to take p.o. today despite being ordered n.p.o. at bedside educated on the risks of aspiration -Recently left previous hospital despite profound and advanced  infection process for cigarettes -offered nicotine patch which patient adamantly refused this morning -We discussed that should patient continue to be noncompliant with his medical care he is only likely to place himself at high risk for morbidity and mortality and ultimately death should he continue to attempt to take p.o. inappropriately given his high risk for aspiration   DVT prophylaxis: Lovenox  Code Status: Full  Family Communication: None present  Status is: Inpatient  Dispo: The patient is from: Home              Anticipated d/c is to: Home              Anticipated d/c date is: To be determined              Patient currently not medically stable for discharge  Consultants:  IR  Procedures:  None  Antimicrobials:  Cefepime, vancomycin  Subjective: No acute issues or events overnight -patient continues to be agitated with quite labile mood; continues to lash out, cry, and throw things when educated that he cannot take p.o. safely despite the fact that he is currently getting NG tube feeds.  Objective: Vitals:   09/25/21 1957 09/25/21 2057 09/26/21 0013 09/26/21 0421  BP: 92/60 92/60 (!) 83/59 (!) 88/65  Pulse: 72 70 68 78  Resp: 16 16 14 20   Temp: 97.7 F (36.5 C) 97.7 F (36.5 C) 97.7 F (36.5 C) 97.6 F (36.4 C)  TempSrc:   Oral Oral  SpO2:   100% 98%  Weight:    48.6 kg  Height:        Intake/Output Summary (Last 24 hours) at 09/26/2021 0704 Last data filed at 09/26/2021 09/28/2021 Gross per 24 hour  Intake 3903.33 ml  Output 2750 ml  Net 1153.33 ml    Filed Weights   09/23/21 0039 09/25/21 0456 09/26/21 0421  Weight: 43.4 kg 47.8 kg 48.6 kg    Examination:  General: Cachectic appearing gentleman resting in bed no acute distress.  Core track NG tube  in left nare intact Lungs:  Clear to auscultate bilaterally without rhonchi, wheeze, or rales. Heart:  Regular rate and rhythm.  Without murmurs, rubs, or gallops. Abdomen:  Soft, nontender, nondistended.  Without guarding or rebound. Musculoskeletal/extremities: Clear muscle wasting, gaunt appearance of both torso and all 4 limbs.  Ribs/clavicle clearly visible. Strength 3/5 globally.   Data Reviewed: I have personally reviewed following labs and imaging studies  CBC: Recent Labs  Lab 09/22/21 1643 09/22/21 1758 09/23/21 0214 09/24/21 0314 09/25/21 0511 09/26/21 0200  WBC 9.8  --  9.0 6.2 4.8 5.5  NEUTROABS 7.5  --   --   --   --   --   HGB 11.7* 13.3 10.8* 9.9* 10.1* 9.6*  HCT 35.0* 39.0 32.5* 28.6* 29.3* 28.0*  MCV 98.3  --  97.3 95.7 94.8 94.6  PLT 174  --  158 120* 125* 124*    Basic Metabolic Panel: Recent Labs  Lab 09/22/21 1643 09/22/21 1758 09/23/21 0214 09/24/21 0314 09/24/21 1542 09/25/21 0511 09/25/21 1640 09/26/21 0200  NA 141 142 140 139  --  134*  --  135  K 4.3 4.5 3.5 2.9*  --  3.2*  --  4.0  CL 106  --  107 107  --  102  --  106  CO2 20*  --  23 26  --  25  --  23  GLUCOSE 528*  --  251* 90  --  133*  --  175*  BUN 28*  --  19 12  --  12  --  14  CREATININE 0.97  --  0.77 0.55*  --  0.45*  --  0.48*  CALCIUM 9.1  --  8.9 8.2*  --  8.0*  --  7.8*  MG  --   --  1.7  --  1.3* 1.5* 2.2  --   PHOS  --   --  2.1*  --  3.2 3.1 2.6  --     GFR: Estimated Creatinine Clearance: 76.8 mL/min (A) (by C-G formula based on SCr of 0.48 mg/dL (L)). Liver Function Tests: Recent Labs  Lab 09/22/21 1643 09/23/21 0214 09/24/21 0314 09/25/21 0511 09/26/21 0200  AST 25 26 26 29 30   ALT 40 35 26 23 21   ALKPHOS 156* 130* 106 97 100  BILITOT 1.5* 0.7 0.8 0.8 0.6  PROT 7.3 6.8 5.8* 6.0* 5.5*  ALBUMIN 2.7* 2.5* 2.0* 2.0* 1.9*    No results for input(s): "LIPASE", "AMYLASE" in the last 168 hours. No results for input(s): "AMMONIA" in the last 168  hours. Coagulation Profile: Recent Labs  Lab 09/22/21 1643  INR 1.3*    Cardiac Enzymes: Recent Labs  Lab 09/22/21 1643  CKTOTAL 16*    BNP (last 3 results) No results for input(s): "PROBNP" in the last 8760 hours. HbA1C: No results for input(s): "HGBA1C" in the last 72 hours. CBG: Recent Labs  Lab 09/25/21 1130 09/25/21 1634 09/25/21 2002 09/26/21 0011 09/26/21 0427  GLUCAP 126* 243* 181* 165* 177*    Lipid Profile: No results for input(s): "CHOL", "HDL", "LDLCALC", "TRIG", "CHOLHDL", "LDLDIRECT" in the last 72 hours. Thyroid Function Tests: No results for input(s): "TSH", "T4TOTAL", "FREET4", "T3FREE", "THYROIDAB" in the last 72 hours. Anemia Panel: No results for input(s): "VITAMINB12", "FOLATE", "FERRITIN", "TIBC", "IRON", "RETICCTPCT" in the last 72 hours. Sepsis Labs: Recent Labs  Lab 09/22/21 1643 09/23/21 0214 09/23/21 0737  PROCALCITON  --  2.09  2.08  --   LATICACIDVEN 1.3 2.3* 1.0     Recent Results (from the past 240 hour(s))  Culture, blood (Routine x 2)     Status: None (Preliminary result)   Collection Time: 09/22/21  4:43 PM   Specimen: BLOOD RIGHT ARM  Result Value Ref Range Status   Specimen Description BLOOD RIGHT ARM  Final   Special Requests   Final    BOTTLES DRAWN AEROBIC AND ANAEROBIC Blood Culture adequate volume   Culture   Final    NO GROWTH 4 DAYS Performed at [facility], [address] [place], [place] [address]    Report Status PENDING  Incomplete  Culture, blood (Routine x 2)     Status: None (Preliminary result)   Collection Time: 09/22/21  4:48 PM   Specimen: BLOOD  Result Value Ref Range Status   Specimen Description BLOOD SITE NOT SPECIFIED  Final   Special Requests   Final    BOTTLES DRAWN AEROBIC AND ANAEROBIC Blood Culture adequate volume   Culture   Final    NO GROWTH 4 DAYS Performed at [facility], [address] [place], [place] [address]    Report Status PENDING  Incomplete  Resp Panel  by RT-PCR (Flu A&B, Covid) Anterior Nasal Swab     Status: None   Collection Time: 09/22/21  4:55 PM   Specimen: Anterior Nasal Swab  Result Value Ref Range Status   SARS Coronavirus 2 by RT PCR NEGATIVE NEGATIVE Final  Comment: (NOTE) SARS-CoV-2 target nucleic acids are NOT DETECTED.  The SARS-CoV-2 RNA is generally detectable in upper respiratory specimens during the acute phase of infection. The lowest concentration of SARS-CoV-2 viral copies this assay can detect is 138 copies/mL. A negative result does not preclude SARS-Cov-2 infection and should not be used as the sole basis for treatment or other patient management decisions. A negative result may occur with  improper specimen collection/handling, submission of specimen other than nasopharyngeal swab, presence of viral mutation(s) within the areas targeted by this assay, and inadequate number of viral copies(<138 copies/mL). A negative result must be combined with clinical observations, patient history, and epidemiological information. The expected result is Negative.  Fact Sheet for Patients:  BloggerCourse.com  Fact Sheet for Healthcare Providers:  SeriousBroker.it  This test is no t yet approved or cleared by the [place] FDA and  has been authorized for detection and/or diagnosis of SARS-CoV-2 by FDA under an Emergency Use Authorization (EUA). This EUA will remain  in effect (meaning this test can be used) for the duration of the COVID-19 declaration under Section 564(b)(1) of the Act, 21 U.S.C.section 360bbb-3(b)(1), unless the authorization is terminated  or revoked sooner.       Influenza A by PCR NEGATIVE NEGATIVE Final   Influenza B by PCR NEGATIVE NEGATIVE Final    Comment: (NOTE) The Xpert Xpress SARS-CoV-2/FLU/RSV plus assay is intended as an aid in the diagnosis of influenza from Nasopharyngeal swab specimens and should not be used as a sole basis  for treatment. Nasal washings and aspirates are unacceptable for Xpert Xpress SARS-CoV-2/FLU/RSV testing.  Fact Sheet for Patients: BloggerCourse.com  Fact Sheet for Healthcare Providers: SeriousBroker.it  This test is not yet approved or cleared by the [place] FDA and has been authorized for detection and/or diagnosis of SARS-CoV-2 by FDA under an Emergency Use Authorization (EUA). This EUA will remain in effect (meaning this test can be used) for the duration of the COVID-19 declaration under Section 564(b)(1) of the Act, 21 U.S.C. section 360bbb-3(b)(1), unless the authorization is terminated or revoked.  Performed at [facility], [address], [place], [place] [address]   MRSA Next Gen by PCR, Nasal     Status: Abnormal   Collection Time: 09/23/21  1:00 AM  Result Value Ref Range Status   MRSA by PCR Next Gen DETECTED (A) NOT DETECTED Final    Comment: RESULT CALLED TO, READ BACK BY AND VERIFIED WITH: RN SIERRA DONNELL 09/23/21@2 :25 BY TW (NOTE) The GeneXpert MRSA Assay (FDA approved for NASAL specimens only), is one component of a comprehensive MRSA colonization surveillance program. It is not intended to diagnose MRSA infection nor to guide or monitor treatment for MRSA infections. Test performance is not FDA approved in patients less than 104 years old. Performed at [facility], [address] [place], [place] [address]   Urine Culture     Status: None   Collection Time: 09/23/21  5:30 AM   Specimen: In/Out Cath Urine  Result Value Ref Range Status   Specimen Description IN/OUT CATH URINE  Final   Special Requests NONE  Final   Culture   Final    NO GROWTH Performed at [facility], [address], [place], [place] [address]    Report Status 09/24/2021 FINAL  Final         Radiology Studies: DG Abd Portable 1V  Result Date: 09/24/2021 CLINICAL DATA:  Feeding tube placement. EXAM:  PORTABLE ABDOMEN -  1 VIEW COMPARISON:  CTA chest 09/22/2021. CT chest, abdomen, and pelvis 01/21/2021. FINDINGS: A feeding tube has been placed and terminates in the right upper quadrant in the expected region of the duodenal bulb. Or contrast material is noted in multiple lower abdominal small bowel loops. No small bowel dilatation is seen to suggest obstruction. A small left pleural effusion and bilateral reticulonodular lung opacities were more fully evaluated on the recent chest CT. No acute osseous abnormality is seen. IMPRESSION: Feeding tube in the region of the duodenal bulb. Electronically Signed   By: Logan Bores M.D.   On: 09/24/2021 15:57        Scheduled Meds:  Chlorhexidine Gluconate Cloth  6 each Topical Q0600   enoxaparin (LOVENOX) injection  40 mg Subcutaneous Q24H   feeding supplement (PROSource TF)  45 mL Per Tube BID   free water  150 mL Per Tube Q4H   insulin aspart  0-9 Units Subcutaneous Q4H   insulin glargine-yfgn  10 Units Subcutaneous QHS   mupirocin ointment  1 Application Nasal BID   sodium chloride flush  3 mL Intravenous Q12H   Continuous Infusions:  ceFEPime (MAXIPIME) IV 2 g (09/26/21 0544)   feeding supplement (OSMOLITE 1.5 CAL) 40 mL/hr at 09/26/21 0436   vancomycin 750 mg (09/25/21 1918)     LOS: 4 days   Time spent: 14min  Alistair Senft C Deep [place], DO [facility]  If 7PM-7AM, please contact night-coverage [email or web address]  09/26/2021, 7:04 AM

## 2021-09-26 NOTE — Plan of Care (Signed)
  Problem: Education: Goal: Ability to describe self-care measures that may prevent or decrease complications (Diabetes Survival Skills Education) will improve Outcome: Progressing Goal: Individualized Educational Video(s) Outcome: Progressing   Problem: Coping: Goal: Ability to adjust to condition or change in health will improve Outcome: Progressing   Problem: Fluid Volume: Goal: Ability to maintain a balanced intake and output will improve Outcome: Progressing   Problem: Health Behavior/Discharge Planning: Goal: Ability to identify and utilize available resources and services will improve Outcome: Progressing Goal: Ability to manage health-related needs will improve Outcome: Progressing   Problem: Metabolic: Goal: Ability to maintain appropriate glucose levels will improve Outcome: Progressing   Problem: Nutritional: Goal: Maintenance of adequate nutrition will improve Outcome: Progressing Goal: Progress toward achieving an optimal weight will improve Outcome: Progressing   Problem: Skin Integrity: Goal: Risk for impaired skin integrity will decrease Outcome: Progressing   Problem: Tissue Perfusion: Goal: Adequacy of tissue perfusion will improve Outcome: Progressing   Problem: Activity: Goal: Ability to tolerate increased activity will improve Outcome: Progressing   Problem: Clinical Measurements: Goal: Ability to maintain a body temperature in the normal range will improve Outcome: Progressing   Problem: Respiratory: Goal: Ability to maintain adequate ventilation will improve Outcome: Progressing Goal: Ability to maintain a clear airway will improve Outcome: Progressing   Problem: Education: Goal: Knowledge of General Education information will improve Description: Including pain rating scale, medication(s)/side effects and non-pharmacologic comfort measures Outcome: Progressing   Problem: Health Behavior/Discharge Planning: Goal: Ability to manage  health-related needs will improve Outcome: Progressing   Problem: Clinical Measurements: Goal: Ability to maintain clinical measurements within normal limits will improve Outcome: Progressing Goal: Will remain free from infection Outcome: Progressing Goal: Diagnostic test results will improve Outcome: Progressing Goal: Respiratory complications will improve Outcome: Progressing Goal: Cardiovascular complication will be avoided Outcome: Progressing   Problem: Activity: Goal: Risk for activity intolerance will decrease Outcome: Progressing   Problem: Nutrition: Goal: Adequate nutrition will be maintained Outcome: Progressing   Problem: Coping: Goal: Level of anxiety will decrease Outcome: Progressing   Problem: Elimination: Goal: Will not experience complications related to bowel motility Outcome: Progressing Goal: Will not experience complications related to urinary retention Outcome: Progressing   Problem: Pain Managment: Goal: General experience of comfort will improve Outcome: Progressing   Problem: Safety: Goal: Ability to remain free from injury will improve Outcome: Progressing   Problem: Skin Integrity: Goal: Risk for impaired skin integrity will decrease Outcome: Progressing   

## 2021-09-26 NOTE — Progress Notes (Signed)
Speech Language Pathology Treatment: Dysphagia  Patient Details Name: Antonio [name] MRN: [id number] DOB: Apr 23, 1970 Today's Date: 09/26/2021 Time: 0947-0962 SLP Time Calculation (min) (ACUTE ONLY): 15 min  Assessment / Plan / Recommendation Clinical Impression  Pt seen laying in bed. SLP assisted pt with recognizing appropriate posture for PO intake. Provided a cup on honey thick water, 4 oz of puree and a pack of graham crackers. Pt was able to follow instructions to reduce bolus size x3, swallow again x3 and implement a chin tuck. Pt carried over chin tuck when provided with indirect supervision. Pt verbalized that he doesn't want to remain NPO. SLP asked pt why he has pna. Pt stated its because his food is going into his lungs and asks why we cant just close them up? SLP explained that his lungs have to stay open for him to breathe, but if he follows instructions to sit upright, tuck his chin and take smaller bites and sips he can stop the food from going down wrong so much. Pt promises to do this and begs SLP for more food. Pt does not seem comforted by promise that he is getting the best nutrition hes ever had via his NG tube.   Pt appears very anxious about access to food, has likely experienced food insecurity and severe hunger in his life.  Though his reasoning is simplistic and cognition is impaired, pt would likely benefit from further conversations about code status and plan of care as he continually verbalizes that he does not want to be tube fed only and wants to eat.  His dysphagia is chronic in nature with little expectation of improvement other than via use of compensatory strategies to reduce, but not eliminate risk. Compensatory strategies are best practiced in the setting of functional oral intake. Unlikely that starting a diet today or in a week is going to change his function, other than to make him more anxious and impulsive when eating again. Will continue daily interventions and food  offerings with supervision for strategies; recommend pt allowed least restrictive diet with known risk: Dys 3/honey thick liquids.      HPI HPI: Antonio [name] is a chronically very ill 51 y.o. homeless male admitted with necrotizing pna, dysphagia.  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 Pt was noted to choke with liquids in the ED, hence swallow evaluation ordered. 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 Negative for malignancy." and sinus contents resulting mild chronic inflammation.   MBS on 05/11/21 "Patient presents with a primary pharyngeal dysphagia at high risk for aspiration of liquids. Chin tuck was successful in eliminating aspiration of thin and nectar thick liquids. Oral swallow characterized by decreased bolus manipulation and mastication question secondary to large bolus and edentulous state with solids. Patient had premature spillage of thin liquids, nectar liquids, and honey thick liquids to the level of the piriform sinus. Chin tuck did decrease level of spillage to vallecular space with nectar thick liquid trials."      SLP Plan  Continue with current plan of care      Recommendations for follow up therapy are one component of a multi-disciplinary discharge planning process,  led by the attending physician.  Recommendations may be updated based on patient status, additional functional criteria and insurance authorization.    Recommendations  Diet recommendations: Honey-thick liquid;Dysphagia 3 (mechanical soft) Liquids provided via:  Cup Medication Administration: Via alternative means Supervision: Full supervision/cueing for compensatory strategies Compensations: Chin tuck;Multiple dry swallows after each bite/sip;Slow rate;Small sips/bites Postural Changes and/or Swallow Maneuvers: Seated upright 90 degrees                Follow Up Recommendations: Other (comment) Assistance recommended at discharge: Frequent or constant Supervision/Assistance SLP Visit Diagnosis: Dysphagia, oropharyngeal phase (R13.12) Plan: Continue with current plan of care           Antonio [name], [name]  09/26/2021, 2:06 PM

## 2021-09-27 DIAGNOSIS — J189 Pneumonia, unspecified organism: Secondary | ICD-10-CM | POA: Diagnosis not present

## 2021-09-27 LAB — CULTURE, BLOOD (ROUTINE X 2)
Culture: NO GROWTH
Culture: NO GROWTH
Special Requests: ADEQUATE
Special Requests: ADEQUATE

## 2021-09-27 LAB — CBC
HCT: 27.6 % — ABNORMAL LOW (ref 39.0–52.0)
Hemoglobin: 9.5 g/dL — ABNORMAL LOW (ref 13.0–17.0)
MCH: 32.4 pg (ref 26.0–34.0)
MCHC: 34.4 g/dL (ref 30.0–36.0)
MCV: 94.2 fL (ref 80.0–100.0)
Platelets: 136 10*3/uL — ABNORMAL LOW (ref 150–400)
RBC: 2.93 MIL/uL — ABNORMAL LOW (ref 4.22–5.81)
RDW: 13.4 % (ref 11.5–15.5)
WBC: 5.4 10*3/uL (ref 4.0–10.5)
nRBC: 0 % (ref 0.0–0.2)

## 2021-09-27 LAB — GLUCOSE, CAPILLARY
Glucose-Capillary: 250 mg/dL — ABNORMAL HIGH (ref 70–99)
Glucose-Capillary: 290 mg/dL — ABNORMAL HIGH (ref 70–99)
Glucose-Capillary: 296 mg/dL — ABNORMAL HIGH (ref 70–99)
Glucose-Capillary: 72 mg/dL (ref 70–99)
Glucose-Capillary: 74 mg/dL (ref 70–99)
Glucose-Capillary: 81 mg/dL (ref 70–99)
Glucose-Capillary: 82 mg/dL (ref 70–99)
Glucose-Capillary: 99 mg/dL (ref 70–99)

## 2021-09-27 LAB — COMPREHENSIVE METABOLIC PANEL
ALT: 21 U/L (ref 0–44)
AST: 37 U/L (ref 15–41)
Albumin: 1.9 g/dL — ABNORMAL LOW (ref 3.5–5.0)
Alkaline Phosphatase: 106 U/L (ref 38–126)
Anion gap: 4 — ABNORMAL LOW (ref 5–15)
BUN: 11 mg/dL (ref 6–20)
CO2: 24 mmol/L (ref 22–32)
Calcium: 7.7 mg/dL — ABNORMAL LOW (ref 8.9–10.3)
Chloride: 102 mmol/L (ref 98–111)
Creatinine, Ser: 0.5 mg/dL — ABNORMAL LOW (ref 0.61–1.24)
GFR, Estimated: >60 mL/min (ref >60)
Glucose, Bld: 83 mg/dL (ref 70–99)
Potassium: 3.8 mmol/L (ref 3.5–5.1)
Sodium: 130 mmol/L — ABNORMAL LOW (ref 135–145)
Total Bilirubin: 0.6 mg/dL (ref 0.3–1.2)
Total Protein: 5.7 g/dL — ABNORMAL LOW (ref 6.5–8.1)

## 2021-09-27 MED ORDER — FREE WATER
100.0000 mL | Freq: Every day | Status: DC
  Administered 2021-09-27 – 2021-09-28 (×5): 100 mL

## 2021-09-27 NOTE — Progress Notes (Signed)
PROGRESS NOTE    Artez Rigoni  [id number] DOB: 04-17-1970 DOA: 09/22/2021 PCP: [name], DO   Brief Narrative:  Legend Kolodziej is a cachectic and chronically ill appearing 51 y.o. 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  He was initially planned for IV antibiotics through 08/13/2021.    In the interim, likely due to noncompliance with antibiotic therapy, patient's respiratory status and clinical condition deteriorated with worsening shortness of breath fatigue and pleuritic pain who ultimately presented to our facility on 09/22/2021 with imaging and work-up consistent with ongoing necrotizing pneumonia.  Assessment & Plan:   Principal Problem:   Multifocal pneumonia Active Problems:   MDD (major depressive disorder), recurrent episode, moderate (HCC)   Polysubstance abuse ([place])   Uncontrolled type 2 diabetes mellitus with hyperglycemia, without long-term current use of insulin (HCC)   Pleural effusion, left   Protein-calorie malnutrition, severe (HCC)   Pressure injury of skin  Goals of care Lengthy discussion with patient today about goals of care again He remains combative about diet and difficult to have a meaningful conversation with He does admit he does not want to be intubated (as previously discussed) but also admits today he no longer wants any heroic measures/procedures -Palliative care has been consulted to follow along as patient is interested in possible hospice/palliative care in light of comfort feedings. I attempted to make very clear to the patient that his risk of  aspirating on food remains extremely high and that he might very well aspirate or decompensate with PO intake. He agreed to discuss outcomes with palliative care.  Severe sepsis secondary to presumed necrotizing pneumonia, POA Concurrent pleural effusion/likely aspiration Concurrent acute hypoxic respiratory failure  -Completed broad-spectrum IV antibiotics including cefepime, vancomycin -Sepsis criteria at intake including tachycardia, tachypnea with notable source of infection -IR requested in consult at intake for possible thoracentesis but not amenable -Continue to wean oxygen as tolerated -Speech following for dysphagia -failed MBS, concern for aspiration likely chronic - NG tube currently ongoing as below   Uncontrolled type II DM with hyperglycemia - A1c 12.5% - Glucose >500 at intake despite poor PO intake -Continue sliding scale insulin, hypoglycemic protocol -Likely liberalize diet in the next 24 to 48 hours if safe to take p.o. given below   Dysphagia  -Profoundly failed speech evaluation; both bedside and MBS -NG tube ongoing, patient already gaining weight -attempting to transition to bolus tube feeds -Speech continues to follow, appreciate their insight and recommendations -once able to swallow safely would certainly be reasonable to advance diet as appropriate -Hospice discussion ongoing as above   Severe protein-calorie malnutrition  Electrolyte abnormalities - Cachectic with BMI 14;  - Continue NG -defer to dietary for caloric management - K/Mg repleted as appropriate - Phos stable currently - follow closely for refeeding syndrome given likely profound fasting state.   Polysubstance abuse, ongoing -Patient's UDS positive for opiates and amphetamines during his last 2 hospitalizations; at [facility] in April he tested positive for Dilaudid, oxymorphone, oxycodone and morphine -Patient reports recent abstinence of narcotics since May 14 but this cannot be  confirmed -Follow closely for signs and symptoms of withdrawal  Hypotensive, multifactorial -No consideration for  shock as above -Likely secondary to sepsis, profound malnourished state, likely chronic dysphagia, uncontrolled DM, and dehydration -Continue NG feeds/fluids - IVF as necessary  Chronic anemia of chronic disease/malnutrition -Continue nutrition as above, follow daily labs, transfusion if less than 7 or symptomatic -Hemoglobin appears to be stabilizing now with p.o. intake via NG tube  Profound noncompliance -Patient attempting to take p.o. today despite being ordered n.p.o. at bedside educated on the risks of aspiration -Recently left previous hospital despite profound and advanced infection process for cigarettes -offered nicotine patch which patient adamantly refused this morning -We discussed that should patient continue to be noncompliant with his medical care he is only likely to place himself at high risk for morbidity and mortality and ultimately death should he continue to attempt to take p.o. inappropriately given his high risk for aspiration   DVT prophylaxis: Lovenox  Code Status: Full  Family Communication: None present  Status is: Inpatient  Dispo: The patient is from: Home              Anticipated d/c is to: TBD - potentially hospice at home vs hospice house.              Anticipated d/c date is: To be determined              Patient currently not medically stable for discharge  Consultants:  IR  Procedures:  None  Antimicrobials:  Cefepime, vancomycin  Subjective: No acute issues or events overnight -patient continues to be agitated with quite labile mood; demanding food again. We discussed symptoms and goals of care as above. No other complaints from patient other than "wanting food"  Objective: Vitals:   09/26/21 1929 09/27/21 0019 09/27/21 0545 09/27/21 0700  BP: 90/61 (!) 88/64 101/71 (!) 84/58  Pulse: 79 67 73 81  Resp: 18 16 19  (!) 21   Temp: 98.5 F (36.9 C) 98.5 F (36.9 C) 98.2 F (36.8 C) 98.3 F (36.8 C)  TempSrc:  Oral Oral Oral  SpO2:  98% 98% 99%  Weight:   48 kg   Height:        Intake/Output Summary (Last 24 hours) at 09/27/2021 0816 Last data filed at 09/27/2021 0600 Gross per 24 hour  Intake 2378 ml  Output 2500 ml  Net -122 ml    Filed Weights   09/25/21 0456 09/26/21 0421 09/27/21 0545  Weight: 47.8 kg 48.6 kg 48 kg    Examination:  General: Cachectic appearing gentleman resting in bed no acute distress.  Core track NG tube in left nare intact Lungs:  Clear to auscultate bilaterally without rhonchi, wheeze, or rales. Heart:  Regular rate and rhythm.  Without murmurs, rubs, or gallops. Abdomen:  Soft, nontender, nondistended.  Without guarding or rebound. Musculoskeletal/extremities: Clear muscle wasting, gaunt appearance of both torso and all 4 limbs.  Ribs/clavicle clearly visible. Strength 3/5 globally.   Data Reviewed: I have personally reviewed following labs and imaging studies  CBC: Recent Labs  Lab 09/22/21 1643 09/22/21 1758 09/23/21 0214 09/24/21 0314 09/25/21 0511 09/26/21 0200 09/27/21 0139  WBC 9.8  --  9.0 6.2 4.8 5.5 5.4  NEUTROABS 7.5  --   --   --   --   --   --   HGB 11.7*   < > 10.8* 9.9* 10.1* 9.6* 9.5*  HCT 35.0*   < > 32.5* 28.6* 29.3* 28.0* 27.6*  MCV 98.3  --  97.3 95.7 94.8 94.6 94.2  PLT 174  --  158 120* 125* 124* 136*   < > = values in this interval not displayed.    Basic Metabolic Panel: Recent Labs  Lab 09/23/21 0214 09/24/21 0314 09/24/21 1542 09/25/21 0511 09/25/21 1640 09/26/21 0200 09/27/21 0139  NA 140 139  --  134*  --  135 130*  K 3.5 2.9*  --  3.2*  --  4.0 3.8  CL 107 107  --  102  --  106 102  CO2 23 26  --  25  --  23 24  GLUCOSE 251* 90  --  133*  --  175* 83  BUN 19 12  --  12  --  14 11  CREATININE 0.77 0.55*  --  0.45*  --  0.48* 0.50*  CALCIUM 8.9 8.2*  --  8.0*  --  7.8* 7.7*  MG 1.7  --  1.3* 1.5* 2.2  --   --   PHOS  2.1*  --  3.2 3.1 2.6  --   --     GFR: Estimated Creatinine Clearance: 75.8 mL/min (A) (by C-G formula based on SCr of 0.5 mg/dL (L)). Liver Function Tests: Recent Labs  Lab 09/23/21 0214 09/24/21 0314 09/25/21 0511 09/26/21 0200 09/27/21 0139  AST 26 26 29 30  37  ALT 35 26 23 21 21   ALKPHOS 130* 106 97 100 106  BILITOT 0.7 0.8 0.8 0.6 0.6  PROT 6.8 5.8* 6.0* 5.5* 5.7*  ALBUMIN 2.5* 2.0* 2.0* 1.9* 1.9*    No results for input(s): "LIPASE", "AMYLASE" in the last 168 hours. No results for input(s): "AMMONIA" in the last 168 hours. Coagulation Profile: Recent Labs  Lab 09/22/21 1643  INR 1.3*    Cardiac Enzymes: Recent Labs  Lab 09/22/21 1643  CKTOTAL 16*    BNP (last 3 results) No results for input(s): "PROBNP" in the last 8760 hours. HbA1C: No results for input(s): "HGBA1C" in the last 72 hours. CBG: Recent Labs  Lab 09/26/21 1934 09/27/21 0017 09/27/21 0527 09/27/21 0641 09/27/21 0725  GLUCAP 206* 82 72 81 99    Lipid Profile: No results for input(s): "CHOL", "HDL", "LDLCALC", "TRIG", "CHOLHDL", "LDLDIRECT" in the last 72 hours. Thyroid Function Tests: No results for input(s): "TSH", "T4TOTAL", "FREET4", "T3FREE", "THYROIDAB" in the last 72 hours. Anemia Panel: No results for input(s): "VITAMINB12", "FOLATE", "FERRITIN", "TIBC", "IRON", "RETICCTPCT" in the last 72 hours. Sepsis Labs: Recent Labs  Lab 09/22/21 1643 09/23/21 0214 09/23/21 0737  PROCALCITON  --  2.09  2.08  --   LATICACIDVEN 1.3 2.3* 1.0     Recent Results (from the past 240 hour(s))  Culture, blood (Routine x 2)     Status: None   Collection Time: 09/22/21  4:43 PM   Specimen: BLOOD RIGHT ARM  Result Value Ref Range Status   Specimen Description BLOOD RIGHT ARM  Final   Special Requests   Final    BOTTLES DRAWN AEROBIC AND ANAEROBIC Blood Culture adequate volume   Culture   Final    NO GROWTH 5 DAYS Performed at [facility], [place] [address], [place], [place]  [address]    Report Status 09/27/2021 FINAL  Final  Culture, blood (Routine x 2)     Status: None   Collection Time: 09/22/21  4:48 PM   Specimen: BLOOD  Result Value Ref Range Status   Specimen Description BLOOD SITE NOT SPECIFIED  Final   Special Requests   Final    BOTTLES DRAWN AEROBIC AND ANAEROBIC Blood Culture adequate  volume   Culture   Final    NO GROWTH 5 DAYS Performed at [facility], [place] [address] [place], [place] [address]    Report Status 09/27/2021 FINAL  Final  Resp Panel by RT-PCR (Flu A&B, Covid) Anterior Nasal Swab     Status: None   Collection Time: 09/22/21  4:55 PM   Specimen: Anterior Nasal Swab  Result Value Ref Range Status   SARS Coronavirus 2 by RT PCR NEGATIVE NEGATIVE Final    Comment: (NOTE) SARS-CoV-2 target nucleic acids are NOT DETECTED.  The SARS-CoV-2 RNA is generally detectable in upper respiratory specimens during the acute phase of infection. The lowest concentration of SARS-CoV-2 viral copies this assay can detect is 138 copies/mL. A negative result does not preclude SARS-Cov-2 infection and should not be used as the sole basis for treatment or other patient management decisions. A negative result may occur with  improper specimen collection/handling, submission of specimen other than nasopharyngeal swab, presence of viral mutation(s) within the areas targeted by this assay, and inadequate number of viral copies(<138 copies/mL). A negative result must be combined with clinical observations, patient history, and epidemiological information. The expected result is Negative.  Fact Sheet for Patients:  EntrepreneurPulse.com.au  Fact Sheet for Healthcare Providers:  IncredibleEmployment.be  This test is no t yet approved or cleared by the [place] FDA and  has been authorized for detection and/or diagnosis of SARS-CoV-2 by FDA under an Emergency Use Authorization (EUA). This EUA will remain  in  effect (meaning this test can be used) for the duration of the COVID-19 declaration under Section 564(b)(1) of the Act, 21 U.S.C.section 360bbb-3(b)(1), unless the authorization is terminated  or revoked sooner.       Influenza A by PCR NEGATIVE NEGATIVE Final   Influenza B by PCR NEGATIVE NEGATIVE Final    Comment: (NOTE) The Xpert Xpress SARS-CoV-2/FLU/RSV plus assay is intended as an aid in the diagnosis of influenza from Nasopharyngeal swab specimens and should not be used as a sole basis for treatment. Nasal washings and aspirates are unacceptable for Xpert Xpress SARS-CoV-2/FLU/RSV testing.  Fact Sheet for Patients: EntrepreneurPulse.com.au  Fact Sheet for Healthcare Providers: IncredibleEmployment.be  This test is not yet approved or cleared by the [place] FDA and has been authorized for detection and/or diagnosis of SARS-CoV-2 by FDA under an Emergency Use Authorization (EUA). This EUA will remain in effect (meaning this test can be used) for the duration of the COVID-19 declaration under Section 564(b)(1) of the Act, 21 U.S.C. section 360bbb-3(b)(1), unless the authorization is terminated or revoked.  Performed at [facility], [place] [address], [place], [place] [address]   MRSA Next Gen by PCR, Nasal     Status: Abnormal   Collection Time: 09/23/21  1:00 AM  Result Value Ref Range Status   MRSA by PCR Next Gen DETECTED (A) NOT DETECTED Final    Comment: RESULT CALLED TO, READ BACK BY AND VERIFIED WITH: RN SIERRA DONNELL 09/23/21@2 :25 BY TW (NOTE) The GeneXpert MRSA Assay (FDA approved for NASAL specimens only), is one component of a comprehensive MRSA colonization surveillance program. It is not intended to diagnose MRSA infection nor to guide or monitor treatment for MRSA infections. Test performance is not FDA approved in patients less than 48 years old. Performed at [facility], [place] [address],  [place],  [address]   Urine Culture     Status: None   Collection Time: 09/23/21  5:30 AM  Specimen: In/Out Cath Urine  Result Value Ref Range Status   Specimen Description IN/OUT CATH URINE  Final   Special Requests NONE  Final   Culture   Final    NO GROWTH Performed at [facility], [address], [place], [place] [address]    Report Status 09/24/2021 FINAL  Final         Radiology Studies: DG CHEST PORT 1 VIEW  Result Date: 09/26/2021 CLINICAL DATA:  Pneumonia. EXAM: PORTABLE CHEST 1 VIEW COMPARISON:  September 22, 2021. FINDINGS: EKG leads project over the chest. Feeding tube courses through in off the field of the radiograph. Cardiomediastinal contours and hilar structures are stable. Hazy interstitial prominence on the LEFT with subtle patchy airspace disease is not substantially changed. No lobar consolidation. No visible pneumothorax. On limited assessment there is no acute skeletal finding. IMPRESSION: Stable chest x-ray aside from interval placement of a feeding tube which is off the field of view but extends into the upper abdomen. Hazy interstitial prominence on the LEFT with subtle patchy airspace disease. Electronically Signed   By: Donzetta Kohut M.D.   On: 09/26/2021 08:42        Scheduled Meds:  enoxaparin (LOVENOX) injection  40 mg Subcutaneous Q24H   feeding supplement (OSMOLITE 1.5 CAL)  237 mL Per Tube 5 X Daily   feeding supplement (PROSource TF)  45 mL Per Tube BID   free water  150 mL Per Tube Q4H   insulin aspart  0-9 Units Subcutaneous Q4H   insulin glargine-yfgn  10 Units Subcutaneous QHS   mupirocin ointment  1 Application Nasal BID   sodium chloride flush  3 mL Intravenous Q12H   Continuous Infusions:  ceFEPime (MAXIPIME) IV 2 g (09/27/21 0549)   vancomycin 1,250 mg (09/26/21 1546)     LOS: 5 days   Time spent:  Azucena Fallen, DO [facility]  If 7PM-7AM, please contact night-coverage [email or web address]  09/27/2021,  8:16 AM

## 2021-09-28 DIAGNOSIS — Z7189 Other specified counseling: Secondary | ICD-10-CM

## 2021-09-28 DIAGNOSIS — Z515 Encounter for palliative care: Secondary | ICD-10-CM

## 2021-09-28 DIAGNOSIS — Z66 Do not resuscitate: Secondary | ICD-10-CM

## 2021-09-28 DIAGNOSIS — R1310 Dysphagia, unspecified: Secondary | ICD-10-CM

## 2021-09-28 DIAGNOSIS — J189 Pneumonia, unspecified organism: Secondary | ICD-10-CM | POA: Diagnosis not present

## 2021-09-28 LAB — GLUCOSE, CAPILLARY
Glucose-Capillary: 116 mg/dL — ABNORMAL HIGH (ref 70–99)
Glucose-Capillary: 129 mg/dL — ABNORMAL HIGH (ref 70–99)
Glucose-Capillary: 201 mg/dL — ABNORMAL HIGH (ref 70–99)
Glucose-Capillary: 234 mg/dL — ABNORMAL HIGH (ref 70–99)
Glucose-Capillary: 270 mg/dL — ABNORMAL HIGH (ref 70–99)
Glucose-Capillary: 474 mg/dL — ABNORMAL HIGH (ref 70–99)

## 2021-09-28 LAB — CBC
HCT: 28.2 % — ABNORMAL LOW (ref 39.0–52.0)
Hemoglobin: 10 g/dL — ABNORMAL LOW (ref 13.0–17.0)
MCH: 33.2 pg (ref 26.0–34.0)
MCHC: 35.5 g/dL (ref 30.0–36.0)
MCV: 93.7 fL (ref 80.0–100.0)
Platelets: 141 10*3/uL — ABNORMAL LOW (ref 150–400)
RBC: 3.01 MIL/uL — ABNORMAL LOW (ref 4.22–5.81)
RDW: 13.4 % (ref 11.5–15.5)
WBC: 5.8 10*3/uL (ref 4.0–10.5)
nRBC: 0 % (ref 0.0–0.2)

## 2021-09-28 LAB — COMPREHENSIVE METABOLIC PANEL
ALT: 23 U/L (ref 0–44)
AST: 32 U/L (ref 15–41)
Albumin: 2 g/dL — ABNORMAL LOW (ref 3.5–5.0)
Alkaline Phosphatase: 108 U/L (ref 38–126)
Anion gap: 6 (ref 5–15)
BUN: 13 mg/dL (ref 6–20)
CO2: 26 mmol/L (ref 22–32)
Calcium: 8.1 mg/dL — ABNORMAL LOW (ref 8.9–10.3)
Chloride: 102 mmol/L (ref 98–111)
Creatinine, Ser: 0.52 mg/dL — ABNORMAL LOW (ref 0.61–1.24)
GFR, Estimated: >60 mL/min (ref >60)
Glucose, Bld: 256 mg/dL — ABNORMAL HIGH (ref 70–99)
Potassium: 3.8 mmol/L (ref 3.5–5.1)
Sodium: 134 mmol/L — ABNORMAL LOW (ref 135–145)
Total Bilirubin: 0.6 mg/dL (ref 0.3–1.2)
Total Protein: 6.1 g/dL — ABNORMAL LOW (ref 6.5–8.1)

## 2021-09-28 LAB — MAGNESIUM: Magnesium: 1.8 mg/dL (ref 1.7–2.4)

## 2021-09-28 LAB — PHOSPHORUS: Phosphorus: 2.8 mg/dL (ref 2.5–4.6)

## 2021-09-28 MED ORDER — MIDODRINE HCL 5 MG PO TABS
5.0000 mg | ORAL_TABLET | Freq: Three times a day (TID) | ORAL | Status: DC
  Administered 2021-09-28 – 2021-10-03 (×16): 5 mg via NASOGASTRIC
  Filled 2021-09-28 (×16): qty 1

## 2021-09-28 MED ORDER — SODIUM CHLORIDE 0.9 % IV BOLUS
250.0000 mL | Freq: Once | INTRAVENOUS | Status: AC
  Administered 2021-09-28: 250 mL via INTRAVENOUS

## 2021-09-28 NOTE — Progress Notes (Signed)
2120-Pt vomited bolus tube feeding received around 2000 by dayshift. Pt's bed at 30 degrees as well and pt upright.  Upon assessment pt lung fields clear with no coughing noted at this time. Pt given Zofran 4 mg IVP. Dr. Imogene Burn paged about event as well.   2141- Callback received from Dr. Imogene Burn details about event discussed with MD. Informed MD that pt received Zofran and that next bolus feeding will be around 1-2 am. No new orders received at this time.

## 2021-09-28 NOTE — Consult Note (Signed)
Consultation Note Date: 09/28/2021   Patient Name: Antonio [name]  DOB: 1971-01-25  MRN: [id number]  Age / Sex: 51 y.o., male  PCP: [name], DO Referring Physician: Antonieta Pert, MD  Reason for Consultation: Establishing goals of care  HPI/Patient Profile: 51 y.o. 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  Patient diagnosed with severe sepsis presumed to be from necrotizing multifocal pneumonia.  Patient also with severe dysphagia and severe malnutrition.  PMT consulted to discuss goals of care.  Clinical Assessment and Goals of Care: I have reviewed medical records including EPIC notes, labs and imaging, received report from RN, dietitian, and Dr. Lupita Leash, assessed the patient and then met with patient to discuss diagnosis prognosis, GOC, EOL wishes, disposition and options.  I introduced Palliative Medicine as specialized medical care for people living with serious illness. It focuses on providing relief from the symptoms and stress of a serious illness. The goal is to improve quality of life for both the patient and the family.  Patient tells me prior to hospitalization he was living with a friend.  He tells me he was needing some assistance with ADLs.  He tells me he had a good appetite.  Tells me his brother Antonio [name] assisted him some at home.   We discussed patient's current illness and what it means in the larger context of patient's on-going co-morbidities.  Natural disease trajectory and expectations at EOL were discussed.  We discussed his malnutrition and his severe dysphagia.  We discussed his aspiration pneumonia.  We discussed expectation of aspiration with  noncompliance with diet recommendations.  We also discussed concern of worsening nutrition without feeding tube.  I attempted to elicit values and goals of care important to the patient.  Patient is clear that his main goal is to have the feeding tube removed as it is uncomfortable for him.  He tells me he wants to eat and drink as he pleases.  He tells me he did plan to follow SLP recommendations such as sitting up and eating slow small bites but he does want to eat what ever he requests.  I explained to him the recommendation for thickened liquids but he tells me he only wants thin liquids.  There have been some concerns about patient's capacity to make decisions.  The capacity to make one's own decisions is fundamental to the ethical principal of respect for autonomy. The main determinant of capacity is cognition. Capacity describes a person's ability to make a decision. Capacity refers to the ability to utilize information about an illness and proposed treatment options to make a choice that is congruent with one's own values and preferences.  There are four generally accepted decision-making abilities that constitute capacity: Understanding, expressing a choice, appreciation and reasoning. Understanding is to know the meaning of the information given. Individuals should be able to clearly communicate a choice when presented with multiple treatment options.  Knowing facts essential to make a decision, but applying those facts to one's own life is essential to make the decision authentic. The ability to appreciate facts refers to one's ability to recognize how facts are relevant to oneself, and how it affects one now and could affect one in the future shows that one appreciates one's diagnosis, prognosis and treatment options. The  ability to reason is the ability to compare options and to infer consequences of choice.  Today patient was able to tell me in his own words information regarding aspiration  pneumonia and dysphagia and  relation to this hospital stay.  He explained to me his desire to eat or drink as he pleases and could explain potential consequences of aspiration could even lead to death.  He tells me he accepts this risk and wants to pursue his desire for food and drink as he pleases.  We also discussed potential risk of malnutrition without feeding tube he tells me he accepts this risk as well.  We talked about how to proceed with his care if he declines further following p.o. intake.  We discussed potential need for hospice involvement and he tells me he would accept hospice.  He tells me he would like to allow his body to go through the natural disease process and not interfere with means such as artificial nutrition.  He understands this will likely lead to his decline and accepts this.  Discussed with patient the importance of continued conversation with family and the medical providers regarding overall plan of care and treatment options, ensuring decisions are within the context of the patients values and GOCs.    I asked patient if I could call his brother and he tells me I can have also tells me his brother is not very involved in medical decision making.  RN confirmed this as well.  I attempted to call brother at 2 numbers listed with no answer -voicemails with callback number left at each number.  During our conversation patient was able to express an understanding of the situation and available choices to him he could relate how each choice would affect him and his future and he could also explain consequences of choices.  Due to this I do feel patient has the capacity to make decision about his diet and feeding tube at this time.  Questions and concerns were addressed.  Primary Decision Maker PATIENT    SUMMARY OF RECOMMENDATIONS   -During my conversation with patient surrounding his dysphagia, feeding tube, and preference for liberation in diet he demonstrated capacity  to make decision about this -we will proceed with liberating his diet to a soft diet and will start with honey thickened liquids.  -Please note patient did make the request for no restriction in liquids and prefers thin liquids so I have given nurse the freedom to liberalize diet if he absolutely refuses the honey thickened liquids. -Discontinue cortrak and tube feeding -PMT will follow closely, patient aware that we anticipate a decline and hospice care may become appropriate and he is open to this -Patient's only family member that he mentioned or is listed in his chart is a brother -I have attempted to call him x2 to explain situation but have not received a call back  Code Status/Advance Care Planning: DNR     Primary Diagnoses: Present on Admission:  Multifocal pneumonia  MDD (major depressive disorder), recurrent episode, moderate ([place])  Polysubstance abuse ([place])  Uncontrolled type 2 diabetes mellitus with hyperglycemia, without long-term current use of insulin ([facility])  Pleural effusion, left  Protein-calorie malnutrition, severe ([place])   I have reviewed the medical record, interviewed the patient and family, and examined the patient. The following aspects are pertinent.  Past Medical History:  Diagnosis Date   Alcoholic hepatitis with ascites    liver bx march 2013, no cirrhosis.  Hep b/c negative  Alcoholism /alcohol abuse 05/2011   Ascites    3 to 4 paracentesis from 2/28 to 06/10/11 at [facility] totalling  19 liters.   DVT (deep venous thrombosis) ([place]) 05/2011   Hepatitis    Pulmonary embolism ([place]) 05/2011   sent home on Xarelto.   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    He is functionally illiterate, reading skills are poor.    Social Determinants of Health   Financial Resource Strain: Not on file  Food Insecurity: Not on file  Transportation Needs: Not on file  Physical Activity: Not on file  Stress: Not on file  Social Connections: Not on file   History reviewed. No pertinent family history. Scheduled Meds:  enoxaparin (LOVENOX) injection  40 mg Subcutaneous Q24H   free water  100 mL Per Tube 5 X Daily   insulin aspart  0-9 Units Subcutaneous Q4H   insulin glargine-yfgn  10 Units Subcutaneous QHS   midodrine  5 mg Per NG tube TID WC   sodium chloride flush  3 mL Intravenous Q12H   Continuous Infusions: PRN Meds:.acetaminophen **OR** acetaminophen, guaiFENesin, ibuprofen, ondansetron **OR** ondansetron (ZOFRAN) IV, senna-docusate Allergies  Allergen Reactions   Acetaminophen     Other reaction(s): Other (See Comments) Liver issues   Review of Systems  Constitutional:  Positive for activity change and fatigue.  Neurological:  Positive for weakness.    Physical Exam Constitutional:      General: He is not in acute distress.    Appearance: He is ill-appearing.     Comments: Cachectic  Pulmonary:     Effort: Pulmonary effort is normal.  Skin:    General: Skin is warm and dry.  Neurological:     Mental Status: He is alert and oriented to person, place, and time.  Psychiatric:        Attention and Perception: Attention normal.        Behavior: Behavior is cooperative.     Vital Signs: BP 90/66   Pulse 78   Temp 98.3 F (36.8 C) (Oral)   Resp 18   Ht 5' 9"  (1.753 m)   Wt 43.6 kg   SpO2 98%   BMI 14.19 kg/m  Pain  Scale: 0-10   Pain Score: 4    SpO2: SpO2: 98 % O2 Device:SpO2: 98 % O2 Flow Rate: .O2 Flow Rate (L/min): 3 L/min  IO: Intake/output summary:  Intake/Output Summary (Last 24 hours) at 09/28/2021 1641 Last data filed at 09/28/2021 1432 Gross per 24 hour  Intake 10 ml  Output 650 ml  Net -640 ml    LBM: Last BM Date : 09/27/21 Baseline Weight: Weight: 45.4 kg Most recent weight: Weight: 43.6 kg     Palliative Assessment/Data:  PPS 40%     *Please note that this is a verbal dictation therefore any spelling or grammatical errors are due to the "[employer]" system interpretation.  Juel Burrow, DNP, AGNP-C Palliative Medicine Team [phone] Pager: [phone]

## 2021-09-29 DIAGNOSIS — R1310 Dysphagia, unspecified: Secondary | ICD-10-CM | POA: Diagnosis not present

## 2021-09-29 DIAGNOSIS — Z7189 Other specified counseling: Secondary | ICD-10-CM | POA: Diagnosis not present

## 2021-09-29 DIAGNOSIS — E43 Unspecified severe protein-calorie malnutrition: Secondary | ICD-10-CM

## 2021-09-29 DIAGNOSIS — J189 Pneumonia, unspecified organism: Secondary | ICD-10-CM | POA: Diagnosis not present

## 2021-09-29 LAB — GLUCOSE, CAPILLARY
Glucose-Capillary: 169 mg/dL — ABNORMAL HIGH (ref 70–99)
Glucose-Capillary: 246 mg/dL — ABNORMAL HIGH (ref 70–99)
Glucose-Capillary: 254 mg/dL — ABNORMAL HIGH (ref 70–99)
Glucose-Capillary: 284 mg/dL — ABNORMAL HIGH (ref 70–99)
Glucose-Capillary: 329 mg/dL — ABNORMAL HIGH (ref 70–99)
Glucose-Capillary: 380 mg/dL — ABNORMAL HIGH (ref 70–99)

## 2021-09-29 LAB — PHOSPHORUS: Phosphorus: 2.8 mg/dL (ref 2.5–4.6)

## 2021-09-29 LAB — MAGNESIUM: Magnesium: 1.8 mg/dL (ref 1.7–2.4)

## 2021-09-29 LAB — CREATININE, SERUM
Creatinine, Ser: 0.61 mg/dL (ref 0.61–1.24)
GFR, Estimated: >60 mL/min (ref >60)

## 2021-09-29 MED ORDER — ORAL CARE MOUTH RINSE: 15.0000 mL | OROMUCOSAL | Status: DC | PRN

## 2021-09-29 MED ORDER — SODIUM CHLORIDE 0.9 % IV BOLUS
250.0000 mL | Freq: Once | INTRAVENOUS | Status: AC
Start: 2021-09-29 — End: 2021-09-29
  Administered 2021-09-29: 250 mL via INTRAVENOUS

## 2021-09-30 DIAGNOSIS — J189 Pneumonia, unspecified organism: Secondary | ICD-10-CM | POA: Diagnosis not present

## 2021-09-30 LAB — GLUCOSE, CAPILLARY
Glucose-Capillary: 175 mg/dL — ABNORMAL HIGH (ref 70–99)
Glucose-Capillary: 198 mg/dL — ABNORMAL HIGH (ref 70–99)
Glucose-Capillary: 251 mg/dL — ABNORMAL HIGH (ref 70–99)
Glucose-Capillary: 396 mg/dL — ABNORMAL HIGH (ref 70–99)
Glucose-Capillary: 384 mg/dL — ABNORMAL HIGH (ref 70–99)
Glucose-Capillary: 263 mg/dL — ABNORMAL HIGH (ref 70–99)

## 2021-09-30 LAB — MAGNESIUM: Magnesium: 1.7 mg/dL (ref 1.7–2.4)

## 2021-09-30 LAB — PHOSPHORUS: Phosphorus: 2.8 mg/dL (ref 2.5–4.6)

## 2021-09-30 MED ORDER — INSULIN GLARGINE-YFGN 100 UNIT/ML ~~LOC~~ SOLN
14.0000 [IU] | Freq: Every day | SUBCUTANEOUS | Status: DC
  Administered 2021-09-30: 14 [IU] via SUBCUTANEOUS
  Filled 2021-09-30 (×3): qty 0.14

## 2021-10-01 DIAGNOSIS — F331 Major depressive disorder, recurrent, moderate: Secondary | ICD-10-CM

## 2021-10-01 DIAGNOSIS — F1910 Other psychoactive substance abuse, uncomplicated: Secondary | ICD-10-CM | POA: Diagnosis not present

## 2021-10-01 DIAGNOSIS — E1165 Type 2 diabetes mellitus with hyperglycemia: Secondary | ICD-10-CM

## 2021-10-01 DIAGNOSIS — J90 Pleural effusion, not elsewhere classified: Secondary | ICD-10-CM | POA: Diagnosis not present

## 2021-10-01 DIAGNOSIS — J189 Pneumonia, unspecified organism: Secondary | ICD-10-CM | POA: Diagnosis not present

## 2021-10-01 LAB — BASIC METABOLIC PANEL
Anion gap: 12 (ref 5–15)
BUN: 12 mg/dL (ref 6–20)
CO2: 27 mmol/L (ref 22–32)
Calcium: 8.4 mg/dL — ABNORMAL LOW (ref 8.9–10.3)
Chloride: 99 mmol/L (ref 98–111)
Creatinine, Ser: 0.74 mg/dL (ref 0.61–1.24)
GFR, Estimated: >60 mL/min (ref >60)
Glucose, Bld: 346 mg/dL — ABNORMAL HIGH (ref 70–99)
Potassium: 3 mmol/L — ABNORMAL LOW (ref 3.5–5.1)
Sodium: 138 mmol/L (ref 135–145)

## 2021-10-01 LAB — CBC
HCT: 26.4 % — ABNORMAL LOW (ref 39.0–52.0)
Hemoglobin: 8.8 g/dL — ABNORMAL LOW (ref 13.0–17.0)
MCH: 32.2 pg (ref 26.0–34.0)
MCHC: 33.3 g/dL (ref 30.0–36.0)
MCV: 96.7 fL (ref 80.0–100.0)
Platelets: 170 10*3/uL (ref 150–400)
RBC: 2.73 MIL/uL — ABNORMAL LOW (ref 4.22–5.81)
RDW: 13.7 % (ref 11.5–15.5)
WBC: 5.9 10*3/uL (ref 4.0–10.5)
nRBC: 0 % (ref 0.0–0.2)

## 2021-10-01 LAB — GLUCOSE, CAPILLARY
Glucose-Capillary: 184 mg/dL — ABNORMAL HIGH (ref 70–99)
Glucose-Capillary: 199 mg/dL — ABNORMAL HIGH (ref 70–99)
Glucose-Capillary: 257 mg/dL — ABNORMAL HIGH (ref 70–99)
Glucose-Capillary: 280 mg/dL — ABNORMAL HIGH (ref 70–99)
Glucose-Capillary: 291 mg/dL — ABNORMAL HIGH (ref 70–99)
Glucose-Capillary: 330 mg/dL — ABNORMAL HIGH (ref 70–99)
Glucose-Capillary: 423 mg/dL — ABNORMAL HIGH (ref 70–99)

## 2021-10-01 LAB — MAGNESIUM: Magnesium: 1.5 mg/dL — ABNORMAL LOW (ref 1.7–2.4)

## 2021-10-01 MED ORDER — POTASSIUM CHLORIDE 10 MEQ/100ML IV SOLN
10.0000 meq | INTRAVENOUS | Status: DC
  Filled 2021-10-01: qty 100

## 2021-10-01 MED ORDER — POTASSIUM CHLORIDE 10 MEQ/100ML IV SOLN
10.0000 meq | INTRAVENOUS | Status: AC
  Administered 2021-10-01 (×4): 10 meq via INTRAVENOUS
  Filled 2021-10-01 (×4): qty 100

## 2021-10-01 MED ORDER — INSULIN ASPART 100 UNIT/ML IJ SOLN
0.0000 [IU] | Freq: Every day | INTRAMUSCULAR | Status: DC
  Administered 2021-10-02: 3 [IU] via SUBCUTANEOUS
  Administered 2021-10-04: 5 [IU] via SUBCUTANEOUS
  Administered 2021-10-07: 2 [IU] via SUBCUTANEOUS

## 2021-10-01 MED ORDER — INSULIN ASPART 100 UNIT/ML IJ SOLN
0.0000 [IU] | Freq: Three times a day (TID) | INTRAMUSCULAR | Status: DC
  Administered 2021-10-01: 15 [IU] via SUBCUTANEOUS
  Administered 2021-10-01: 8 [IU] via SUBCUTANEOUS
  Administered 2021-10-02: 2 [IU] via SUBCUTANEOUS
  Administered 2021-10-02: 3 [IU] via SUBCUTANEOUS
  Administered 2021-10-02: 15 [IU] via SUBCUTANEOUS
  Administered 2021-10-03: 2 [IU] via SUBCUTANEOUS
  Administered 2021-10-03: 15 [IU] via SUBCUTANEOUS
  Administered 2021-10-03: 3 [IU] via SUBCUTANEOUS
  Administered 2021-10-04 (×2): 15 [IU] via SUBCUTANEOUS
  Administered 2021-10-04: 5 [IU] via SUBCUTANEOUS
  Administered 2021-10-05: 3 [IU] via SUBCUTANEOUS
  Administered 2021-10-05: 11 [IU] via SUBCUTANEOUS
  Administered 2021-10-06 (×2): 2 [IU] via SUBCUTANEOUS
  Administered 2021-10-07: 5 [IU] via SUBCUTANEOUS
  Administered 2021-10-07: 3 [IU] via SUBCUTANEOUS
  Administered 2021-10-07: 8 [IU] via SUBCUTANEOUS

## 2021-10-01 MED ORDER — MAGNESIUM SULFATE 2 GM/50ML IV SOLN
2.0000 g | Freq: Once | INTRAVENOUS | Status: AC
  Administered 2021-10-01: 2 g via INTRAVENOUS
  Filled 2021-10-01: qty 50

## 2021-10-01 MED ORDER — INSULIN ASPART 100 UNIT/ML IJ SOLN
6.0000 [IU] | Freq: Three times a day (TID) | INTRAMUSCULAR | Status: DC
  Administered 2021-10-01 – 2021-10-04 (×8): 6 [IU] via SUBCUTANEOUS

## 2021-10-01 MED ORDER — INSULIN GLARGINE-YFGN 100 UNIT/ML ~~LOC~~ SOLN
20.0000 [IU] | Freq: Every day | SUBCUTANEOUS | Status: DC
  Administered 2021-10-01 – 2021-10-02 (×2): 20 [IU] via SUBCUTANEOUS
  Filled 2021-10-01 (×4): qty 0.2

## 2021-10-01 MED ORDER — NEPRO/CARBSTEADY PO LIQD
237.0000 mL | Freq: Three times a day (TID) | ORAL | Status: DC
  Administered 2021-10-04 – 2021-10-05 (×2): 237 mL via ORAL

## 2021-10-01 NOTE — Progress Notes (Signed)
Speech Language Pathology Treatment: Dysphagia  Patient Details Name: Antonio [name] MRN: [id number] DOB: 1970-11-17 Today's Date: 10/01/2021 Time: 4098-1191 SLP Time Calculation (min) (ACUTE ONLY): 10 min  Assessment / Plan / Recommendation Clinical Impression  Pt continues to enjoy PO, though he says hes not getting enough to eat. SLP reinforced chin tuck. Pt feels he is doing a chin tuck when he is not really inclining head much at all. Pt improved with visual cues. Regardless, pt had no coughing or wet vocal quality today despite liberalization to thin liquids per his preference. Reiterated that pt likely also silently aspirating at times, but careful behaviors help. Pt verbalized understanding.   HPI HPI: Antonio [name] is a chronically very ill 51 y.o. homeless male admitted with necrotizing pna, dysphagia.  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 Pt was noted to choke with liquids in the ED, hence swallow evaluation ordered. 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 Negative for malignancy." and sinus contents resulting mild chronic inflammation.   MBS on 05/11/21 "Patient presents with a primary pharyngeal dysphagia at high risk for aspiration of liquids. Chin tuck was successful in eliminating aspiration of thin and nectar thick liquids. Oral swallow characterized by decreased bolus manipulation and mastication question secondary to large bolus and edentulous state with solids. Patient had premature spillage of thin liquids, nectar liquids, and honey thick  liquids to the level of the piriform sinus. Chin tuck did decrease level of spillage to vallecular space with nectar thick liquid trials."      SLP Plan  Continue with current plan of care      Recommendations for follow up therapy are one component of a multi-disciplinary discharge planning process, led by the attending physician.  Recommendations may be updated based on patient status, additional functional criteria and insurance authorization.    Recommendations  Diet recommendations: Dysphagia 3 (mechanical soft);Thin liquid Liquids provided via: Cup;Straw Medication Administration: Crushed with puree Supervision: Full supervision/cueing for compensatory strategies Compensations: Chin tuck;Multiple dry swallows after each bite/sip;Slow rate;Small sips/bites Postural Changes and/or Swallow Maneuvers: Seated upright 90 degrees                Oral Care Recommendations: Oral care BID Follow Up Recommendations: Home health SLP SLP Visit Diagnosis: Dysphagia, oropharyngeal phase (R13.12) Plan: Continue with current plan of care           [name], [name]  10/01/2021, 3:27 PM

## 2021-10-02 DIAGNOSIS — F331 Major depressive disorder, recurrent, moderate: Secondary | ICD-10-CM | POA: Diagnosis not present

## 2021-10-02 DIAGNOSIS — Z515 Encounter for palliative care: Secondary | ICD-10-CM | POA: Diagnosis not present

## 2021-10-02 DIAGNOSIS — Z7189 Other specified counseling: Secondary | ICD-10-CM | POA: Diagnosis not present

## 2021-10-02 DIAGNOSIS — F1910 Other psychoactive substance abuse, uncomplicated: Secondary | ICD-10-CM | POA: Diagnosis not present

## 2021-10-02 DIAGNOSIS — J90 Pleural effusion, not elsewhere classified: Secondary | ICD-10-CM | POA: Diagnosis not present

## 2021-10-02 DIAGNOSIS — E43 Unspecified severe protein-calorie malnutrition: Secondary | ICD-10-CM | POA: Diagnosis not present

## 2021-10-02 DIAGNOSIS — J189 Pneumonia, unspecified organism: Secondary | ICD-10-CM | POA: Diagnosis not present

## 2021-10-02 LAB — COMPREHENSIVE METABOLIC PANEL
ALT: 29 U/L (ref 0–44)
AST: 39 U/L (ref 15–41)
Albumin: 1.9 g/dL — ABNORMAL LOW (ref 3.5–5.0)
Alkaline Phosphatase: 108 U/L (ref 38–126)
Anion gap: 6 (ref 5–15)
BUN: 12 mg/dL (ref 6–20)
CO2: 29 mmol/L (ref 22–32)
Calcium: 8.1 mg/dL — ABNORMAL LOW (ref 8.9–10.3)
Chloride: 100 mmol/L (ref 98–111)
Creatinine, Ser: 0.61 mg/dL (ref 0.61–1.24)
GFR, Estimated: >60 mL/min (ref >60)
Glucose, Bld: 292 mg/dL — ABNORMAL HIGH (ref 70–99)
Potassium: 4.1 mmol/L (ref 3.5–5.1)
Sodium: 135 mmol/L (ref 135–145)
Total Bilirubin: 0.5 mg/dL (ref 0.3–1.2)
Total Protein: 6.4 g/dL — ABNORMAL LOW (ref 6.5–8.1)

## 2021-10-02 LAB — GLUCOSE, CAPILLARY
Glucose-Capillary: 485 mg/dL — ABNORMAL HIGH (ref 70–99)
Glucose-Capillary: 172 mg/dL — ABNORMAL HIGH (ref 70–99)
Glucose-Capillary: 140 mg/dL — ABNORMAL HIGH (ref 70–99)
Glucose-Capillary: 264 mg/dL — ABNORMAL HIGH (ref 70–99)

## 2021-10-02 LAB — PHOSPHORUS: Phosphorus: 2.5 mg/dL (ref 2.5–4.6)

## 2021-10-02 LAB — MAGNESIUM: Magnesium: 1.7 mg/dL (ref 1.7–2.4)

## 2021-10-02 MED ORDER — MAGNESIUM SULFATE 2 GM/50ML IV SOLN: 2.0000 g | Freq: Once | INTRAVENOUS | Status: DC

## 2021-10-02 NOTE — Progress Notes (Signed)
Pt's CBG 485. MD paged via amion. Awaiting further orders

## 2021-10-02 NOTE — Progress Notes (Signed)
Progress Note  Patient: Antonio [name] LKG:[id number] DOB: 03-01-1971  DOA: 09/22/2021  DOS: 10/02/2021    Brief hospital course: 51 y.o. 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  He was initially planned for IV antibiotics through 08/13/2021.    In the interim, likely due to noncompliance with antibiotic therapy, patient's respiratory status and clinical condition deteriorated with worsening shortness of breath fatigue and pleuritic pain who ultimately presented to our facility on 09/22/2021 with imaging and work-up consistent with ongoing necrotizing pneumonia. Likely from chronic aspiration- IR consulted for thora but nothing to go after.  NGT placed due to profound speech/swallow failure monitoring for refeeding syndrome given his low BMI at 12-14 He's Aox4, requesting to discuss with hospice/palliative care.    Assessment and Plan: Severe sepsis presumed to be from pneumonia POA Necrotizing multifocal pneumonia: Concurrent pleural effusion-not amenable for IR thoracentesis Likely aspiration: -Patient completed vancomycin/cefepime.   -At this time afebrile although blood pressure has been soft but asymptomatic. -cont. on low-dose midodrine. Respiratory status stable on room air.     Uncontrolled IDT2DM with hyperglycemia. HbA1c 12.5%.  - CBGs continue severe elevation though this is primarily related to variable and severe dietary indiscretions. Augmenting insulins would risk hypoglycemia. Will continue glargine to 20u, mealtime 6u TIDWC + resistant SSI, HS coverage.    Dysphagia: Severe and not felt  to be able to safely swallow based on multiple SLP evaluations.  - Continuing dysphagia diet and aspiration precautions. The patient has been quite clear that he declines nonenteral nutritional options. We will therefore, anticipate continued aspiration and ongoing pneumonia which portends a poor prognosis. Palliative care has been involved in his care and, given our limitations in disposition options, I suspect his best/most reasonable option will be discharge to facility with palliative care following. However, he will certainly not consent to this and will likely end up going home with hospice care. I appreciate palliative care reengaging to confirm this is concordant with the patient's values. TOC aware and will pursue home hospice services.  Severe protein calorie malnutrition Cachexia BMI 14 : - Dietitian on board.  Appreciate help.  Watch for refeeding syndrome.     Hypokalemia: Resolved with replacement.   Hypomagnesemia: Supplement again today - Monitor with phos in AM given his risk for refeeding syndrome.  Polysubstance abuse: UDS positive for opiates, amphetamines during last 2 hospitalizations at [facility] in April he tested positive for dilaudid, oxymorphone, oxycodone and morphine. - Cessation counseling provided.    Hypotension: Monitor blood pressure closely.   -Continue midodrine   Anemia of chronic disease: monitor H&H transfuse for less than 7 g    Thrombocytopenia: Platelet at baseline.  Continue to monitor  Stage 2 medial perineal pressure injury: POA.  - Offload, keep dry.  Subjective: Continues coughing with most intake, wants to go home but has no assistance there. [employer] community that presented over the weekend reportedly told staff the patient was essentially homeless.   Objective: Vitals:   10/01/21 1531 10/01/21 2206 10/02/21 0806 10/02/21 1300  BP: 114/77 102/71 104/82 94/65  Pulse: (!) 103 65  69  Resp: 18 16  17   Temp: 98.2 F (36.8 C)  98.2 F  (36.8 C)  98.2 F (36.8 C)  TempSrc: Oral Oral  Oral  SpO2: 90% 100%  97%  Weight:      Height:       Gen: Cachectic male in no distress Pulm: Nonlabored breathing room air. Clear. CV: Regular rate and rhythm. No murmur, rub, or gallop. No JVD, no pitting dependent edema. GI: Abdomen soft, non-tender, non-distended, with normoactive bowel sounds.  Ext: Warm, emaciated without deformities Skin: No rashes, lesions or ulcers on visualized skin. Neuro: Alert and oriented. Stable dysarthria. No focal neurological deficits. Psych: Judgement and insight appear fair. Mood euthymic & affect congruent. Behavior is appropriate.    Data Personally reviewed: CBC: Recent Labs  Lab 09/26/21 0200 09/27/21 0139 09/28/21 0145 10/01/21 0053  WBC 5.5 5.4 5.8 5.9  HGB 9.6* 9.5* 10.0* 8.8*  HCT 28.0* 27.6* 28.2* 26.4*  MCV 94.6 94.2 93.7 96.7  PLT 124* 136* 141* 170   Basic Metabolic Panel: Recent Labs  Lab 09/26/21 0200 09/27/21 0139 09/28/21 0145 09/29/21 0430 09/30/21 0117 10/01/21 0053 10/02/21 0105  NA 135 130* 134*  --   --  138 135  K 4.0 3.8 3.8  --   --  3.0* 4.1  CL 106 102 102  --   --  99 100  CO2 23 24 26   --   --  27 29  GLUCOSE 175* 83 256*  --   --  346* 292*  BUN 14 11 13   --   --  12 12  CREATININE 0.48* 0.50* 0.52* 0.61  --  0.74 0.61  CALCIUM 7.8* 7.7* 8.1*  --   --  8.4* 8.1*  MG  --   --  1.8 1.8 1.7 1.5* 1.7  PHOS  --   --  2.8 2.8 2.8  --  2.5   GFR: Estimated Creatinine Clearance: 68.9 mL/min (by C-G formula based on SCr of 0.61 mg/dL). Liver Function Tests: Recent Labs  Lab 09/26/21 0200 09/27/21 0139 09/28/21 0145 10/02/21 0105  AST 30 37 32 39  ALT 21 21 23 29   ALKPHOS 100 106 108 108  BILITOT 0.6 0.6 0.6 0.5  PROT 5.5* 5.7* 6.1* 6.4*  ALBUMIN 1.9* 1.9* 2.0* 1.9*   No results for input(s): "LIPASE", "AMYLASE" in the last 168 hours. No results for input(s): "AMMONIA" in the last 168 hours. Coagulation Profile: No results for input(s): "INR",  "PROTIME" in the last 168 hours. Cardiac Enzymes: No results for input(s): "CKTOTAL", "CKMB", "CKMBINDEX", "TROPONINI" in the last 168 hours. BNP (last 3 results) No results for input(s): "PROBNP" in the last 8760 hours. HbA1C: No results for input(s): "HGBA1C" in the last 72 hours. CBG: Recent Labs  Lab 10/01/21 1820 10/01/21 2148 10/02/21 0718 10/02/21 1135 10/02/21 1526  GLUCAP 257* 184* 485* 172* 140*   Lipid Profile: No results for input(s): "CHOL", "HDL", "LDLCALC", "TRIG", "CHOLHDL", "LDLDIRECT" in the last 72 hours. Thyroid Function Tests: No results for input(s): "TSH", "T4TOTAL", "FREET4", "T3FREE", "THYROIDAB" in the last 72 hours. Anemia Panel: No results for input(s): "VITAMINB12", "FOLATE", "FERRITIN", "TIBC", "IRON", "RETICCTPCT" in the last 72 hours. Urine analysis:    Component Value Date/Time   COLORURINE YELLOW 09/23/2021 0530   APPEARANCEUR CLEAR 09/23/2021 0530   LABSPEC 1.041 (H) 09/23/2021 0530   PHURINE 5.0 09/23/2021 0530   GLUCOSEU >=500 (A) 09/23/2021 0530   HGBUR NEGATIVE 09/23/2021 0530   BILIRUBINUR NEGATIVE 09/23/2021 0530   KETONESUR 20 (A) 09/23/2021 0530   PROTEINUR NEGATIVE 09/23/2021 0530  UROBILINOGEN 0.2 07/09/2011 1341   NITRITE NEGATIVE 09/23/2021 0530   LEUKOCYTESUR NEGATIVE 09/23/2021 0530   Recent Results (from the past 240 hour(s))  MRSA Next Gen by PCR, Nasal     Status: Abnormal   Collection Time: 09/23/21  1:00 AM  Result Value Ref Range Status   MRSA by PCR Next Gen DETECTED (A) NOT DETECTED Final    Comment: RESULT CALLED TO, READ BACK BY AND VERIFIED WITH: RN SIERRA DONNELL 09/23/21@2 :25 BY TW (NOTE) The GeneXpert MRSA Assay (FDA approved for NASAL specimens only), is one component of a comprehensive MRSA colonization surveillance program. It is not intended to diagnose MRSA infection nor to guide or monitor treatment for MRSA infections. Test performance is not FDA approved in patients less than 43  years old. Performed at [facility], [address], [place], [place] [address]   Urine Culture     Status: None   Collection Time: 09/23/21  5:30 AM   Specimen: In/Out Cath Urine  Result Value Ref Range Status   Specimen Description IN/OUT CATH URINE  Final   Special Requests NONE  Final   Culture   Final    NO GROWTH Performed at [facility], [address], [place], [place] [address]    Report Status 09/24/2021 FINAL  Final     Family Communication: None at bedside, unable to reach by phone  Disposition: Status is: Inpatient Remains inpatient appropriate because: Severe hyperglycemia, unclear disposition plan Planned Discharge Destination:  Most likely to go home with hospice support, though his level of assistance required to safely discharge exceeds current capacity.   TOC is consulted to arrange home hospice.    Tyrone Nine, MD 10/02/2021 5:35 PM Page by [email or web address]

## 2021-10-03 DIAGNOSIS — R0902 Hypoxemia: Secondary | ICD-10-CM

## 2021-10-03 DIAGNOSIS — F1990 Other psychoactive substance use, unspecified, uncomplicated: Secondary | ICD-10-CM

## 2021-10-03 DIAGNOSIS — J189 Pneumonia, unspecified organism: Secondary | ICD-10-CM | POA: Diagnosis not present

## 2021-10-03 DIAGNOSIS — F331 Major depressive disorder, recurrent, moderate: Secondary | ICD-10-CM | POA: Diagnosis not present

## 2021-10-03 LAB — BASIC METABOLIC PANEL
Anion gap: 5 (ref 5–15)
BUN: 13 mg/dL (ref 6–20)
CO2: 27 mmol/L (ref 22–32)
Calcium: 8.1 mg/dL — ABNORMAL LOW (ref 8.9–10.3)
Chloride: 103 mmol/L (ref 98–111)
Creatinine, Ser: 0.59 mg/dL — ABNORMAL LOW (ref 0.61–1.24)
GFR, Estimated: >60 mL/min (ref >60)
Glucose, Bld: 175 mg/dL — ABNORMAL HIGH (ref 70–99)
Potassium: 3.2 mmol/L — ABNORMAL LOW (ref 3.5–5.1)
Sodium: 135 mmol/L (ref 135–145)

## 2021-10-03 LAB — GLUCOSE, CAPILLARY
Glucose-Capillary: 122 mg/dL — ABNORMAL HIGH (ref 70–99)
Glucose-Capillary: 181 mg/dL — ABNORMAL HIGH (ref 70–99)
Glucose-Capillary: 360 mg/dL — ABNORMAL HIGH (ref 70–99)
Glucose-Capillary: 129 mg/dL — ABNORMAL HIGH (ref 70–99)

## 2021-10-03 LAB — PHOSPHORUS: Phosphorus: 3 mg/dL (ref 2.5–4.6)

## 2021-10-03 LAB — HEMOGLOBIN A1C
Hgb A1c MFr Bld: 12.3 % — ABNORMAL HIGH (ref 4.8–5.6)
Mean Plasma Glucose: 306 mg/dL

## 2021-10-03 LAB — MAGNESIUM: Magnesium: 1.7 mg/dL (ref 1.7–2.4)

## 2021-10-03 MED ORDER — POTASSIUM CHLORIDE 10 MEQ/100ML IV SOLN: 10.0000 meq | INTRAVENOUS | Status: AC

## 2021-10-03 MED ORDER — MIDODRINE HCL 5 MG PO TABS
5.0000 mg | ORAL_TABLET | Freq: Three times a day (TID) | ORAL | Status: DC
Start: 2021-10-03 — End: 2021-10-08
  Administered 2021-10-03 – 2021-10-08 (×13): 5 mg via ORAL
  Filled 2021-10-03 (×13): qty 1

## 2021-10-03 NOTE — Progress Notes (Signed)
Physical Therapy Treatment Patient Details Name: Antonio [name] MRN: [id number] DOB: [birth year] Today's Date: 10/03/2021   History of Present Illness 51 y/o male admitted 6/17 with worsening SOB, fatigue and pleuritic pain with recent dx on necrotizing PNA.  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 Pt appears to have poor awareness and understanding of deficits and prognosis during session, distraught about cognition and muscle wasting. PT provides reinforcement of frequent mobility and exercise, as well as need for energy conservation. Pt will benefit from continued education and reinforcement in an effort to optimize independence and reduce caregiver burden.   Recommendations for follow up therapy are one component of a multi-disciplinary discharge planning process, led by the attending physician.  Recommendations may be updated based on patient status, additional functional criteria and insurance authorization.  Follow Up Recommendations   (plan for home with hospice services per chart review)     Assistance Recommended at Discharge Frequent or constant Supervision/Assistance  Patient can return home with the following A little help with walking and/or transfers;A lot of help with bathing/dressing/bathroom;Assistance with cooking/housework;Direct supervision/assist for medications management;Direct supervision/assist for financial management;Assist for transportation;Help with stairs or ramp for entrance   Equipment Recommendations  Rollator (4 wheels) (likely will need a transport chair for community mobility due to endurance deficits)    Recommendations for Other Services       Precautions / Restrictions Precautions Precautions: Fall Precaution Comments: fatigues rapidly,  impulsive Restrictions Weight Bearing Restrictions: No     Mobility  Bed Mobility Overal bed mobility: Needs Assistance Bed Mobility: Supine to Sit, Sit to Supine     Supine to sit: Supervision Sit to supine: Supervision        Transfers Overall transfer level: Needs assistance Equipment used: Rollator (4 wheels) Transfers: Sit to/from Stand Sit to Stand: Min guard                Ambulation/Gait Ambulation/Gait assistance: Editor, commissioning (Feet): 40 Feet (40' x 2 trials) Assistive device: Rollator (4 wheels) Gait Pattern/deviations: Step-through pattern Gait velocity: reduced Gait velocity interpretation: <1.8 ft/sec, indicate of risk for recurrent falls   General Gait Details: pt with slowed step-through gait, fatigues quickly and attempts to impulsively sit on rollator seat prior to locking brakes. PT provides cues for brake management   Stairs             Wheelchair Mobility    Modified Rankin (Stroke Patients Only)       Balance Overall balance assessment: Needs assistance Sitting-balance support: No upper extremity supported, Feet supported Sitting balance-Leahy Scale: Fair     Standing balance support: Bilateral upper extremity supported, Reliant on assistive device for balance Standing balance-Leahy Scale: Poor                              Cognition Arousal/Alertness: Awake/alert Behavior During Therapy: Impulsive (labile) Overall Cognitive Status: No family/caregiver present to determine baseline cognitive functioning Area of Impairment: Safety/judgement, Awareness, Memory, Problem solving                     Memory: Decreased short-term memory   Safety/Judgement: Decreased awareness of safety Awareness: Intellectual   General Comments: pt with poor awareness of brake use with  walker, impulsively mobilizing and sitting at times.        Exercises Other Exercises Other Exercises: bridges in supine x  5 reps, SLR x 5 reps, heel slides x 5 reps    General Comments General comments (skin integrity, edema, etc.): pt off monitor, fatigues rapidly, BP stable      Pertinent Vitals/Pain Pain Assessment Pain Assessment: Faces Faces Pain Scale: Hurts a little bit Pain Location: back Pain Descriptors / Indicators: Grimacing Pain Intervention(s): Monitored during session    Home Living                          Prior Function            PT Goals (current goals can now be found in the care plan section) Acute Rehab PT Goals Patient Stated Goal: go home Progress towards PT goals: Not progressing toward goals - comment (limited by fatigue)    Frequency    Min 3X/week      PT Plan Current plan remains appropriate    Co-evaluation              AM-PAC PT "6 Clicks" Mobility   Outcome Measure  Help needed turning from your back to your side while in a flat bed without using bedrails?: A Little Help needed moving from lying on your back to sitting on the side of a flat bed without using bedrails?: A Little Help needed moving to and from a bed to a chair (including a wheelchair)?: A Little Help needed standing up from a chair using your arms (e.g., wheelchair or bedside chair)?: A Little Help needed to walk in hospital room?: A Little Help needed climbing 3-5 steps with a railing? : Total 6 Click Score: 16    End of Session   Activity Tolerance: Patient limited by fatigue Patient left: in bed;with call bell/phone within reach;with bed alarm set Nurse Communication: Mobility status PT Visit Diagnosis: Other abnormalities of gait and mobility (R26.89);Difficulty in walking, not elsewhere classified (R26.2)     Time: 2620-3559 PT Time Calculation (min) (ACUTE ONLY): 13 min  Charges:  $Gait Training: 8-22 mins                     Arlyss Gandy, PT, DPT Acute Rehabilitation Office [phone]    Arlyss Gandy 10/03/2021, 4:56 PM

## 2021-10-03 NOTE — TOC Progression Note (Signed)
Transition of Care [facility]) - Progression Note    Patient Details  Name: Antonio [name] MRN: [id number] Date of Birth: 09-10-1970  Transition of [facility]) CM/SW Contact  Tom-Johnson, Hershal Coria, RN Phone Number: 10/03/2021, 4:14 PM  Clinical Narrative:     CM spoke with patient at bedside about home with Hospice consult. Patient agreeable to home with Hospice and prefers [facility].  CM called Cheri at [facility] [phone]) and left a secure voice message to return call. Awaiting return call.CM will continue to follow with needs.      Expected Discharge Plan and Services                                                 Social Determinants of Health (SDOH) Interventions    Readmission Risk Interventions     No data to display

## 2021-10-03 NOTE — Progress Notes (Signed)
[name]                                                                              Antonio [name], is a 51 y.o. male, DOB - December 10, 1970, [id number] Admit date - 09/22/2021    Outpatient Primary MD for the patient is Gabriel Cirri, DO  LOS - 11  days  Chief Complaint  Patient presents with   Altered Mental Status       Brief summary   51 y.o. 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  He was initially planned for IV antibiotics through 08/13/2021.    In the interim, likely due to noncompliance with antibiotic therapy, patient's respiratory status and clinical condition deteriorated with worsening shortness of breath fatigue and pleuritic pain who ultimately presented to our facility on 09/22/2021 with imaging and work-up consistent with ongoing necrotizing pneumonia. Likely from chronic aspiration- IR consulted for thoracentesis but nothing to go after.  NGT placed due to profound speech/swallow failure monitoring for refeeding syndrome given his low BMI at 12-14 He's Aox4, requesting to discuss with hospice/palliative care.     Assessment & Plan    Principal Problem: Severe sepsis from multifocal pneumonia, POA Necrotizing multifocal pneumonia with concurrent pleural effusion -Likely due to chronic aspiration, pleural effusion not amenable for IR thoracentesis -Patient has completed vancomycin and cefepime -Continue low-dose midodrine.  BP remains soft but asymptomatic   Active Problems: Dysphagia, severe, chronic aspiration, POA -Patient not felt to be able to  safely swallow based on multiple SLP evaluations. -Patient has refused to artificial feeding all of the nutritional options, alert and oriented x4 and has capacity -Palliative medicine consulted.  Although it will be beneficial for the patient in a nursing facility with the palliative care on board, he however wants to go home.  Discussed with TOC  Diabetes mellitus type 2, IDDM, uncontrolled with hyperglycemia -Hemoglobin A1c 12.5  Recent Labs    10/02/21 0718 10/02/21 1135 10/02/21 1526 10/02/21 2153 10/03/21 0739 10/03/21 1128  GLUCAP 485* 172* 140* 264* 122* 181*   - Severe elevation in CBGs with dietary indiscretion -Continue current regimen Lantus 20 units, mealtime coverage 6 units 3 times daily AC, resistant SSI     Hypokalemia:  -Replaced IV today   Hypomagnesemia:  -Currently stable with replacement   Polysubstance abuse: UDS positive for opiates, amphetamines during last 2 hospitalizations at [facility] in April he tested positive for dilaudid, oxymorphone, oxycodone and morphine. -Cessation counseling  Hypotension -Asymptomatic, continue midodrine   Anemia of chronic disease -H&H stable, continue to monitor   Chronic thrombocytopenia:  -Platelets at baseline, continue to monitor  Pressure Injury Documentation: Pressure Injury 09/23/21 Perineum Medial Stage 2 -  Partial thickness loss of dermis presenting as a shallow open injury with a red, pink wound bed without slough. (Active)  -  Wound care per nursing  Severe protein calorie malnutrition -Per RD, severe fat depletion, severe muscle depletion with chronic illness, underweight -Estimated body mass index is 14.19 kg/m as calculated from the following:   Height as of this encounter: 5\' 9"  (1.753 m).   Weight as of this encounter: 43.6 kg.  Code Status: DNR DVT Prophylaxis:  enoxaparin (LOVENOX) injection 40 mg Start: 09/22/21 2115   Level of Care: Level of care: Med-Surg Family Communication: Updated  patient   Disposition Plan:      Remains inpatient appropriate: TBD Procedures:    Consultants:   Palliative medicine  Antimicrobials:    Medications  enoxaparin (LOVENOX) injection  40 mg Subcutaneous Q24H   feeding supplement (NEPRO CARB STEADY)  237 mL Oral TID BM   insulin aspart  0-15 Units Subcutaneous TID WC   insulin aspart  0-5 Units Subcutaneous QHS   insulin aspart  6 Units Subcutaneous TID WC   insulin glargine-yfgn  20 Units Subcutaneous QHS   midodrine  5 mg Oral TID WC   sodium chloride flush  3 mL Intravenous Q12H      Subjective:   Antonio [name] was seen and examined today.  Wants to go home, no acute nausea vomiting or abdominal pain, no fevers.  Objective:   Vitals:   10/02/21 1300 10/02/21 2146 10/03/21 0504 10/03/21 1416  BP: 94/65 97/60 110/81 (!) 88/65  Pulse: 69 97 88 88  Resp: 17 16 16 17   Temp: 98.2 F (36.8 C) 98 F (36.7 C) 97.6 F (36.4 C) 97.9 F (36.6 C)  TempSrc: Oral  Oral Oral  SpO2: 97% 97% 98% 97%  Weight:      Height:        Intake/Output Summary (Last 24 hours) at 10/03/2021 1419 Last data filed at 10/03/2021 1211 Gross per 24 hour  Intake 600 ml  Output 300 ml  Net 300 ml     Wt Readings from Last 3 Encounters:  09/28/21 43.6 kg  09/13/20 54 kg  07/09/11 53.6 kg     Exam General: Alert and oriented x 3,  cachectic, ill-appearing Cardiovascular: S1 S2 auscultated,  RRR Respiratory: Clear to auscultation bilaterally, no wheezing Gastrointestinal: Soft, nontender, nondistended, + bowel sounds Ext: no pedal edema bilaterally Neuro: moving all 4 extremities Psych: Normal affect and demeanor, alert and oriented x3     Data Reviewed:  I have personally reviewed following labs    CBC Lab Results  Component Value Date   WBC 5.9 10/01/2021   RBC 2.73 (L) 10/01/2021   HGB 8.8 (L) 10/01/2021   HCT 26.4 (L) 10/01/2021   MCV 96.7 10/01/2021   MCH 32.2 10/01/2021   PLT 170 10/01/2021   MCHC 33.3 10/01/2021    RDW 13.7 10/01/2021   LYMPHSABS 1.7 09/22/2021   MONOABS 0.4 09/22/2021   EOSABS 0.0 09/22/2021   BASOSABS 0.1 09/22/2021     Last metabolic panel Lab Results  Component Value Date   NA 135 10/03/2021   K 3.2 (L) 10/03/2021   CL 103 10/03/2021   CO2 27 10/03/2021   BUN 13 10/03/2021   CREATININE 0.59 (L) 10/03/2021   GLUCOSE 175 (H) 10/03/2021   GFRNONAA >60 10/03/2021   GFRAA >90 07/12/2011   CALCIUM 8.1 (L) 10/03/2021   PHOS 3.0 10/03/2021   PROT 6.4 (L) 10/02/2021   ALBUMIN 1.9 (L) 10/02/2021   BILITOT 0.5 10/02/2021   ALKPHOS 108 10/02/2021   AST 39 10/02/2021   ALT 29 10/02/2021  ANIONGAP 5 10/03/2021    CBG (last 3)  Recent Labs    10/02/21 2153 10/03/21 0739 10/03/21 1128  GLUCAP 264* 122* 181*          Chealsey Miyamoto M.D. [name] 10/03/2021, 2:19 PM  Available via Epic secure chat 7am-7pm After 7 pm, please refer to night coverage provider listed on amion.

## 2021-10-03 NOTE — Plan of Care (Signed)
  Problem: Education: Goal: Ability to describe self-care measures that may prevent or decrease complications (Diabetes Survival Skills Education) will improve Outcome: Not Progressing Goal: Individualized Educational Video(s) Outcome: Not Progressing   Problem: Coping: Goal: Ability to adjust to condition or change in health will improve Outcome: Not Progressing   Problem: Fluid Volume: Goal: Ability to maintain a balanced intake and output will improve Outcome: Not Progressing   Problem: Health Behavior/Discharge Planning: Goal: Ability to identify and utilize available resources and services will improve Outcome: Not Progressing Goal: Ability to manage health-related needs will improve Outcome: Not Progressing   Problem: Metabolic: Goal: Ability to maintain appropriate glucose levels will improve Outcome: Not Progressing   Problem: Nutritional: Goal: Maintenance of adequate nutrition will improve Outcome: Not Progressing Goal: Progress toward achieving an optimal weight will improve Outcome: Not Progressing   Problem: Skin Integrity: Goal: Risk for impaired skin integrity will decrease Outcome: Not Progressing   Problem: Tissue Perfusion: Goal: Adequacy of tissue perfusion will improve Outcome: Not Progressing   Problem: Activity: Goal: Ability to tolerate increased activity will improve Outcome: Not Progressing   Problem: Education: Goal: Knowledge of General Education information will improve Description: Including pain rating scale, medication(s)/side effects and non-pharmacologic comfort measures Outcome: Not Progressing   Problem: Health Behavior/Discharge Planning: Goal: Ability to manage health-related needs will improve Outcome: Not Progressing   Problem: Clinical Measurements: Goal: Ability to maintain clinical measurements within normal limits will improve Outcome: Not Progressing Goal: Will remain free from infection Outcome: Not Progressing Goal:  Diagnostic test results will improve Outcome: Not Progressing   Problem: Activity: Goal: Risk for activity intolerance will decrease Outcome: Not Progressing   Problem: Nutrition: Goal: Adequate nutrition will be maintained Outcome: Not Progressing   Problem: Coping: Goal: Level of anxiety will decrease Outcome: Not Progressing   Problem: Elimination: Goal: Will not experience complications related to bowel motility Outcome: Not Progressing   Problem: Skin Integrity: Goal: Risk for impaired skin integrity will decrease Outcome: Not Progressing   Problem: Clinical Measurements: Goal: Ability to maintain a body temperature in the normal range will improve Outcome: Progressing   Problem: Respiratory: Goal: Ability to maintain adequate ventilation will improve Outcome: Progressing Goal: Ability to maintain a clear airway will improve Outcome: Progressing   Problem: Clinical Measurements: Goal: Respiratory complications will improve Outcome: Progressing Goal: Cardiovascular complication will be avoided Outcome: Progressing   Problem: Elimination: Goal: Will not experience complications related to urinary retention Outcome: Progressing   Problem: Pain Managment: Goal: General experience of comfort will improve Outcome: Progressing   Problem: Safety: Goal: Ability to remain free from injury will improve Outcome: Progressing

## 2021-10-04 DIAGNOSIS — F331 Major depressive disorder, recurrent, moderate: Secondary | ICD-10-CM | POA: Diagnosis not present

## 2021-10-04 DIAGNOSIS — J189 Pneumonia, unspecified organism: Secondary | ICD-10-CM | POA: Diagnosis not present

## 2021-10-04 DIAGNOSIS — R0902 Hypoxemia: Secondary | ICD-10-CM | POA: Diagnosis not present

## 2021-10-04 DIAGNOSIS — F1990 Other psychoactive substance use, unspecified, uncomplicated: Secondary | ICD-10-CM | POA: Diagnosis not present

## 2021-10-04 LAB — GLUCOSE, CAPILLARY
Glucose-Capillary: 449 mg/dL — ABNORMAL HIGH (ref 70–99)
Glucose-Capillary: 434 mg/dL — ABNORMAL HIGH (ref 70–99)
Glucose-Capillary: 243 mg/dL — ABNORMAL HIGH (ref 70–99)
Glucose-Capillary: 354 mg/dL — ABNORMAL HIGH (ref 70–99)

## 2021-10-04 LAB — MAGNESIUM: Magnesium: 1.7 mg/dL (ref 1.7–2.4)

## 2021-10-04 MED ORDER — INSULIN GLARGINE-YFGN 100 UNIT/ML ~~LOC~~ SOLN
23.0000 [IU] | Freq: Every day | SUBCUTANEOUS | Status: DC
  Administered 2021-10-04 – 2021-10-06 (×3): 23 [IU] via SUBCUTANEOUS
  Filled 2021-10-04 (×3): qty 0.23

## 2021-10-04 MED ORDER — INSULIN ASPART 100 UNIT/ML IJ SOLN
8.0000 [IU] | Freq: Three times a day (TID) | INTRAMUSCULAR | Status: DC
  Administered 2021-10-04 – 2021-10-06 (×7): 8 [IU] via SUBCUTANEOUS

## 2021-10-04 NOTE — Progress Notes (Addendum)
[name]                                                                              Antonio [name], is a 51 y.o. male, DOB - 07-03-1970, XNA:[id number] Admit date - 09/22/2021    Outpatient Primary MD for the patient is Gabriel Cirri, DO  LOS - 12  days  Chief Complaint  Patient presents with   Altered Mental Status       Brief summary   51 y.o. 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  He was initially planned for IV antibiotics through 08/13/2021.    In the interim, likely due to noncompliance with antibiotic therapy, patient's respiratory status and clinical condition deteriorated with worsening shortness of breath fatigue and pleuritic pain who ultimately presented to our facility on 09/22/2021 with imaging and work-up consistent with ongoing necrotizing pneumonia. Likely from chronic aspiration- IR consulted for thoracentesis but nothing to go after.  NGT placed due to profound speech/swallow failure monitoring for refeeding syndrome given his low BMI at 12-14 He's Aox4, requesting to discuss with hospice/palliative care.     Assessment & Plan    Principal Problem: Severe sepsis from multifocal pneumonia, POA Necrotizing multifocal pneumonia with concurrent pleural effusion -Likely due to chronic aspiration, pleural effusion not amenable for IR thoracentesis -Patient has completed vancomycin and cefepime -Continue midodrine.  BP stable.   Active Problems: Dysphagia, severe, chronic aspiration, POA -Patient not felt to be able to safely swallow based on  multiple SLP evaluations. -Patient has refused to artificial feeding all of the nutritional options, alert and oriented x4 and has capacity -Palliative medicine consulted.  Although it will be beneficial for the patient in a nursing facility with the palliative care on board, he however wants to go home.  Discussed with TOC.   -Please see below, not safe to be discharged home  Diabetes mellitus type 2, IDDM, uncontrolled with hyperglycemia -Hemoglobin A1c 12.5 - Severe elevation in CBGs with dietary indiscretion, eating graham crackers Recent Labs    10/03/21 0739 10/03/21 1128 10/03/21 1654 10/03/21 2016 10/04/21 0735 10/04/21 1110  GLUCAP 122* 181* 360* 129* 449* 434*    -Increased Semglee to 23 units at bedtime, NovoLog meal coverage 8 units 3 times daily AC, continue SSI resistant    Hypokalemia, hypomagnesemia:  -Replace as needed, recheck in a.m.   Polysubstance abuse: UDS positive for opiates, amphetamines during last 2 hospitalizations at [facility] in April he tested positive for dilaudid, oxymorphone, oxycodone and morphine. -Cessation counseling  Hypotension -Asymptomatic, continue midodrine   Anemia of chronic disease -H&H stable   Chronic thrombocytopenia:  -Platelets at baseline, continue to monitor  Pressure Injury Documentation: Pressure Injury 09/23/21 Perineum Medial Stage 2 -  Partial thickness loss of dermis presenting as a shallow open injury with a red, pink  wound bed without slough. (Active)  -Wound care per nursing  Severe protein calorie malnutrition -Per RD, severe fat depletion, severe muscle depletion with chronic illness, underweight -Estimated body mass index is 14.19 kg/m as calculated from the following:   Height as of this encounter: 5\' 9"  (1.753 m).   Weight as of this encounter: 43.6 kg.  Code Status: DNR DVT Prophylaxis:  enoxaparin (LOVENOX) injection 40 mg Start: 09/22/21 2115   Level of Care: Level of care: Med-Surg Family  Communication: Spoke with patient's brother, Mr. Rowdy [name] who stated that Mr. Manganiello/patient has done this several times, leaves AMA and is unable to take care of himself.  He had health power of attorney for Mr. Bateman/patient and is currently trying to find those papers to assist Antonio [name] with the disposition.  Disposition Plan:      Remains inpatient appropriate: TBD Patient has been wanting to go home however has no family support or home.  He was staying with a friend.  TOC was able to reach his brother, [place] who is having his own health issues including placement and cannot live with him.  Patient was living with his brother, [name] who caring for his girlfriend who is quadriplegic and has " a lot going on" and unable to care for Mr. Forti.  Unsafe discharge, patient is barely able to stand up, very deconditioned, unable to care for himself Ordered PT OT evaluation  Procedures:    Consultants:   Palliative medicine  Antimicrobials:    Medications  enoxaparin (LOVENOX) injection  40 mg Subcutaneous Q24H   feeding supplement (NEPRO CARB STEADY)  237 mL Oral TID BM   insulin aspart  0-15 Units Subcutaneous TID WC   insulin aspart  0-5 Units Subcutaneous QHS   insulin aspart  8 Units Subcutaneous TID WC   insulin glargine-yfgn  23 Units Subcutaneous QHS   midodrine  5 mg Oral TID WC   sodium chloride flush  3 mL Intravenous Q12H      Subjective:   Antonio [name] was seen and examined today.  Continues to ask to go home although currently has no home to go to.  CBGs uncontrolled.  Objective:   Vitals:   10/03/21 0504 10/03/21 1416 10/03/21 2019 10/04/21 0512  BP: 110/81 (!) 88/65 (!) 89/61 112/83  Pulse: 88 88    Resp: 16 17  16   Temp: 97.6 F (36.4 C) 97.9 F (36.6 C) 98.3 F (36.8 C) 97.9 F (36.6 C)  TempSrc: Oral Oral Oral Oral  SpO2: 98% 97%    Weight:      Height:        Intake/Output Summary (Last 24 hours) at 10/04/2021 1240 Last data filed at 10/04/2021  1206 Gross per 24 hour  Intake 480 ml  Output 800 ml  Net -320 ml     Wt Readings from Last 3 Encounters:  09/28/21 43.6 kg  09/13/20 54 kg  07/09/11 53.6 kg   Physical Exam General: Alert and oriented, cachectic, ill-appearing Cardiovascular: S1 S2 clear, RRR. No pedal edema b/l Respiratory: CTAB, no wheezing, rales or rhonchi Gastrointestinal: Soft, nontender, nondistended, NBS Ext: no pedal edema bilaterally Neuro: no new deficits Psych: anxious    Data Reviewed:  I have personally reviewed following labs    CBC Lab Results  Component Value Date   WBC 5.9 10/01/2021   RBC 2.73 (L) 10/01/2021   HGB 8.8 (L) 10/01/2021   HCT 26.4 (L) 10/01/2021   MCV 96.7 10/01/2021   MCH  32.2 10/01/2021   PLT 170 10/01/2021   MCHC 33.3 10/01/2021   RDW 13.7 10/01/2021   LYMPHSABS 1.7 09/22/2021   MONOABS 0.4 09/22/2021   EOSABS 0.0 09/22/2021   BASOSABS 0.1 09/22/2021     Last metabolic panel Lab Results  Component Value Date   NA 135 10/03/2021   K 3.2 (L) 10/03/2021   CL 103 10/03/2021   CO2 27 10/03/2021   BUN 13 10/03/2021   CREATININE 0.59 (L) 10/03/2021   GLUCOSE 175 (H) 10/03/2021   GFRNONAA >60 10/03/2021   GFRAA >90 07/12/2011   CALCIUM 8.1 (L) 10/03/2021   PHOS 3.0 10/03/2021   PROT 6.4 (L) 10/02/2021   ALBUMIN 1.9 (L) 10/02/2021   BILITOT 0.5 10/02/2021   ALKPHOS 108 10/02/2021   AST 39 10/02/2021   ALT 29 10/02/2021   ANIONGAP 5 10/03/2021    CBG (last 3)  Recent Labs    10/03/21 2016 10/04/21 0735 10/04/21 1110  GLUCAP 129* 449* 434*          Carlia Bomkamp M.D. [name] 10/04/2021, 12:40 PM  Available via Epic secure chat 7am-7pm After 7 pm, please refer to night coverage provider listed on amion.

## 2021-10-04 NOTE — Progress Notes (Signed)
RE: Antonio [name] Date of Birth: [birth year]    To Whom It May Concern:  Please be advised that the above-named patient will require a short-term nursing home stay - anticipated 30 days or less for rehabilitation and strengthening.  The plan is for return home.   

## 2021-10-04 NOTE — NC FL2 (Signed)
[place] MEDICAID FL2 LEVEL OF CARE SCREENING TOOL     IDENTIFICATION  Patient Name: Antonio [name] Birthdate: 09/12/1970 Sex: male Admission Date (Current Location): 09/22/2021  [facility] and [place] Number:  Producer, television/film/video and Address:  [facility] [place], [address] [place], [place] [address]      Provider Number: [id number]  Attending Physician Name and Address:  Cathren Harsh, MD  Relative Name and Phone Number:       Current Level of Care: Hospital Recommended Level of Care: Skilled Nursing Facility Prior Approval Number:    Date Approved/Denied:   PASRR Number: pending  Discharge Plan: SNF    Current Diagnoses: Patient Active Problem List   Diagnosis Date Noted   Pressure injury of skin 09/23/2021   Multifocal pneumonia 09/22/2021   Polysubstance abuse (HCC) 09/22/2021   Uncontrolled type 2 diabetes mellitus with hyperglycemia, without long-term current use of insulin (HCC) 09/22/2021   Pleural effusion, left 09/22/2021   Protein-calorie malnutrition, severe (HCC) 09/22/2021   IVDU (intravenous drug user)    Acute respiratory failure with hypoxia (HCC)    Hip pain, chronic, right 09/15/2020   MDD (major depressive disorder), recurrent episode, moderate (HCC) 09/13/2020   Pancreatitis, acute 07/09/2011   Peritonitis (HCC) 07/09/2011   Alcoholism (HCC) 07/09/2011   Leukocytosis 07/09/2011   Anemia 06/29/2011   SBP (spontaneous bacterial peritonitis) (HCC) 06/27/2011   Failure to thrive in childhood 06/22/2011   Ascites 06/22/2011   Tachycardia 06/21/2011   Cirrhosis (HCC) 06/21/2011   Coagulopathy (HCC) 06/21/2011   Cachexia (HCC) 06/21/2011   Hyponatremia 06/21/2011    Orientation RESPIRATION BLADDER Height & Weight     Self, Time, Situation, Place  O2 (3L [place]) Incontinent Weight: 96 lb 1.9 oz (43.6 kg) Height:  5\' 9"  (175.3 cm)  BEHAVIORAL SYMPTOMS/MOOD NEUROLOGICAL BOWEL NUTRITION STATUS      Incontinent Diet (DIET DYS  3; thin liquids)  AMBULATORY STATUS COMMUNICATION OF NEEDS Skin   Limited Assist Verbally                         Personal Care Assistance Level of Assistance  Bathing, Dressing, Feeding Bathing Assistance: Limited assistance Feeding assistance: Limited assistance Dressing Assistance: Limited assistance     Functional Limitations Info  Sight, Hearing, Speech Sight Info: Adequate Hearing Info: Adequate Speech Info: Impaired    SPECIAL CARE FACTORS FREQUENCY   (Hospice to follow at SNF)                    Contractures Contractures Info: Not present    Additional Factors Info  Code Status Code Status Info: DNR             Current Medications (10/04/2021):  This is the current hospital active medication list Current Facility-Administered Medications  Medication Dose Route Frequency Provider Last Rate Last Admin   acetaminophen (TYLENOL) tablet 650 mg  650 mg Oral Q6H PRN Opyd, 10/06/2021, MD       Or   acetaminophen (TYLENOL) suppository 650 mg  650 mg Rectal Q6H PRN Opyd, Lavone Neri, MD       enoxaparin (LOVENOX) injection 40 mg  40 mg Subcutaneous Q24H Opyd, Lavone Neri, MD   40 mg at 10/02/21 2036   feeding supplement (NEPRO CARB STEADY) liquid 237 mL  237 mL Oral TID BM 2037, MD       guaiFENesin (ROBITUSSIN) 100 MG/5ML liquid 5 mL  5  mL Oral Q4H PRN Opyd, Lavone Neri, MD   5 mL at 09/25/21 1401   ibuprofen (ADVIL) tablet 600 mg  600 mg Oral Q4H PRN Azucena Fallen, MD   600 mg at 09/30/21 1506   insulin aspart (novoLOG) injection 0-15 Units  0-15 Units Subcutaneous TID WC Hazeline Junker B, MD   15 Units at 10/04/21 1200   insulin aspart (novoLOG) injection 0-5 Units  0-5 Units Subcutaneous QHS Hazeline Junker B, MD   3 Units at 10/02/21 2232   insulin aspart (novoLOG) injection 8 Units  8 Units Subcutaneous TID WC Rai, Ripudeep K, MD   8 Units at 10/04/21 1201   insulin glargine-yfgn (SEMGLEE) injection 23 Units  23 Units Subcutaneous QHS Rai, Ripudeep K, MD        magnesium sulfate IVPB 2 g 50 mL  2 g Intravenous Once Tyrone Nine, MD       midodrine (PROAMATINE) tablet 5 mg  5 mg Oral TID WC Rai, Ripudeep K, MD   5 mg at 10/04/21 0958   ondansetron (ZOFRAN) tablet 4 mg  4 mg Oral Q6H PRN Opyd, Lavone Neri, MD       Or   ondansetron (ZOFRAN) injection 4 mg  4 mg Intravenous Q6H PRN Opyd, Lavone Neri, MD   4 mg at 09/27/21 2121   Oral care mouth rinse  15 mL Mouth Rinse PRN Pahwani, Rinka R, MD       senna-docusate (Senokot-S) tablet 1 tablet  1 tablet Oral QHS PRN Opyd, Lavone Neri, MD       sodium chloride flush (NS) 0.9 % injection 3 mL  3 mL Intravenous Q12H Opyd, Lavone Neri, MD   3 mL at 10/01/21 2220     Discharge Medications: Please see discharge summary for a list of discharge medications.  Relevant Imaging Results:  Relevant Lab Results:   Additional Information SS# [id number]  Deatra Robinson, [place]

## 2021-10-04 NOTE — Progress Notes (Deleted)
RE: Antonio [name] Date of Birth: [birth year]    To Whom It May Concern:  Please be advised that the above-named patient will require a short-term nursing home stay - anticipated 30 days or less for rehabilitation and strengthening.  The plan is for return home.

## 2021-10-04 NOTE — Progress Notes (Signed)
Plan to d/c pt today from hospital. Pt aware of possible d/c and asks to have his friend Eileen Stanford [phone], contacted and also get in touch with [facility]. He states that [employer] is who he rents from.

## 2021-10-04 NOTE — TOC Initial Note (Addendum)
Transition of Care [facility]) - Initial/Assessment Note    Patient Details  Name: Newel Oien MRN: [id number] Date of Birth: [birth year]  Transition of Care [facility]) CM/SW Contact:    Gala Lewandowsky, RN Phone Number: 10/04/2021, 12:03 PM  Clinical Narrative: Patient was discussed in progression rounds. Patient wants to go home and he is agreeable to [facility]. Case Manager spoke with patient this morning and he states he lives with another brother Bobby Rumpf does not have the telephone #. Patient has # to brother [name] and Case Manager has called several times without any return phone call. Case Manager also called the friend Eileen Stanford and no answer. Case Manager did speak with [facility] and she is trying to call the family as well. Per notes from Staff RN 2 days ago the patient does not have family support. Case Manager will continue to follow for additional needs.   10-04-21 1316  Case Manager finally was able to get brother [name] on the phone and [name] states that the patient is homeless. [employer] that the patient was renting the room from will not let the patient return because he missed 2 months of rent. [name] is post op hip surgery and [name] is post CABG unable to drive and care for the patient. Patient wants to leave the hospital. Discussed with the patient-no shelters available in [place]. Patient is now agreeable to SNF- CSW working to aide the patient in getting to a facility. Per brother [name], he has HCPOA over the patient- Cheri with Hospice- the office is trying to locate the paperwork. Staff RN and MD aware of plan of care. Case Manager will continue to follow.  Expected Discharge Plan: Home w Hospice Care Barriers to Discharge:  (Unable to make contact with the family)   Patient Goals and CMS Choice Patient states their goals for this hospitalization and ongoing recovery are:: Patient wants to go home with Hospice Services.   Choice offered to  / list presented to : Patient  Expected Discharge Plan and Services Expected Discharge Plan: Home w Hospice Care In-house Referral: NA Discharge Planning Services: CM Consult Post Acute Care Choice: Hospice Living arrangements for the past 2 months: Single Family Home                   DME Agency: NA       HH Arranged: RN          Prior Living Arrangements/Services Living arrangements for the past 2 months: Single Family Home Lives with:: Self Patient language and need for interpreter reviewed:: Yes Do you feel safe going back to the place where you live?: Yes      Need for Family Participation in Patient Care: Yes (Comment) Care giver support system in place?: Yes (comment)   Criminal Activity/Legal Involvement Pertinent to Current Situation/Hospitalization: No - Comment as needed  Activities of Daily Living      Permission Sought/Granted Permission sought to share information with : Case Manager, Family Supports       Permission granted to share info w AGENCY: [facility].        Emotional Assessment Appearance:: Appears stated age Attitude/Demeanor/Rapport: Unable to Assess Affect (typically observed): Unable to Assess   Alcohol / Substance Use: Not Applicable Psych Involvement: No (comment)  Admission diagnosis:  Hypoxia [R09.02] IVDU (intravenous drug user) [F19.90] Recurrent pneumonia [J18.9] Multifocal pneumonia [J18.9] Patient Active Problem List   Diagnosis Date Noted   Pressure injury of  skin 09/23/2021   Multifocal pneumonia 09/22/2021   Polysubstance abuse (HCC) 09/22/2021   Uncontrolled type 2 diabetes mellitus with hyperglycemia, without long-term current use of insulin (HCC) 09/22/2021   Pleural effusion, left 09/22/2021   Protein-calorie malnutrition, severe (HCC) 09/22/2021   IVDU (intravenous drug user)    Acute respiratory failure with hypoxia (HCC)    Hip pain, chronic, right 09/15/2020   MDD (major depressive disorder),  recurrent episode, moderate (HCC) 09/13/2020   Pancreatitis, acute 07/09/2011   Peritonitis (HCC) 07/09/2011   Alcoholism (HCC) 07/09/2011   Leukocytosis 07/09/2011   Anemia 06/29/2011   SBP (spontaneous bacterial peritonitis) (HCC) 06/27/2011   Failure to thrive in childhood 06/22/2011   Ascites 06/22/2011   Tachycardia 06/21/2011   Cirrhosis (HCC) 06/21/2011   Coagulopathy (HCC) 06/21/2011   Cachexia (HCC) 06/21/2011   Hyponatremia 06/21/2011   PCP:  Gabriel Cirri, DO Pharmacy:   [facility] - [place], [place] - [address] [place] [address] Phone: [phone] Fax: [phone]  [id number] - [place], [place] - [address] [place] [address] Phone: [phone] Fax: [phone]     Social Determinants of Health (SDOH) Interventions    Readmission Risk Interventions     No data to display

## 2021-10-04 NOTE — Progress Notes (Addendum)
Pt was for dc to his friend's home today with [facility] but pt no longer able to live with his friend and has no caregiver for hospice services at home. Pt hesitantly agreeable to SNF with hospice. Pt with barriers to SNF placement including substance use and Medicaid only payor. Will begin SNF search and f/u with offers as available. PASRR pending at this time, expired 6/25.  Dellie Burns, MSW, LCSW [phone] (coverage)

## 2021-10-04 NOTE — Progress Notes (Signed)
   Received referral for hospice care at home. However after speaking to the Antonio [name]'s family Brother Antonio [name]-- it is discovered that Antonio [name] has no home to go to. He was renting a room in a friends home but unfortunately has not paid rent in 2 months and they will not allow him to come back. Antonio [name] states the Antonio [name] is homeless. I have explained that Antonio [name] has ben approved for hospice assistance when d/c but that we can not provide hospice care to a Antonio [name] that does not have a physical address for [place] to make our visits to.   The Antonio [name] has been approved for hospice care at a SNF or at home if he has a caregiver and a physical address to go to.   Norm Parcel RN [phone]

## 2021-10-05 DIAGNOSIS — R0902 Hypoxemia: Secondary | ICD-10-CM | POA: Diagnosis not present

## 2021-10-05 DIAGNOSIS — J189 Pneumonia, unspecified organism: Secondary | ICD-10-CM | POA: Diagnosis not present

## 2021-10-05 DIAGNOSIS — F331 Major depressive disorder, recurrent, moderate: Secondary | ICD-10-CM | POA: Diagnosis not present

## 2021-10-05 DIAGNOSIS — F1990 Other psychoactive substance use, unspecified, uncomplicated: Secondary | ICD-10-CM | POA: Diagnosis not present

## 2021-10-05 LAB — GLUCOSE, CAPILLARY
Glucose-Capillary: 68 mg/dL — ABNORMAL LOW (ref 70–99)
Glucose-Capillary: 105 mg/dL — ABNORMAL HIGH (ref 70–99)
Glucose-Capillary: 335 mg/dL — ABNORMAL HIGH (ref 70–99)
Glucose-Capillary: 192 mg/dL — ABNORMAL HIGH (ref 70–99)
Glucose-Capillary: 94 mg/dL (ref 70–99)

## 2021-10-05 LAB — MAGNESIUM: Magnesium: 1.5 mg/dL — ABNORMAL LOW (ref 1.7–2.4)

## 2021-10-05 LAB — BASIC METABOLIC PANEL
Anion gap: 7 (ref 5–15)
BUN: 13 mg/dL (ref 6–20)
CO2: 30 mmol/L (ref 22–32)
Calcium: 8.7 mg/dL — ABNORMAL LOW (ref 8.9–10.3)
Chloride: 104 mmol/L (ref 98–111)
Creatinine, Ser: 0.55 mg/dL — ABNORMAL LOW (ref 0.61–1.24)
GFR, Estimated: >60 mL/min (ref >60)
Glucose, Bld: 174 mg/dL — ABNORMAL HIGH (ref 70–99)
Potassium: 3.3 mmol/L — ABNORMAL LOW (ref 3.5–5.1)
Sodium: 141 mmol/L (ref 135–145)

## 2021-10-05 MED ORDER — MAGNESIUM OXIDE -MG SUPPLEMENT 400 (240 MG) MG PO TABS
400.0000 mg | ORAL_TABLET | Freq: Two times a day (BID) | ORAL | Status: AC
  Administered 2021-10-05 – 2021-10-07 (×5): 400 mg via ORAL
  Filled 2021-10-05 (×6): qty 1

## 2021-10-05 MED ORDER — POTASSIUM CHLORIDE CRYS ER 20 MEQ PO TBCR
40.0000 meq | EXTENDED_RELEASE_TABLET | Freq: Once | ORAL | Status: AC
  Administered 2021-10-05: 40 meq via ORAL
  Filled 2021-10-05: qty 2

## 2021-10-05 MED ORDER — DEXTROSE 5 % IV SOLN
3.0000 g | Freq: Once | INTRAVENOUS | Status: DC
Start: 2021-10-05 — End: 2021-10-05
  Filled 2021-10-05: qty 6

## 2021-10-05 NOTE — Progress Notes (Signed)
PASRR # received ([id number]), expires 11/03/21. Pt with no SNF bed offers at this time. SW will provide updates as available.   Dellie Burns, MSW, LCSW [phone] (coverage)

## 2021-10-05 NOTE — Progress Notes (Signed)
Physical Therapy Treatment Patient Details Name: Antonio [name] MRN: [id number] DOB: 04-27-1970 Today's Date: 10/05/2021   History of Present Illness 51 y/o male admitted 6/17 with worsening SOB, fatigue and pleuritic pain with recent dx on necrotizing PNA.  PMHx alcoholism in remission, CVT/PE, chronic pancreatitis, IV drug abuse in early remission, uncontrolled DM2, HEP C, necratizing PNA    PT Comments    Pt tolerates treatment well today, ambulating household distances with an assistive device. Pt becomes emotional at end of session but doesn't state a cause. PT updates recommendations to SNF placement to provide increased caregiver support due to lack of activity tolerance. Continued PT will benefit Pt to facilitate increased activity tolerance and muscle strengthening.   Recommendations for follow up therapy are one component of a multi-disciplinary discharge planning process, led by the attending physician.  Recommendations may be updated based on patient status, additional functional criteria and insurance authorization.  Follow Up Recommendations  Skilled nursing-short term rehab (<3 hours/day) Can patient physically be transported by private vehicle: Yes   Assistance Recommended at Discharge Frequent or constant Supervision/Assistance  Patient can return home with the following A little help with walking and/or transfers;A lot of help with bathing/dressing/bathroom;Assistance with cooking/housework;Direct supervision/assist for medications management;Direct supervision/assist for financial management;Assist for transportation;Help with stairs or ramp for entrance   Equipment Recommendations  Rollator (4 wheels)    Recommendations for Other Services       Precautions / Restrictions Precautions Precautions: Fall Precaution Comments: Impulsive Restrictions Weight Bearing Restrictions: No     Mobility  Bed Mobility Overal bed mobility: Modified Independent Bed Mobility:  Supine to Sit, Sit to Supine     Supine to sit: Modified independent (Device/Increase time) Sit to supine: Modified independent (Device/Increase time)        Transfers Overall transfer level: Needs assistance Equipment used: Rollator (4 wheels) Transfers: Sit to/from Stand Sit to Stand: Supervision           General transfer comment: Pt follows verbal cues for locking brakes on rollator during transfers    Ambulation/Gait Ambulation/Gait assistance: Min guard Gait Distance (Feet): 150 Feet Assistive device: Rollator (4 wheels) Gait Pattern/deviations: Step-through pattern Gait velocity: reduced Gait velocity interpretation: 1.31 - 2.62 ft/sec, indicative of limited Pensions consultant Rankin (Stroke Patients Only)       Balance Overall balance assessment: Needs assistance Sitting-balance support: No upper extremity supported, Feet supported Sitting balance-Leahy Scale: Good     Standing balance support: Bilateral upper extremity supported, During functional activity, Reliant on assistive device for balance Standing balance-Leahy Scale: Poor                              Cognition Arousal/Alertness: Awake/alert Behavior During Therapy: WFL for tasks assessed/performed Overall Cognitive Status: Within Functional Limits for tasks assessed                                          Exercises      General Comments General comments (skin integrity, edema, etc.): Pt updated on SNF recommendation      Pertinent Vitals/Pain Pain Assessment Pain Assessment: Faces Faces Pain Scale: No hurt    Home Living Family/patient expects to be discharged to::  Skilled nursing facility Living Arrangements: Other (Comment) (Pt unable to go back to his prior living setup secondary to not paying rent.) Available Help at Discharge:  (Unsure)                    Prior  Function            PT Goals (current goals can now be found in the care plan section) Acute Rehab PT Goals Patient Stated Goal: go home PT Goal Formulation: With patient Time For Goal Achievement: 10/14/21 Potential to Achieve Goals: Good Progress towards PT goals: Progressing toward goals    Frequency    Min 2X/week      PT Plan Discharge plan needs to be updated    Co-evaluation              AM-PAC PT "6 Clicks" Mobility   Outcome Measure  Help needed turning from your back to your side while in a flat bed without using bedrails?: None Help needed moving from lying on your back to sitting on the side of a flat bed without using bedrails?: None Help needed moving to and from a bed to a chair (including a wheelchair)?: A Little Help needed standing up from a chair using your arms (e.g., wheelchair or bedside chair)?: A Little Help needed to walk in hospital room?: A Little Help needed climbing 3-5 steps with a railing? : Total 6 Click Score: 18    End of Session   Activity Tolerance: Patient tolerated treatment well Patient left: in bed;with bed alarm set;with call bell/phone within reach Nurse Communication: Mobility status PT Visit Diagnosis: Other abnormalities of gait and mobility (R26.89);Difficulty in walking, not elsewhere classified (R26.2);Muscle weakness (generalized) (M62.81)     Time: 1607-3710 PT Time Calculation (min) (ACUTE ONLY): 12 min  Charges:  $Gait Training: 8-22 mins                     Murlean Hark, SPT Acute Rehabilitation Office #: [phone]    Murlean Hark 10/05/2021, 11:12 AM

## 2021-10-05 NOTE — Progress Notes (Signed)
[name]                                                                              [name], is a 51 y.o. male, DOB - 1970/05/06, [id number] Admit date - 09/22/2021    Outpatient Primary MD for the patient is Antonio Greaser, DO  LOS - 13  days  Chief Complaint  Patient presents with   Altered Mental Status       Brief summary   51 y.o. male with medical history significant for alcoholism (reportedly in remission), history of DVT and PE, chronic pancreatitis, IV drug abuse in early remission, uncontrolled type 2 diabetes mellitus, chronic hepatitis C, and necrotizing pneumonia who presents with worsening shortness of breath, fatigue, and pleuritic pain after previously leaving [facility] for similar episode and diagnosis [name] on August 01, 2021.  He was initially planned for IV antibiotics through 08/13/2021.    In the interim, likely due to noncompliance with antibiotic therapy, patient's respiratory status and clinical condition deteriorated with worsening shortness of breath fatigue and pleuritic pain who ultimately presented to our facility on 09/22/2021 with imaging and work-up consistent with ongoing necrotizing pneumonia. Likely from chronic aspiration- IR consulted for thoracentesis but nothing to go after.  NGT placed due to profound speech/swallow failure monitoring for refeeding syndrome given his low BMI at 12-14 He's Aox4, requesting to discuss with hospice/palliative care.     Assessment & Plan    Principal Problem: Severe sepsis from multifocal pneumonia, POA Necrotizing multifocal pneumonia with concurrent pleural effusion -Likely due to chronic aspiration, pleural effusion not amenable for IR thoracentesis -Patient has completed vancomycin and cefepime -Continue midodrine   Active Problems: Dysphagia, severe, chronic aspiration, POA -Patient not felt to be able to safely swallow based on multiple SLP  evaluations. -Patient has refused to artificial feeding all of the nutritional options, alert and oriented x4 and has capacity -Palliative medicine consulted.  -Please see below, not safe to be discharged home  Diabetes mellitus type 2, IDDM, uncontrolled with hyperglycemia -Hemoglobin A1c 12.5 -  Recent Labs    10/04/21 1110 10/04/21 1622 10/04/21 2148 10/05/21 0746 10/05/21 0831 10/05/21 1149  GLUCAP 434* 243* 354* 68* 105* 335*   - continue Semglee 23 units at bedtime, NovoLog meal coverage 8 units 3 times daily AC, continue SSI resistant    Hypokalemia, hypomagnesemia:  -Replaced   Polysubstance abuse: UDS positive for opiates, amphetamines during last 2 hospitalizations at [facility] in April he tested positive for dilaudid, oxymorphone, oxycodone and morphine. -Cessation counseling  Hypotension -Asymptomatic, continue midodrine   Anemia of chronic disease -H&H stable   Chronic thrombocytopenia:  -Platelets at baseline, continue to monitor  Pressure Injury Documentation: Pressure Injury 09/23/21 Perineum Medial Stage 2 -  Partial thickness loss of dermis presenting as a shallow open injury with a red, pink wound bed without slough. (Active)  -Wound care per nursing  Severe protein calorie malnutrition -Per RD, severe fat depletion, severe muscle depletion with chronic illness, underweight -Estimated body mass index is 14.19 kg/m as calculated from the following:   Height as of this encounter: 5\' 9"  (  1.753 m).   Weight as of this encounter: 43.6 kg.  Code Status: DNR DVT Prophylaxis:  enoxaparin (LOVENOX) injection 40 mg Start: 09/22/21 2115   Level of Care: Level of care: Med-Surg Family Communication: Spoke with patient's brother, Antonio [name] on 6/29 who stated that Antonio [name]/patient has done this several times, leaves AMA and is unable to take care of himself.  He had health power of attorney for Mr. Antonio [name] and is currently trying to find those  papers to assist [place] with the disposition.  Disposition Plan:      Remains inpatient appropriate: TBD Patient has been wanting to go home however has no family support or home.  He was staying with a friend.  TOC was able to reach his brother, [name] who is having his own health issues including placement and cannot live with him.  Patient was living with his brother, Antonio [name] who caring for his girlfriend who is quadriplegic and has " a lot going on" and unable to care for Mr. Blount.  Unsafe discharge, patient is barely able to stand up, very deconditioned, unable to care for himself PT evaluation recommended SNF  Procedures:    Consultants:   Palliative medicine  Antimicrobials:    Medications  enoxaparin (LOVENOX) injection  40 mg Subcutaneous Q24H   feeding supplement (NEPRO CARB STEADY)  237 mL Oral TID BM   insulin aspart  0-15 Units Subcutaneous TID WC   insulin aspart  0-5 Units Subcutaneous QHS   insulin aspart  8 Units Subcutaneous TID WC   insulin glargine-yfgn  23 Units Subcutaneous QHS   midodrine  5 mg Oral TID WC   sodium chloride flush  3 mL Intravenous Q12H      Subjective:   Antonio [name] was seen and examined today.  Asking to go home.  CBGs brittle, hyperand hypoglycemia.  Very deconditioned and cachectic Objective:   Vitals:   10/04/21 0512 10/04/21 1425 10/04/21 2100 10/05/21 0616  BP: 112/83 110/78 99/72 99/72   Pulse:  90    Resp: 16 17    Temp: 97.9 F (36.6 C) 98 F (36.7 C) 98.7 F (37.1 C) 98.4 F (36.9 C)  TempSrc: Oral Oral Axillary Oral  SpO2:  99%    Weight:      Height:        Intake/Output Summary (Last 24 hours) at 10/05/2021 1509 Last data filed at 10/05/2021 1300 Gross per 24 hour  Intake 480 ml  Output --  Net 480 ml     Wt Readings from Last 3 Encounters:  09/28/21 43.6 kg  09/13/20 54 kg  07/09/11 53.6 kg    Physical Exam General: Alert and oriented x 3, NAD, ill-appearing Cardiovascular: S1 S2 clear, RRR. No pedal  edema b/l Respiratory: CTAB, no wheezing, rales or rhonchi Gastrointestinal: Soft, nontender, nondistended, NBS Ext: no pedal edema bilaterally Neuro: no new deficits    Data Reviewed:  I have personally reviewed following labs    CBC Lab Results  Component Value Date   WBC 5.9 10/01/2021   RBC 2.73 (L) 10/01/2021   HGB 8.8 (L) 10/01/2021   HCT 26.4 (L) 10/01/2021   MCV 96.7 10/01/2021   MCH 32.2 10/01/2021   PLT 170 10/01/2021   MCHC 33.3 10/01/2021   RDW 13.7 10/01/2021   LYMPHSABS 1.7 09/22/2021   MONOABS 0.4 09/22/2021   EOSABS 0.0 09/22/2021   BASOSABS 0.1 09/22/2021     Last metabolic panel Lab Results  Component Value Date  NA 141 10/05/2021   K 3.3 (L) 10/05/2021   CL 104 10/05/2021   CO2 30 10/05/2021   BUN 13 10/05/2021   CREATININE 0.55 (L) 10/05/2021   GLUCOSE 174 (H) 10/05/2021   GFRNONAA >60 10/05/2021   GFRAA >90 07/12/2011   CALCIUM 8.7 (L) 10/05/2021   PHOS 3.0 10/03/2021   PROT 6.4 (L) 10/02/2021   ALBUMIN 1.9 (L) 10/02/2021   BILITOT 0.5 10/02/2021   ALKPHOS 108 10/02/2021   AST 39 10/02/2021   ALT 29 10/02/2021   ANIONGAP 7 10/05/2021    CBG (last 3)  Recent Labs    10/05/21 0746 10/05/21 0831 10/05/21 1149  GLUCAP 68* 105* 335*          Merlon Alcorta M.D. [name] 10/05/2021, 3:09 PM  Available via Epic secure chat 7am-7pm After 7 pm, please refer to night coverage provider listed on amion.

## 2021-10-05 NOTE — NC FL2 (Signed)
[place] MEDICAID FL2 LEVEL OF CARE SCREENING TOOL     IDENTIFICATION  Patient Name: Antonio [name] Birthdate: 10/24/1970 Sex: male Admission Date (Current Location): 09/22/2021  [facility] and [place] Number:  Producer, television/film/video and Address:  [facility], [address], [place], [place] [address]      Provider Number: [id number]  Attending Physician Name and Address:  Cathren Harsh, MD  Relative Name and Phone Number:       Current Level of Care: Hospital Recommended Level of Care: Skilled Nursing Facility Prior Approval Number:    Date Approved/Denied:   PASRR Number: [id number]  Discharge Plan: SNF    Current Diagnoses: Patient Active Problem List   Diagnosis Date Noted   Pressure injury of skin 09/23/2021   Multifocal pneumonia 09/22/2021   Polysubstance abuse (HCC) 09/22/2021   Uncontrolled type 2 diabetes mellitus with hyperglycemia, without long-term current use of insulin (HCC) 09/22/2021   Pleural effusion, left 09/22/2021   Protein-calorie malnutrition, severe (HCC) 09/22/2021   IVDU (intravenous drug user)    Acute respiratory failure with hypoxia (HCC)    Hip pain, chronic, right 09/15/2020   MDD (major depressive disorder), recurrent episode, moderate (HCC) 09/13/2020   Pancreatitis, acute 07/09/2011   Peritonitis (HCC) 07/09/2011   Alcoholism (HCC) 07/09/2011   Leukocytosis 07/09/2011   Anemia 06/29/2011   SBP (spontaneous bacterial peritonitis) (HCC) 06/27/2011   Failure to thrive in childhood 06/22/2011   Ascites 06/22/2011   Tachycardia 06/21/2011   Cirrhosis (HCC) 06/21/2011   Coagulopathy (HCC) 06/21/2011   Cachexia (HCC) 06/21/2011   Hyponatremia 06/21/2011    Orientation RESPIRATION BLADDER Height & Weight     Self, Time, Situation, Place  O2 (3L [place]) Incontinent Weight: 96 lb 1.9 oz (43.6 kg) Height:  5\' 9"  (175.3 cm)  BEHAVIORAL SYMPTOMS/MOOD NEUROLOGICAL BOWEL NUTRITION STATUS      Incontinent Diet (DIET  DYS 3; thin liquids)  AMBULATORY STATUS COMMUNICATION OF NEEDS Skin   Limited Assist Verbally                         Personal Care Assistance Level of Assistance  Bathing, Dressing, Feeding Bathing Assistance: Limited assistance Feeding assistance: Limited assistance Dressing Assistance: Limited assistance     Functional Limitations Info  Sight, Hearing, Speech Sight Info: Adequate Hearing Info: Adequate Speech Info: Impaired    SPECIAL CARE FACTORS FREQUENCY   (Hospice to follow at SNF)                    Contractures Contractures Info: Not present    Additional Factors Info  Code Status Code Status Info: DNR             Current Medications (10/05/2021):  This is the current hospital active medication list Current Facility-Administered Medications  Medication Dose Route Frequency Provider Last Rate Last Admin   acetaminophen (TYLENOL) tablet 650 mg  650 mg Oral Q6H PRN Opyd, 10/07/2021, MD       Or   acetaminophen (TYLENOL) suppository 650 mg  650 mg Rectal Q6H PRN Opyd, Lavone Neri, MD       enoxaparin (LOVENOX) injection 40 mg  40 mg Subcutaneous Q24H Opyd, Lavone Neri, MD   40 mg at 10/04/21 2204   feeding supplement (NEPRO CARB STEADY) liquid 237 mL  237 mL Oral TID BM 2205, MD   237 mL at 10/05/21 1027   guaiFENesin (ROBITUSSIN) 100 MG/5ML liquid 5  mL  5 mL Oral Q4H PRN Opyd, Lavone Neri, MD   5 mL at 09/25/21 1401   ibuprofen (ADVIL) tablet 600 mg  600 mg Oral Q4H PRN Azucena Fallen, MD   600 mg at 10/04/21 1724   insulin aspart (novoLOG) injection 0-15 Units  0-15 Units Subcutaneous TID WC Tyrone Nine, MD   5 Units at 10/04/21 1717   insulin aspart (novoLOG) injection 0-5 Units  0-5 Units Subcutaneous QHS Tyrone Nine, MD   5 Units at 10/04/21 2205   insulin aspart (novoLOG) injection 8 Units  8 Units Subcutaneous TID WC Rai, Ripudeep K, MD   8 Units at 10/04/21 1718   insulin glargine-yfgn (SEMGLEE) injection 23 Units  23 Units  Subcutaneous QHS Rai, Ripudeep K, MD   23 Units at 10/04/21 2205   magnesium sulfate IVPB 2 g 50 mL  2 g Intravenous Once Tyrone Nine, MD       midodrine (PROAMATINE) tablet 5 mg  5 mg Oral TID WC Rai, Ripudeep K, MD   5 mg at 10/05/21 0845   ondansetron (ZOFRAN) tablet 4 mg  4 mg Oral Q6H PRN Opyd, Lavone Neri, MD       Or   ondansetron (ZOFRAN) injection 4 mg  4 mg Intravenous Q6H PRN Opyd, Lavone Neri, MD   4 mg at 09/27/21 2121   Oral care mouth rinse  15 mL Mouth Rinse PRN Pahwani, Rinka R, MD       senna-docusate (Senokot-S) tablet 1 tablet  1 tablet Oral QHS PRN Opyd, Lavone Neri, MD       sodium chloride flush (NS) 0.9 % injection 3 mL  3 mL Intravenous Q12H Opyd, Lavone Neri, MD   3 mL at 10/04/21 2200     Discharge Medications: Please see discharge summary for a list of discharge medications.  Relevant Imaging Results:  Relevant Lab Results:   Additional Information SS# [id number]  Deatra Robinson, [place]

## 2021-10-05 NOTE — Evaluation (Signed)
Occupational Therapy Evaluation Patient Details Name: Antonio [name] MRN: [id number] DOB: 05/15/1970 Today's Date: 10/05/2021   History of Present Illness 51 y/o male admitted 6/17 with worsening SOB, fatigue and pleuritic pain with recent dx on necrotizing PNA.  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  Also, with decreased dynamic standing balance requiring the use of a seat in the shower as well as grab bars for support.  Feel he will benefit from acute care OT to address these deficits in order to increased ADL levels to supervision.       Recommendations for follow up therapy are one component of a multi-disciplinary discharge planning process, led by the attending physician.  Recommendations may be updated based on patient status, additional functional criteria and insurance authorization.   Follow Up Recommendations  Skilled nursing-short term rehab (<3 hours/day)    Assistance Recommended at Discharge Frequent or constant Supervision/Assistance  Patient can return home with the following A little help with walking and/or transfers;A little help with bathing/dressing/bathroom;Assistance with cooking/housework;Direct supervision/assist for financial management;Assist for transportation;Help with stairs or ramp for entrance    Functional Status Assessment  Patient has had a recent decline in their functional status and demonstrates the ability to make significant improvements in function in a reasonable and predictable amount of time.  Equipment Recommendations  Other (comment) (defer to SNF)       Precautions / Restrictions Precautions Precautions: Fall Precaution Comments: fatigues rapidly, impulsive Restrictions Weight Bearing Restrictions: No      Mobility Bed  Mobility Overal bed mobility: Modified Independent                  Transfers Overall transfer level: Needs assistance Equipment used: Rolling walker (2 wheels) Transfers: Sit to/from Stand, Bed to chair/wheelchair/BSC Sit to Stand: Min guard     Step pivot transfers: Min assist            Balance Overall balance assessment: Needs assistance Sitting-balance support: No upper extremity supported, Feet supported Sitting balance-Leahy Scale: Fair     Standing balance support: Bilateral upper extremity supported, Reliant on assistive device for balance Standing balance-Leahy Scale: Poor Standing balance comment: Needs assist for dynamic standing balance with use of a RW for support.                           ADL either performed or assessed with clinical judgement   ADL Overall ADL's : Needs assistance/impaired Eating/Feeding: Independent;Bed level   Grooming: Wash/dry hands;Wash/dry face;Min guard;Standing   Upper Body Bathing: Set up;Sitting   Lower Body Bathing: Min guard;Sit to/from stand Lower Body Bathing Details (indicate cue type and reason): grab bar for support in the shower Upper Body Dressing : Set up;Sitting Upper Body Dressing Details (indicate cue type and reason): hospital gown Lower Body Dressing: Minimal assistance;Sit to/from stand   Toilet Transfer: Minimal assistance;Ambulation;Rolling walker (2 wheels)   Toileting- Clothing Manipulation and Hygiene: Minimal assistance;Sit to/from stand   Tub/ Shower Transfer: Walk-in shower;Minimal assistance;Ambulation;Rolling walker (2 wheels)   Functional mobility during ADLs: Minimal assistance General ADL Comments: Pt impulsive with use of the RW during mobility.  Mod instructional cueing to keep the walker in front of him when completing turns as well as cueing to reach back when  he goes to sit.     Vision Baseline Vision/History:  (reports that he needs to go to the Dr for checkup and  likely glasses but has not.) Ability to See in Adequate Light: 0 Adequate Patient Visual Report: No change from baseline Vision Assessment?: No apparent visual deficits     Perception Perception Perception: Within Functional Limits   Praxis Praxis Praxis: Intact    Pertinent Vitals/Pain Pain Assessment Pain Assessment: Faces Pain Score: 0-No pain     Hand Dominance Right   Extremity/Trunk Assessment Upper Extremity Assessment Upper Extremity Assessment: Generalized weakness   Lower Extremity Assessment Lower Extremity Assessment: Defer to PT evaluation   Cervical / Trunk Assessment Cervical / Trunk Assessment: Normal   Communication Communication Communication: Other (comment) (dysarthric)   Cognition Arousal/Alertness: Awake/alert Behavior During Therapy: Impulsive Overall Cognitive Status: No family/caregiver present to determine baseline cognitive functioning Area of Impairment: Orientation                 Orientation Level: Disoriented to, Time (missed month by only one day, stated "July")   Memory: Decreased short-term memory   Safety/Judgement: Decreased awareness of safety Awareness: Emergent   General Comments: Pt with decreased awareness overall.  when asked if he felt like he was off balance he stated "No", even though it was clear that there were impairments when ambulating to the bathroom.                Home Living Family/patient expects to be discharged to:: Skilled nursing facility Living Arrangements: Other (Comment) (Pt unable to go back to his prior living setup secondary to not paying rent.) Available Help at Discharge:  (Unsure)                                    Prior Functioning/Environment Prior Level of Function : Independent/Modified Independent             Mobility Comments: Difficulty mobility with a cane.  Pt wishes a w/c and cushion going forward.          OT Problem List: Decreased  strength;Decreased knowledge of use of DME or AE;Impaired balance (sitting and/or standing);Decreased safety awareness      OT Treatment/Interventions: Self-care/ADL training;Balance training;Therapeutic activities;DME and/or AE instruction;Cognitive remediation/compensation;Neuromuscular education;Therapeutic exercise;Patient/family education    OT Goals(Current goals can be found in the care plan section) Acute Rehab OT Goals Patient Stated Goal: Pt hopes to get out of the hospital today OT Goal Formulation: With patient Time For Goal Achievement: 10/19/21 Potential to Achieve Goals: Good  OT Frequency: Min 2X/week       AM-PAC OT "6 Clicks" Daily Activity     Outcome Measure Help from another person eating meals?: None Help from another person taking care of personal grooming?: A Little Help from another person toileting, which includes using toliet, bedpan, or urinal?: A Little Help from another person bathing (including washing, rinsing, drying)?: A Little Help from another person to put on and taking off regular upper body clothing?: None Help from another person to put on and taking off regular lower body clothing?: A Little 6 Click Score: 20   End of Session Equipment Utilized During Treatment: Rolling walker (2 wheels) Nurse Communication: Mobility status  Activity Tolerance: Patient tolerated treatment well Patient left: in bed;with call bell/phone within reach;with bed alarm set  OT Visit Diagnosis: Unsteadiness on feet (R26.81);Muscle weakness (generalized) (M62.81);Other symptoms and signs  involving cognitive function                Time: 8588-5027 OT Time Calculation (min): 25 min Charges:  OT General Charges $OT Visit: 1 Visit OT Evaluation $OT Eval Moderate Complexity: 1 Mod OT Treatments $Self Care/Home Management : 8-22 mins Lanea Vankirk OTR/L 10/05/2021, 8:49 AM

## 2021-10-06 DIAGNOSIS — R0902 Hypoxemia: Secondary | ICD-10-CM | POA: Diagnosis not present

## 2021-10-06 DIAGNOSIS — F331 Major depressive disorder, recurrent, moderate: Secondary | ICD-10-CM | POA: Diagnosis not present

## 2021-10-06 DIAGNOSIS — J189 Pneumonia, unspecified organism: Secondary | ICD-10-CM | POA: Diagnosis not present

## 2021-10-06 DIAGNOSIS — F1990 Other psychoactive substance use, unspecified, uncomplicated: Secondary | ICD-10-CM | POA: Diagnosis not present

## 2021-10-06 LAB — GLUCOSE, CAPILLARY
Glucose-Capillary: 81 mg/dL (ref 70–99)
Glucose-Capillary: 121 mg/dL — ABNORMAL HIGH (ref 70–99)
Glucose-Capillary: 62 mg/dL — ABNORMAL LOW (ref 70–99)
Glucose-Capillary: 77 mg/dL (ref 70–99)
Glucose-Capillary: 143 mg/dL — ABNORMAL HIGH (ref 70–99)
Glucose-Capillary: 188 mg/dL — ABNORMAL HIGH (ref 70–99)

## 2021-10-06 LAB — CREATININE, SERUM
Creatinine, Ser: 0.53 mg/dL — ABNORMAL LOW (ref 0.61–1.24)
GFR, Estimated: >60 mL/min (ref >60)

## 2021-10-06 NOTE — Progress Notes (Signed)
[name]                                                                              Antonio [name], is a 51 y.o. male, DOB - [birth year], RUE:[id number] Admit date - 09/22/2021    Outpatient Primary MD for the patient is Gabriel Cirri, DO  LOS - 14  days  Chief Complaint  Patient presents with   Altered Mental Status       Brief summary   51 y.o. male with medical history significant for alcoholism (reportedly in remission), history of DVT and PE, chronic pancreatitis, IV drug abuse in early remission, uncontrolled type 2 diabetes mellitus, chronic hepatitis C, and necrotizing pneumonia who presents with worsening shortness of breath, fatigue, and pleuritic pain after previously leaving [facility] for similar episode and diagnosis AGAINST MEDICAL ADVICE on August 01, 2021.  He was initially planned for IV antibiotics through 08/13/2021.    In the interim, likely due to noncompliance with antibiotic therapy, patient's respiratory status and clinical condition deteriorated with worsening shortness of breath fatigue and pleuritic pain who ultimately presented to our facility on 09/22/2021 with imaging and work-up consistent with ongoing necrotizing pneumonia. Likely from chronic aspiration- IR consulted for thoracentesis but nothing to go after.  NGT placed due to profound speech/swallow failure monitoring for refeeding syndrome given his low BMI at 12-14 He's Aox4, requesting to discuss with hospice/palliative care.     Assessment & Plan    Principal Problem: Severe sepsis from multifocal pneumonia, POA Necrotizing multifocal pneumonia with concurrent pleural effusion -Likely due to chronic aspiration, pleural effusion not amenable for IR thoracentesis -Patient has completed vancomycin and cefepime -Continue midodrine   Active Problems: Dysphagia, severe, chronic aspiration, POA -Patient not felt to be able to safely swallow based on multiple SLP  evaluations. -Patient has refused to artificial feeding all of the nutritional options, alert and oriented x4 and has capacity -Palliative medicine consulted.  -Please see below, not safe to be discharged home  Diabetes mellitus type 2, IDDM, uncontrolled with hyperglycemia -Hemoglobin A1c 12.5 -  Recent Labs    10/05/21 0831 10/05/21 1149 10/05/21 1559 10/05/21 2139 10/06/21 0754 10/06/21 1201  GLUCAP 105* 335* 192* 94 81 121*   -CBG stable, continue current regimen.  Brittle, would not increase insulin further due to risk of hypoglycemia     Polysubstance abuse: UDS positive for opiates, amphetamines during last 2 hospitalizations at [facility] in April he tested positive for dilaudid, oxymorphone, oxycodone and morphine. -Cessation counseling  Hypotension -Asymptomatic, continue midodrine   Anemia of chronic disease -H&H stable   Chronic thrombocytopenia:  -Platelets at baseline, continue to monitor  Pressure Injury Documentation: Pressure Injury 09/23/21 Perineum Medial Stage 2 -  Partial thickness loss of dermis presenting as a shallow open injury with a red, pink wound bed without slough. (Active)  -Wound care per nursing  Severe protein calorie malnutrition -Per RD, severe fat depletion, severe muscle depletion with chronic illness, underweight -Estimated body mass index is 14.19 kg/m as calculated from the following:   Height as of this encounter: 5\' 9"  (1.753 m).   Weight as of  this encounter: 43.6 kg.  Code Status: DNR DVT Prophylaxis:  enoxaparin (LOVENOX) injection 40 mg Start: 09/22/21 2115   Level of Care: Level of care: Med-Surg Family Communication: Spoke with patient's brother, Mr. Collie Wernick on 6/29 who stated that Mr. Caffee/patient has done this several times, leaves AMA and is unable to take care of himself.  He had health power of attorney for Mr. Auletta and is currently trying to find those papers to assist [place] with the disposition.   Disposition Plan:      Remains inpatient appropriate: TBD Patient has been wanting to go home however has no family support or home.  He was staying with a friend.  TOC was able to reach his brother, [name] who is having his own health issues including placement and cannot live with him.  Patient was living with his brother, [name] who caring for his girlfriend who is quadriplegic and has " a lot going on" and unable to care for Mr. Vachon.  Unsafe discharge, patient is barely able to stand up, very deconditioned, unable to care for himself PT evaluation recommended SNF  Procedures:    Consultants:   Palliative medicine  Antimicrobials:    Medications  enoxaparin (LOVENOX) injection  40 mg Subcutaneous Q24H   feeding supplement (NEPRO CARB STEADY)  237 mL Oral TID BM   insulin aspart  0-15 Units Subcutaneous TID WC   insulin aspart  0-5 Units Subcutaneous QHS   insulin aspart  8 Units Subcutaneous TID WC   insulin glargine-yfgn  23 Units Subcutaneous QHS   magnesium oxide  400 mg Oral BID   midodrine  5 mg Oral TID WC   sodium chloride flush  3 mL Intravenous Q12H      Subjective:   Adib Wahba was seen and examined today.  Continues to ask to go home although he does not have a home.  CBGs improving, deconditioned and cachectic No acute issues.  Objective:   Vitals:   10/04/21 1425 10/04/21 2100 10/05/21 0616 10/05/21 1500  BP: 110/78 99/72 99/72  99/72  Pulse: 90   98  Resp: 17   19  Temp:   98.4 F (36.9 C) 97.9 F (36.6 C)  TempSrc: Oral Axillary Oral Oral  SpO2: 99%   98%  Weight:      Height:        Intake/Output Summary (Last 24 hours) at 10/06/2021 1246 Last data filed at 10/06/2021 0542 Gross per 24 hour  Intake 240 ml  Output 600 ml  Net -360 ml     Wt Readings from Last 3 Encounters:  09/28/21 43.6 kg  09/13/20 54 kg  07/09/11 53.6 kg   Physical Exam General: Alert and oriented x 3, NAD, cachectic and ill-appearing Cardiovascular: S1 S2  clear, RRR. No pedal edema b/l Respiratory: CTAB, no wheezing, rales or rhonchi Gastrointestinal: Soft, nontender, nondistended, NBS Ext: no pedal edema bilaterally   Data Reviewed:  I have personally reviewed following labs    CBC Lab Results  Component Value Date   WBC 5.9 10/01/2021   RBC 2.73 (L) 10/01/2021   HGB 8.8 (L) 10/01/2021   HCT 26.4 (L) 10/01/2021   MCV 96.7 10/01/2021   MCH 32.2 10/01/2021   PLT 170 10/01/2021   MCHC 33.3 10/01/2021   RDW 13.7 10/01/2021   LYMPHSABS 1.7 09/22/2021   MONOABS 0.4 09/22/2021   EOSABS 0.0 09/22/2021   BASOSABS 0.1 09/22/2021     Last metabolic panel Lab Results  Component Value Date  NA 141 10/05/2021   K 3.3 (L) 10/05/2021   CL 104 10/05/2021   CO2 30 10/05/2021   BUN 13 10/05/2021   CREATININE 0.53 (L) 10/06/2021   GLUCOSE 174 (H) 10/05/2021   GFRNONAA >60 10/06/2021   GFRAA >90 07/12/2011   CALCIUM 8.7 (L) 10/05/2021   PHOS 3.0 10/03/2021   PROT 6.4 (L) 10/02/2021   ALBUMIN 1.9 (L) 10/02/2021   BILITOT 0.5 10/02/2021   ALKPHOS 108 10/02/2021   AST 39 10/02/2021   ALT 29 10/02/2021   ANIONGAP 7 10/05/2021    CBG (last 3)  Recent Labs    10/05/21 2139 10/06/21 0754 10/06/21 1201  GLUCAP 94 81 121*          Jahkeem Kurka M.D. [name] 10/06/2021, 12:46 PM  Available via Epic secure chat 7am-7pm After 7 pm, please refer to night coverage provider listed on amion.

## 2021-10-06 NOTE — Progress Notes (Addendum)
Patient sweating profusely. Given 10 units of insulin for cbg 121, 2 units for cbg 121 and 8 units for meal coverage. Ate 100% of lunch. When checked, sweating and cbg taken, was 62. Given snacks. Dr. Isidoro Donning paged. Will recheck cbg.  Recheck cbg 77 will give more snacks.Feels weak but not sweating resolved at this time. Paged Dr. Isidoro Donning with recheck results.

## 2021-10-07 DIAGNOSIS — R0902 Hypoxemia: Secondary | ICD-10-CM | POA: Diagnosis not present

## 2021-10-07 DIAGNOSIS — F331 Major depressive disorder, recurrent, moderate: Secondary | ICD-10-CM | POA: Diagnosis not present

## 2021-10-07 DIAGNOSIS — F1990 Other psychoactive substance use, unspecified, uncomplicated: Secondary | ICD-10-CM | POA: Diagnosis not present

## 2021-10-07 DIAGNOSIS — J189 Pneumonia, unspecified organism: Secondary | ICD-10-CM | POA: Diagnosis not present

## 2021-10-07 LAB — GLUCOSE, CAPILLARY
Glucose-Capillary: 281 mg/dL — ABNORMAL HIGH (ref 70–99)
Glucose-Capillary: 178 mg/dL — ABNORMAL HIGH (ref 70–99)
Glucose-Capillary: 225 mg/dL — ABNORMAL HIGH (ref 70–99)
Glucose-Capillary: 228 mg/dL — ABNORMAL HIGH (ref 70–99)

## 2021-10-07 MED ORDER — INSULIN ASPART 100 UNIT/ML IJ SOLN
6.0000 [IU] | Freq: Three times a day (TID) | INTRAMUSCULAR | Status: DC
  Administered 2021-10-07 (×3): 6 [IU] via SUBCUTANEOUS

## 2021-10-07 MED ORDER — INSULIN GLARGINE-YFGN 100 UNIT/ML ~~LOC~~ SOLN
20.0000 [IU] | Freq: Every day | SUBCUTANEOUS | Status: DC
  Administered 2021-10-07: 20 [IU] via SUBCUTANEOUS
  Filled 2021-10-07 (×2): qty 0.2

## 2021-10-07 NOTE — Progress Notes (Signed)
[name]                                                                              Antonio [name], is a 51 y.o. male, DOB - [birth year], ZDG:[id number] Admit date - 09/22/2021    Outpatient Primary MD for the patient is Gabriel Cirri, DO  LOS - 15  days  Chief Complaint  Patient presents with   Altered Mental Status       Brief summary   51 y.o. 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  He was initially planned for IV antibiotics through 08/13/2021.    In the interim, likely due to noncompliance with antibiotic therapy, patient's respiratory status and clinical condition deteriorated with worsening shortness of breath fatigue and pleuritic pain who ultimately presented to our facility on 09/22/2021 with imaging and work-up consistent with ongoing necrotizing pneumonia. Likely from chronic aspiration- IR consulted for thoracentesis but nothing to go after.  NGT placed due to profound speech/swallow failure monitoring for refeeding syndrome given his low BMI at 12-14 He's Aox4, requesting to discuss with hospice/palliative care.     Assessment & Plan    Principal Problem: Severe sepsis from multifocal pneumonia, POA Necrotizing multifocal pneumonia with concurrent pleural effusion -Likely due to chronic aspiration, pleural effusion not amenable for IR thoracentesis -Patient has completed vancomycin and cefepime -Continue midodrine   Active Problems: Dysphagia, severe, chronic aspiration, POA -Patient not felt to be able to safely swallow based on multiple SLP  evaluations. -Patient has refused to artificial feeding all of the nutritional options -Palliative medicine consulted.  -Please see below, not safe to be discharged home.    Diabetes mellitus type 2, IDDM, uncontrolled with hyperglycemia -Hemoglobin A1c 12.5 Recent Labs    10/06/21 1441 10/06/21 1503 10/06/21 1601 10/06/21 2054 10/07/21 0722 10/07/21 1104  GLUCAP 62* 77 143* 188* 281* 178*   -CBGs brittle and uncontrolled with patient's dietary indiscretions, eating multiple graham crackers.  No further changes in insulin regimen as high risk of hypoglycemia with increasing insulin   Polysubstance abuse: UDS positive for opiates, amphetamines during last 2 hospitalizations at [facility] in April he tested positive for dilaudid, oxymorphone, oxycodone and morphine. -Cessation counseling  Hypotension -Asymptomatic, continue midodrine   Anemia of chronic disease -No bleeding, H&H on 6/26 was stable, will recheck labs in a.m. if patient allows   Chronic thrombocytopenia:  -Platelets at baseline, continue to monitor  Pressure Injury Documentation: Pressure Injury 09/23/21 Perineum Medial Stage 2 -  Partial thickness loss of dermis presenting as a shallow open injury with a red, pink wound bed without slough. (Active)  -Wound care per nursing  Severe protein calorie malnutrition -Per RD, severe fat depletion, severe muscle depletion with chronic illness, underweight -Estimated body mass index is 14.19 kg/m as calculated from the following:  Height as of this encounter: 5\' 9"  (1.753 m).   Weight as of this encounter: 43.6 kg.  Code Status: DNR DVT Prophylaxis:  enoxaparin (LOVENOX) injection 40 mg Start: 09/22/21 2115   Level of Care: Level of care: Med-Surg Family Communication: Spoke with patient's brother, Antonio [name] on 6/29 who stated that Antonio [name]/patient has done this several times, leaves AMA and is unable to take care of himself.  He had health power of  attorney for Antonio [name] and is currently trying to find those papers to assist Antonio [name] with the disposition.  Disposition Plan:      Remains inpatient appropriate: TBD Patient has been wanting to go home however has no family support or home.  He was staying with a friend.  TOC was able to reach his brother, Antonio [name] who is having his own health issues including placement and cannot live with him.  Patient was living with his brother, Antonio [name] who is caring for his girlfriend who is quadriplegic and has " a lot going on" and unable to care for Antonio [name].  Unsafe discharge, patient is barely able to stand up, very deconditioned, unable to care for himself PT evaluation recommended SNF  Procedures:    Consultants:   Palliative medicine  Antimicrobials:    Medications  enoxaparin (LOVENOX) injection  40 mg Subcutaneous Q24H   feeding supplement (NEPRO CARB STEADY)  237 mL Oral TID BM   insulin aspart  0-15 Units Subcutaneous TID WC   insulin aspart  0-5 Units Subcutaneous QHS   insulin aspart  6 Units Subcutaneous TID WC   insulin glargine-yfgn  20 Units Subcutaneous QHS   magnesium oxide  400 mg Oral BID   midodrine  5 mg Oral TID WC      Subjective:   Antonio [name] was seen and examined today.  Continues to ask to go home.  Dietary indiscretions with eating multiple graham crackers and yelling out.   Objective:   Vitals:   10/05/21 1500 10/06/21 1603 10/06/21 2048 10/07/21 0730  BP: 99/72 105/84 93/74 94/72   Pulse: 98 84 81   Resp: 19 18 17    Temp:  97.8 F (36.6 C) 97.8 F (36.6 C) 98.7 F (37.1 C)  TempSrc: Oral Oral Oral Oral  SpO2: 98% 99% 100%   Weight:      Height:        Intake/Output Summary (Last 24 hours) at 10/07/2021 1349 Last data filed at 10/07/2021 1000 Gross per 24 hour  Intake --  Output 1100 ml  Net -1100 ml     Wt Readings from Last 3 Encounters:  09/28/21 43.6 kg  09/13/20 54 kg  07/09/11 53.6 kg    Physical Exam General: Alert and oriented x 3,  NAD, cachectic, ill-appearing Cardiovascular: S1 S2 clear, RRR. No pedal edema b/l Respiratory: CTAB Gastrointestinal: Soft, nontender, nondistended, NBS Ext: no pedal edema bilaterally Neuro: moving all 4 extremities   Data Reviewed:  I have personally reviewed following labs    CBC Lab Results  Component Value Date   WBC 5.9 10/01/2021   RBC 2.73 (L) 10/01/2021   HGB 8.8 (L) 10/01/2021   HCT 26.4 (L) 10/01/2021   MCV 96.7 10/01/2021   MCH 32.2 10/01/2021   PLT 170 10/01/2021   MCHC 33.3 10/01/2021   RDW 13.7 10/01/2021   LYMPHSABS 1.7 09/22/2021   MONOABS 0.4 09/22/2021   EOSABS 0.0 09/22/2021   BASOSABS 0.1 09/22/2021     Last metabolic panel Lab Results  Component Value Date   NA 141 10/05/2021   K 3.3 (L) 10/05/2021   CL 104 10/05/2021   CO2 30 10/05/2021   BUN 13 10/05/2021   CREATININE 0.53 (L) 10/06/2021   GLUCOSE 174 (H) 10/05/2021   GFRNONAA >60 10/06/2021   GFRAA >90 07/12/2011   CALCIUM 8.7 (L) 10/05/2021   PHOS 3.0 10/03/2021   PROT 6.4 (L) 10/02/2021   ALBUMIN 1.9 (L) 10/02/2021   BILITOT 0.5 10/02/2021   ALKPHOS 108 10/02/2021   AST 39 10/02/2021   ALT 29 10/02/2021   ANIONGAP 7 10/05/2021    CBG (last 3)  Recent Labs    10/06/21 2054 10/07/21 0722 10/07/21 1104  GLUCAP 188* 281* 178*          Jailen Lung M.D. [name] 10/07/2021, 1:49 PM  Available via Epic secure chat 7am-7pm After 7 pm, please refer to night coverage provider listed on amion.

## 2021-10-07 NOTE — Progress Notes (Signed)
Patient given 3 cups of coffee and 8 packs of graham crackers over the shift. This AM patient claims he was only given 2 packs and became angry. Banging hands on tray and equipment yelling out. Disturbed other patients and visitors enough for them to come to nursing station. Charge nurse spoken to patient. Helping to order breakfast but will possibly be a reoccuring issue if we try to regulate his snack intake, especially to control his blood sugars.

## 2021-10-08 ENCOUNTER — Emergency Department (HOSPITAL_COMMUNITY)
Admission: EM | Admit: 2021-10-08 | Discharge: 2021-10-08 | Discharge status: Against Medical Advice | Payer: Medicaid Other | Attending: Emergency Medicine | Admitting: Emergency Medicine

## 2021-10-08 DIAGNOSIS — R32 Unspecified urinary incontinence: Secondary | ICD-10-CM | POA: Diagnosis present

## 2021-10-08 DIAGNOSIS — F1990 Other psychoactive substance use, unspecified, uncomplicated: Secondary | ICD-10-CM | POA: Diagnosis not present

## 2021-10-08 DIAGNOSIS — R627 Adult failure to thrive: Secondary | ICD-10-CM | POA: Diagnosis not present

## 2021-10-08 DIAGNOSIS — J189 Pneumonia, unspecified organism: Secondary | ICD-10-CM | POA: Diagnosis not present

## 2021-10-08 DIAGNOSIS — R7309 Other abnormal glucose: Secondary | ICD-10-CM | POA: Diagnosis not present

## 2021-10-08 DIAGNOSIS — Z5321 Procedure and treatment not carried out due to patient leaving prior to being seen by health care provider: Secondary | ICD-10-CM | POA: Insufficient documentation

## 2021-10-08 DIAGNOSIS — R0902 Hypoxemia: Secondary | ICD-10-CM | POA: Diagnosis not present

## 2021-10-08 DIAGNOSIS — F331 Major depressive disorder, recurrent, moderate: Secondary | ICD-10-CM | POA: Diagnosis not present

## 2021-10-08 LAB — BASIC METABOLIC PANEL
Anion gap: 7 (ref 5–15)
BUN: 15 mg/dL (ref 6–20)
CO2: 28 mmol/L (ref 22–32)
Calcium: 8.7 mg/dL — ABNORMAL LOW (ref 8.9–10.3)
Chloride: 103 mmol/L (ref 98–111)
Creatinine, Ser: 0.62 mg/dL (ref 0.61–1.24)
GFR, Estimated: >60 mL/min (ref >60)
Glucose, Bld: 64 mg/dL — ABNORMAL LOW (ref 70–99)
Potassium: 3.9 mmol/L (ref 3.5–5.1)
Sodium: 138 mmol/L (ref 135–145)

## 2021-10-08 LAB — CBC
HCT: 25.6 % — ABNORMAL LOW (ref 39.0–52.0)
Hemoglobin: 8.7 g/dL — ABNORMAL LOW (ref 13.0–17.0)
MCH: 33.3 pg (ref 26.0–34.0)
MCHC: 34 g/dL (ref 30.0–36.0)
MCV: 98.1 fL (ref 80.0–100.0)
Platelets: 189 10*3/uL (ref 150–400)
RBC: 2.61 MIL/uL — ABNORMAL LOW (ref 4.22–5.81)
RDW: 14.6 % (ref 11.5–15.5)
WBC: 5 10*3/uL (ref 4.0–10.5)
nRBC: 0 % (ref 0.0–0.2)

## 2021-10-08 LAB — CBG MONITORING, ED: Glucose-Capillary: 285 mg/dL — ABNORMAL HIGH (ref 70–99)

## 2021-10-08 LAB — GLUCOSE, CAPILLARY: Glucose-Capillary: 81 mg/dL (ref 70–99)

## 2021-10-08 MED ORDER — MAGNESIUM OXIDE -MG SUPPLEMENT 400 (240 MG) MG PO TABS
400.0000 mg | ORAL_TABLET | Freq: Once | ORAL | Status: AC
Start: 2021-10-08 — End: 2021-10-08
  Administered 2021-10-08: 400 mg via ORAL
  Filled 2021-10-08: qty 1

## 2021-10-08 NOTE — ED Triage Notes (Signed)
Pt checked in an hour after leaving AMA from upstairs. Admitted for several weeks due to PNA. Pt disheveled on arrival and when asked why he was here, pt states "I had an accident and I need to be cleaned up. I also need a ride to [place]". Pt denies wanting to check into the hospital.

## 2021-10-08 NOTE — ED Provider Triage Note (Signed)
Emergency Medicine Provider Triage Evaluation Note  Antonio [name] , a 51 y.o. male  was evaluated in triage.  Pt complains of with past medical history significant for polysubstance abuse, he was recently just hospitalized for the last 2 weeks regarding multifocal pneumonia and left AMA this morning citing "2 weeks is long enough".  Patient attempted to leave the hospital but checked back in because he has no way to get a ride to [place] where he lives.  Patient was incontinent of stool and hoping to be cleaned up as well.  Patient denies any pain at this time but does not know where to go or what to do..  Review of Systems  Positive: Incontinence, social issues Negative: Fever, chills, difficulty breathing, chest pain, abdominal pain  Physical Exam  BP 98/77 (BP Location: Right Arm)   Pulse (!) 118   Temp 98.3 F (36.8 C) (Oral)   Resp 18   SpO2 99%  Gen:   Awake, no distress , foul-smelling, disheveled Resp:  Normal effort  MSK:   Moves extremities without difficulty  Other:    Medical Decision Making  Medically screening exam initiated at 1:04 PM.  Appropriate orders placed.  Antonio [name] was informed that the remainder of the evaluation will be completed by another provider, this initial triage assessment does not replace that evaluation, and the importance of remaining in the ED until their evaluation is complete.    Olene Floss, PA-C 10/08/21 1306

## 2021-10-08 NOTE — Progress Notes (Addendum)
Patient expressed desire to leave the hospital against medical advice. Patient is alert and oriented x4. RN, charge nurse, TOC case manager, and Water quality scientist all attempted to explain risks of leaving the hospital and asked patient to stay. RN called patient's brother, Arlys John, to ask if a ride could be obtained for the patient. Arlys John states that no one can pick up the patient at this time. Patient continued to express desire to leave AMA. Patient signed AMA paperwork and was escorted to the emergency room exit by unit director. RN notified patient's brother, Arlys John, that patient left the hospital. RN notified attending, Dr. Isidoro Donning, that patient left the hospital.

## 2021-10-08 NOTE — Discharge Summary (Addendum)
AMA NOTE    Patient ID: Antonio [name] MRN: [id number] DOB/AGE: 1971-02-25 51 y.o.  Admit date: 09/22/2021 Discharge date: 10/08/2021   PLEASE NOTE THAT PATIENT LEFT AGAINST MEDICAL ADVICE. Risks of sepsis, hyperglycemia/hypoglycemia, death were explained in detail to the patient and he verbally understood the risks of leaving AMA.   Primary Care Physician:  Gabriel Cirri, DO  Discharge Diagnoses:     Multifocal pneumonia  MDD (major depressive disorder), recurrent episode, moderate (HCC)  Polysubstance abuse (HCC)  Uncontrolled type 2 diabetes mellitus with hyperglycemia,  Pleural effusion, left  Protein-calorie malnutrition, severe (HCC)  Severe medical noncompliance   Consults:  Palliative medicine   Recommendations for Outpatient Follow-up:  Patient left AMA  TESTS THAT NEED FOLLOW-UP None   DIET: Carb modified diet    Allergies:   Allergies  Allergen Reactions   Acetaminophen     Other reaction(s): Other (See Comments) Liver issues     Discharge Medications: Please note that patient left AMA (against medical advice)   Brief H and P: For complete details please refer to admission H and P, but in brief 51 y.o. 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  He was initially planned for IV antibiotics through 08/13/2021.    In the interim, likely due to noncompliance with antibiotic therapy, patient's respiratory status and clinical condition deteriorated with worsening shortness of breath fatigue and pleuritic pain who ultimately presented to our facility on 09/22/2021 with  imaging and work-up consistent with ongoing necrotizing pneumonia. Likely from chronic aspiration- IR consulted for thoracentesis but nothing to go after.  NGT placed due to profound speech/swallow failure monitoring for refeeding syndrome given his low BMI at 12-14   Hospital Course:  Severe sepsis from multifocal pneumonia, POA Necrotizing multifocal pneumonia with concurrent pleural effusion -Likely due to chronic aspiration, pleural effusion not amenable for IR thoracentesis -Patient has completed vancomycin and cefepime.  Patient needs to continue midodrine however he left AGAINST MEDICAL ADVICE and walked out.  He is fully alert and oriented and understands the risk of sepsis, continued aspiration, death.    Dysphagia, severe, chronic aspiration, POA -Patient not felt to be able to safely swallow based on multiple SLP evaluations. -Patient has refused to artificial feeding all of the nutritional options -Palliative medicine was consulted, patient understands the risk of continued risk of pneumonia with chronic aspiration.  Diabetes mellitus type 2, IDDM, uncontrolled with hyperglycemia -Hemoglobin A1c 12.5  -CBGs brittle and uncontrolled due to patient's dietary indiscretions, eating multiple graham crackers.  No further changes in insulin regimen as high risk of hypoglycemia with increasing insulin   Polysubstance abuse: UDS positive for opiates, amphetamines during last 2 hospitalizations at [facility] in April he tested positive for dilaudid, oxymorphone, oxycodone and morphine. -Patient was counseled on cessation  Severe medical noncompliance Patient has history of severe medical noncompliance, leaving AMA from previous admissions as well.  I have discussed it with his brother, [name] in detail who had requested patient to be placed as he has no family support. Patient is alert and oriented x4 with understanding of his actions, leaving AMA, risk of sepsis,  hyper/hypoglycemia, pneumonia, death.  TOC had been  assisting with SNF search for safe discharge however patient signed out AGAINST MEDICAL ADVICE.    Hypotension -Asymptomatic, continue midodrine   Anemia of chronic disease -H&H stable, 8.7 platelet   Chronic thrombocytopenia:  -Platelets at baseline   Pressure Injury Documentation: Pressure Injury 09/23/21 Perineum Medial Stage 2 -  Partial thickness loss of dermis presenting as a shallow open injury with a red, pink wound bed without slough. (Active)     Severe protein calorie malnutrition -Per RD, severe fat depletion, severe muscle depletion with chronic illness, underweight -Estimated body mass index is 14.19 kg/m as calculated from the following:   Height as of this encounter: 5\' 9"  (1.753 m).   Weight as of this encounter: 43.6 kg.   Patient was seen and examined earlier this a.m.  He had no complaints except that he wants to leave ASAP and go "home".  BP 96/68 (BP Location: Left Arm)   Pulse 88   Temp 97.8 F (36.6 C) (Oral)   Resp 18   Ht 5\' 9"  (1.753 m)   Wt 43.6 kg   SpO2 93%   BMI 14.19 kg/m   Physical Exam: General: Alert and awake oriented x3, demanding to leave AMA, cachectic CVS: S1-S2 clear no murmur rubs or gallops Chest: clear to auscultation bilaterally, no wheezing rales or rhonchi Abdomen: soft nontender, nondistended, normal bowel sounds Extremities: no cyanosis, clubbing or edema noted bilaterally Neuro moving all 4 extremities,   The results of significant diagnostics from this hospitalization (including imaging, microbiology, ancillary and laboratory) are listed below for reference.    LAB RESULTS: Basic Metabolic Panel: Recent Labs  Lab 10/03/21 0138 10/04/21 0354 10/05/21 0012 10/06/21 0713 10/08/21 0518  NA 135  --  141  --  138  K 3.2*  --  3.3*  --  3.9  CL 103  --  104  --  103  CO2 27  --  30  --  28  GLUCOSE 175*  --  174*  --  64*  BUN 13  --  13  --  15  CREATININE  0.59*  --  0.55* 0.53* 0.62  CALCIUM 8.1*  --  8.7*  --  8.7*  MG 1.7   < > 1.5*  --   --   PHOS 3.0  --   --   --   --    < > = values in this interval not displayed.   Liver Function Tests: Recent Labs  Lab 10/02/21 0105  AST 39  ALT 29  ALKPHOS 108  BILITOT 0.5  PROT 6.4*  ALBUMIN 1.9*   No results for input(s): "LIPASE", "AMYLASE" in the last 168 hours. No results for input(s): "AMMONIA" in the last 168 hours. CBC: Recent Labs  Lab 10/08/21 0518  WBC 5.0  HGB 8.7*  HCT 25.6*  MCV 98.1  PLT 189   Cardiac Enzymes: No results for input(s): "CKTOTAL", "CKMB", "CKMBINDEX", "TROPONINI" in the last 168 hours. BNP: Invalid input(s): "POCBNP" CBG: Recent Labs  Lab 10/07/21 2143 10/08/21 0725  GLUCAP 228* 81    Significant Diagnostic Studies:  12/08/21 CHEST (PLEURAL EFFUSION)  Result Date: 09/23/2021 CLINICAL DATA:  Left pleural effusion. EXAM: CHEST ULTRASOUND COMPARISON:  CT 09/22/2021 FINDINGS: Images of the left lung base demonstrate a small amount of left pleural fluid as seen on recent CT. IMPRESSION: Small left pleural effusion. Electronically Signed   By: 09/25/2021 M.D.   On: 09/23/2021 12:24   CT Angio Chest PE W and/or  Wo Contrast  Result Date: 09/22/2021 CLINICAL DATA:  Pulmonary embolism suspected. High probability of septic emboli. Possible PE. EXAM: CT ANGIOGRAPHY CHEST WITH CONTRAST TECHNIQUE: Multidetector CT imaging of the chest was performed using the standard protocol during bolus administration of intravenous contrast. Multiplanar CT image reconstructions and MIPs were obtained to evaluate the vascular anatomy. RADIATION DOSE REDUCTION: This exam was performed according to the departmental dose-optimization program which includes automated exposure control, adjustment of the mA and/or kV according to patient size and/or use of iterative reconstruction technique. CONTRAST:  57mL OMNIPAQUE IOHEXOL 350 MG/ML SOLN COMPARISON:  One-view chest x-ray 09/22/2021.   CTA chest 10/31/2019 FINDINGS: Cardiovascular: Heart size is normal. Aorta and great vessel origins are within normal limits. Pulmonary artery opacification is excellent. No focal filling defects are present to suggest pulmonary embolus. Mediastinum/Nodes: No enlarged mediastinal, hilar, or axillary lymph nodes. Thyroid gland, trachea, and esophagus demonstrate no significant findings. Lungs/Pleura: Extensive bilateral patchy airspace disease is present. This most prominent the left lower lobe. A new cavitary lesion is present a medial left lower lobe measuring 18 x 18 mm. Moderate left pleural effusion is present with associated atelectasis. Previously seen right middle lobe airspace disease has cleared. Cavitary lesion in the right upper lobe is now fluid-filled, overall stable in size. Upper Abdomen: Splenomegaly noted. Visualized upper abdomen is otherwise within normal limits. Musculoskeletal: No chest wall abnormality. No acute or significant osseous findings. Review of the MIP images confirms the above findings. IMPRESSION: 1. No pulmonary embolus. 2. Extensive bilateral patchy airspace disease consistent with multifocal pneumonia. 3. New cavitary lesion in the medial left lower lobe. 4. Moderate left pleural effusion with associated atelectasis. 5. Cavitary lesion in the right upper lobe is now fluid-filled, overall stable in size. 6. Splenomegaly. Electronically Signed   By: Marin Roberts M.D.   On: 09/22/2021 19:56   DG Chest 1 View  Result Date: 09/22/2021 CLINICAL DATA:  Pneumonia EXAM: CHEST  1 VIEW COMPARISON:  09/19/2021 FINDINGS: Cardiac size is within normal limits. Low position of diaphragms may suggest COPD. New patchy infiltrates are noted in the left parahilar region and left lower lung fields. There is no pleural effusion or pneumothorax. IMPRESSION: There is new patchy infiltrate in the left parahilar region and left lower lung fields suggesting pneumonia. Electronically Signed    By: Ernie Avena M.D.   On: 09/22/2021 19:16    2D ECHO:   Disposition and Follow-up:    DISPOSITION: Patient left AMA. He/ she was advised to seek follow-up with primary care physician.     DISCHARGE FOLLOW-UP   Patient left AMA   Signed:   Thad Ranger M.D. [facility] 10/08/2021, 11:49 AM

## 2021-10-10 NOTE — Progress Notes (Unsigned)
   Due to pt leaving hospital AMA and with no current home address (homeless essentially) we are unable to provide hospice care at this time. WE have closed referral out. Please reach back out if this situation changes and services are needed. Thank you Norm Parcel RN [phone]

## 2022-03-20 ENCOUNTER — Emergency Department (HOSPITAL_COMMUNITY)
Admission: EM | Admit: 2022-03-20 | Discharge: 2022-03-21 | Disposition: A | Discharge status: Home / Self Care | Payer: Medicaid Other | Attending: Emergency Medicine | Admitting: Emergency Medicine

## 2022-03-20 ENCOUNTER — Encounter (HOSPITAL_COMMUNITY): Payer: Self-pay | Admitting: Emergency Medicine

## 2022-03-20 ENCOUNTER — Emergency Department (HOSPITAL_COMMUNITY): Payer: Medicaid Other

## 2022-03-20 DIAGNOSIS — R051 Acute cough: Secondary | ICD-10-CM | POA: Insufficient documentation

## 2022-03-20 DIAGNOSIS — R079 Chest pain, unspecified: Secondary | ICD-10-CM | POA: Diagnosis present

## 2022-03-20 LAB — COMPREHENSIVE METABOLIC PANEL
ALT: 70 U/L — ABNORMAL HIGH (ref 0–44)
AST: 65 U/L — ABNORMAL HIGH (ref 15–41)
Albumin: 3.2 g/dL — ABNORMAL LOW (ref 3.5–5.0)
Alkaline Phosphatase: 119 U/L (ref 38–126)
Anion gap: 11 (ref 5–15)
BUN: 20 mg/dL (ref 6–20)
CO2: 18 mmol/L — ABNORMAL LOW (ref 22–32)
Calcium: 8.9 mg/dL (ref 8.9–10.3)
Chloride: 105 mmol/L (ref 98–111)
Creatinine, Ser: 0.83 mg/dL (ref 0.61–1.24)
GFR, Estimated: >60 mL/min (ref >60)
Glucose, Bld: 484 mg/dL — ABNORMAL HIGH (ref 70–99)
Potassium: 3.9 mmol/L (ref 3.5–5.1)
Sodium: 134 mmol/L — ABNORMAL LOW (ref 135–145)
Total Bilirubin: 0.5 mg/dL (ref 0.3–1.2)
Total Protein: 8.2 g/dL — ABNORMAL HIGH (ref 6.5–8.1)

## 2022-03-20 LAB — URINALYSIS, ROUTINE W REFLEX MICROSCOPIC
Bacteria, UA: NONE SEEN
Bilirubin Urine: NEGATIVE
Glucose, UA: >=500 mg/dL — AB
Ketones, ur: 5 mg/dL — AB
Leukocytes,Ua: NEGATIVE
Nitrite: NEGATIVE
Protein, ur: NEGATIVE mg/dL
Specific Gravity, Urine: 1.031 — ABNORMAL HIGH (ref 1.005–1.030)
pH: 5 (ref 5.0–8.0)

## 2022-03-20 LAB — CBC WITH DIFFERENTIAL/PLATELET
Abs Immature Granulocytes: 0.04 10*3/uL (ref 0.00–0.07)
Basophils Absolute: 0.1 10*3/uL (ref 0.0–0.1)
Basophils Relative: 1 %
Eosinophils Absolute: 0.1 10*3/uL (ref 0.0–0.5)
Eosinophils Relative: 1 %
HCT: 39.1 % (ref 39.0–52.0)
Hemoglobin: 13.4 g/dL (ref 13.0–17.0)
Immature Granulocytes: 1 %
Lymphocytes Relative: 30 %
Lymphs Abs: 2 10*3/uL (ref 0.7–4.0)
MCH: 31.2 pg (ref 26.0–34.0)
MCHC: 34.3 g/dL (ref 30.0–36.0)
MCV: 90.9 fL (ref 80.0–100.0)
Monocytes Absolute: 0.3 10*3/uL (ref 0.1–1.0)
Monocytes Relative: 5 %
Neutro Abs: 4.1 10*3/uL (ref 1.7–7.7)
Neutrophils Relative %: 62 %
Platelets: 143 10*3/uL — ABNORMAL LOW (ref 150–400)
RBC: 4.3 MIL/uL (ref 4.22–5.81)
RDW: 12.6 % (ref 11.5–15.5)
WBC: 6.6 10*3/uL (ref 4.0–10.5)
nRBC: 0 % (ref 0.0–0.2)

## 2022-03-20 LAB — TROPONIN I (HIGH SENSITIVITY)
Troponin I (High Sensitivity): <2 ng/L (ref <18)
Troponin I (High Sensitivity): 2 ng/L (ref <18)

## 2022-03-20 LAB — CBG MONITORING, ED: Glucose-Capillary: 438 mg/dL — ABNORMAL HIGH (ref 70–99)

## 2022-03-20 LAB — LIPASE, BLOOD: Lipase: 38 U/L (ref 11–51)

## 2022-03-20 NOTE — ED Provider Triage Note (Signed)
Emergency Medicine Provider Triage Evaluation Note  Antonio [name] , a 51 y.o. male  was evaluated in triage.  Pt complains of generalized abdominal pain, chest pain, and ulcers to his lower extremities.  Brought by [facility] who states, per family, patient cries, drinks, and does meth all day.  He is noncompliant with medications.  Continues to ask for food and water while in triage.    Review of Systems  Positive: See above Negative: See above  Physical Exam  BP 114/88   Pulse 99   Temp (!) 97.3 F (36.3 C) (Oral)   Resp 20   SpO2 100%  Gen:   Awake, no distress   Resp:  Normal effort  MSK:   Moves extremities without difficulty  Other:  Abdomen generally tender; patient is moaning/crying; large ulcers to bilateral feet/ankles  Medical Decision Making  Medically screening exam initiated at 6:28 PM.  Appropriate orders placed.  Antonio [name] was informed that the remainder of the evaluation will be completed by another provider, this initial triage assessment does not replace that evaluation, and the importance of remaining in the ED until their evaluation is complete.     Melton Alar R, [place] 03/20/22 [phone]

## 2022-03-20 NOTE — ED Triage Notes (Signed)
Pt arrives via [facility] with reports of generalized abd pain. Per family, pt cries, drinks and does meth all day.

## 2022-03-21 LAB — CBG MONITORING, ED: Glucose-Capillary: 281 mg/dL — ABNORMAL HIGH (ref 70–99)

## 2022-03-21 MED ORDER — INSULIN ASPART 100 UNIT/ML IJ SOLN
15.0000 [IU] | Freq: Once | INTRAMUSCULAR | Status: AC
  Administered 2022-03-21: 15 [IU] via INTRAVENOUS

## 2022-03-21 MED ORDER — DOXYCYCLINE HYCLATE 100 MG PO CAPS: 100.0000 mg | ORAL_CAPSULE | Freq: Two times a day (BID) | ORAL | 0 refills | Status: DC

## 2022-03-21 MED ORDER — SODIUM CHLORIDE 0.9 % IV BOLUS
1000.0000 mL | Freq: Once | INTRAVENOUS | Status: AC
  Administered 2022-03-21: 1000 mL via INTRAVENOUS

## 2022-03-21 MED ORDER — DOXYCYCLINE HYCLATE 100 MG PO TABS
100.0000 mg | ORAL_TABLET | Freq: Once | ORAL | Status: AC
  Administered 2022-03-21: 100 mg via ORAL
  Filled 2022-03-21: qty 1

## 2022-03-21 NOTE — ED Provider Notes (Signed)
[facility] EMERGENCY DEPARTMENT Provider Note   CSN: [id number] Arrival date & time: 03/20/22  1630     History  Chief Complaint  Patient presents with   Abdominal Pain    Antonio [name] is a 51 y.o. male.  51 yo M with a chief plaints of cough chest pain with coughing.  Fatigue.  This been going on for couple weeks.  He has also been having pain to sores on his legs after being incarcerated.  He is hungry.  Denies abdominal pain.  Denies fevers.  No known sick contacts.   Abdominal Pain      Home Medications Prior to Admission medications   Medication Sig Start Date End Date Taking? Authorizing Provider  doxycycline (VIBRAMYCIN) 100 MG capsule Take 1 capsule (100 mg total) by mouth 2 (two) times daily. One po bid x 7 days 03/21/22  Yes [name], DO  gabapentin (NEURONTIN) 400 MG capsule Take 1 capsule (400 mg total) by mouth 3 (three) times daily. Patient not taking: Reported on 09/22/2021 09/17/20   [name], NP  metFORMIN (GLUCOPHAGE-XR) 750 MG 24 hr tablet Take 1 tablet (750 mg total) by mouth 2 (two) times daily with a meal. Patient not taking: Reported on 09/22/2021 09/17/20   [name], NP  sertraline (ZOLOFT) 100 MG tablet Take 1 tablet (100 mg total) by mouth daily. Patient not taking: Reported on 09/22/2021 09/18/20   [name], NP  sulfamethoxazole-trimethoprim (BACTRIM DS) 800-160 MG tablet Take 1 tablet by mouth every 12 (twelve) hours. Patient not taking: Reported on 09/22/2021 09/17/20   [name], NP      Allergies    Acetaminophen    Review of Systems   Review of Systems  Gastrointestinal:  Positive for abdominal pain.    Physical Exam Updated Vital Signs BP 125/85 (BP Location: Right Arm)   Pulse 75   Temp 97.8 F (36.6 C) (Oral)   Resp 18   SpO2 98%  Physical Exam Vitals and nursing note reviewed.  Constitutional:      Appearance: He is well-developed.     Comments: Cachectic   HENT:     Head: Normocephalic and atraumatic.  Eyes:     Pupils: Pupils are equal, round, and reactive to light.  Neck:     Vascular: No JVD.  Cardiovascular:     Rate and Rhythm: Normal rate and regular rhythm.     Heart sounds: No murmur heard.    No friction rub. No gallop.  Pulmonary:     Effort: No respiratory distress.     Breath sounds: No wheezing.  Abdominal:     General: There is no distension.     Tenderness: There is no abdominal tenderness. There is no guarding or rebound.  Musculoskeletal:        General: Normal range of motion.     Cervical back: Normal range of motion and neck supple.  Skin:    Coloration: Skin is not pale.     Findings: No rash.     Comments: Old appearing ulcers to the dorsum of the left foot's and about the medial malleolus.  Foot is warm to touch.  Intact pulse motor and sensation.  Neurological:     Mental Status: He is alert and oriented to person, place, and time.  Psychiatric:        Behavior: Behavior normal.     ED Results / Procedures / Treatments   Labs (all labs ordered  are listed, but only abnormal results are displayed) Labs Reviewed  COMPREHENSIVE METABOLIC PANEL - Abnormal; Notable for the following components:      Result Value   Sodium 134 (*)    CO2 18 (*)    Glucose, Bld 484 (*)    Total Protein 8.2 (*)    Albumin 3.2 (*)    AST 65 (*)    ALT 70 (*)    All other components within normal limits  URINALYSIS, ROUTINE W REFLEX MICROSCOPIC - Abnormal; Notable for the following components:   Specific Gravity, Urine 1.031 (*)    Glucose, UA >=500 (*)    Hgb urine dipstick SMALL (*)    Ketones, ur 5 (*)    All other components within normal limits  CBC WITH DIFFERENTIAL/PLATELET - Abnormal; Notable for the following components:   Platelets 143 (*)    All other components within normal limits  CBG MONITORING, ED - Abnormal; Notable for the following components:   Glucose-Capillary 438 (*)    All other components  within normal limits  CBG MONITORING, ED - Abnormal; Notable for the following components:   Glucose-Capillary 281 (*)    All other components within normal limits  LIPASE, BLOOD  TROPONIN I (HIGH SENSITIVITY)  TROPONIN I (HIGH SENSITIVITY)    EKG EKG Interpretation  Date/Time:  Wednesday March 20 2022 18:50:39 EST Ventricular Rate:  105 PR Interval:  182 QRS Duration: 80 QT Interval:  360 QTC Calculation: 475 R Axis:   102 Text Interpretation:  Critical Test Result: STEMI Sinus tachycardia Rightward axis ST elevation consider lateral injury or acute infarct Abnormal ECG When compared with ECG of 23-Sep-2021 06:17, PREVIOUS ECG IS PRESENT Confirmed by Blanchie Dessert [phone]) on 03/20/2022 6:53:37 PM  Radiology DG Chest 2 View  Result Date: 03/20/2022 CLINICAL DATA:  Chest pain EXAM: CHEST - 2 VIEW COMPARISON:  12/21/2011 FINDINGS: The heart size and mediastinal contours are within normal limits. Both lungs are clear. Old posterior right eighth rib fracture. Osseous structures are otherwise intact. IMPRESSION: No active cardiopulmonary disease. Electronically Signed   By: Sammie Bench M.D.   On: 03/20/2022 19:26    Procedures Procedures    Medications Ordered in ED Medications  sodium chloride 0.9 % bolus 1,000 mL (0 mLs Intravenous Stopped 03/21/22 0331)  insulin aspart (novoLOG) injection 15 Units (15 Units Intravenous Given 03/21/22 0149)  doxycycline (VIBRA-TABS) tablet 100 mg (100 mg Oral Given 03/21/22 0144)    ED Course/ Medical Decision Making/ A&P                           Medical Decision Making Risk Prescription drug management.   51 yo M with a chief complaint of cough pain with coughing.  This been going on for couple weeks.  Patient looks very dehydrated on initial exam.  Hyperglycemic here.  Has a metabolic acidosis without anion gap.  Could be due to the ketosis.  Will give a bolus of IV fluids.  Chest x-ray independently interpreted by me without  focal infiltrate.  Patient appears very chronically ill.  Will cover him with antibiotics for possible underlying pneumonia that is not visualized with his severe dehydration.  He is also significantly hyperglycemic here.  Give a bolus dose of insulin.  Reassess.  Blood sugar improved on repeat.  D/c home. PCP follow up.  3:43 AM:  I have discussed the diagnosis/risks/treatment options with the patient.  Evaluation and diagnostic testing in the  emergency department does not suggest an emergent condition requiring admission or immediate intervention beyond what has been performed at this time.  They will follow up with PCP. We also discussed returning to the ED immediately if new or worsening sx occur. We discussed the sx which are most concerning (e.g., sudden worsening pain, fever, inability to tolerate by mouth) that necessitate immediate return. Medications administered to the patient during their visit and any new prescriptions provided to the patient are listed below.  Medications given during this visit Medications  sodium chloride 0.9 % bolus 1,000 mL (0 mLs Intravenous Stopped 03/21/22 0331)  insulin aspart (novoLOG) injection 15 Units (15 Units Intravenous Given 03/21/22 0149)  doxycycline (VIBRA-TABS) tablet 100 mg (100 mg Oral Given 03/21/22 0144)     The patient appears reasonably screen and/or stabilized for discharge and I doubt any other medical condition or other [facility] requiring further screening, evaluation, or treatment in the ED at this time prior to discharge.          Final Clinical Impression(s) / ED Diagnoses Final diagnoses:  Acute cough    Rx / DC Orders ED Discharge Orders          Ordered    doxycycline (VIBRAMYCIN) 100 MG capsule  2 times daily        03/21/22 0303              Deno Etienne, DO 03/21/22 [phone]

## 2022-03-21 NOTE — ED Notes (Signed)
Pt discharged to lobby with no difficulty learning discharge teaching.

## 2022-03-21 NOTE — Discharge Instructions (Signed)
Follow up with your doctor in the office.  Return for worsening symptoms.   I would apply an ointment to your wounds twice a day.  This could be Vaseline or it could be an antibiotic ointment if you so choose.  Please return for red spreading redness or if you develop fever.

## 2022-03-23 DIAGNOSIS — F1410 Cocaine abuse, uncomplicated: Secondary | ICD-10-CM | POA: Insufficient documentation

## 2022-04-10 DIAGNOSIS — J101 Influenza due to other identified influenza virus with other respiratory manifestations: Secondary | ICD-10-CM | POA: Insufficient documentation

## 2022-04-10 DIAGNOSIS — F1010 Alcohol abuse, uncomplicated: Secondary | ICD-10-CM | POA: Insufficient documentation

## 2022-10-17 DIAGNOSIS — L97322 Non-pressure chronic ulcer of left ankle with fat layer exposed: Secondary | ICD-10-CM | POA: Insufficient documentation

## 2022-10-19 ENCOUNTER — Encounter (HOSPITAL_COMMUNITY): Payer: Self-pay

## 2022-10-19 ENCOUNTER — Emergency Department (HOSPITAL_COMMUNITY)
Admission: EM | Admit: 2022-10-19 | Discharge: 2022-10-19 | Disposition: A | Discharge status: Home / Self Care | Payer: MEDICAID | Attending: Emergency Medicine | Admitting: Emergency Medicine

## 2022-10-19 ENCOUNTER — Emergency Department (HOSPITAL_COMMUNITY): Payer: MEDICAID

## 2022-10-19 ENCOUNTER — Emergency Department (EMERGENCY_DEPARTMENT_HOSPITAL)
Admission: EM | Admit: 2022-10-19 | Discharge: 2022-10-26 | Disposition: A | Discharge status: Home / Self Care | Payer: MEDICAID | Source: Home / Self Care | Attending: Emergency Medicine | Admitting: Emergency Medicine

## 2022-10-19 ENCOUNTER — Other Ambulatory Visit: Payer: Self-pay

## 2022-10-19 DIAGNOSIS — M791 Myalgia, unspecified site: Secondary | ICD-10-CM | POA: Insufficient documentation

## 2022-10-19 DIAGNOSIS — E1165 Type 2 diabetes mellitus with hyperglycemia: Secondary | ICD-10-CM | POA: Diagnosis present

## 2022-10-19 DIAGNOSIS — Z5900 Homelessness unspecified: Secondary | ICD-10-CM | POA: Diagnosis not present

## 2022-10-19 DIAGNOSIS — R45851 Suicidal ideations: Secondary | ICD-10-CM | POA: Insufficient documentation

## 2022-10-19 DIAGNOSIS — D72819 Decreased white blood cell count, unspecified: Secondary | ICD-10-CM | POA: Diagnosis not present

## 2022-10-19 DIAGNOSIS — F332 Major depressive disorder, recurrent severe without psychotic features: Secondary | ICD-10-CM | POA: Insufficient documentation

## 2022-10-19 DIAGNOSIS — F331 Major depressive disorder, recurrent, moderate: Secondary | ICD-10-CM | POA: Diagnosis present

## 2022-10-19 DIAGNOSIS — Z560 Unemployment, unspecified: Secondary | ICD-10-CM | POA: Diagnosis not present

## 2022-10-19 DIAGNOSIS — R739 Hyperglycemia, unspecified: Secondary | ICD-10-CM

## 2022-10-19 DIAGNOSIS — E119 Type 2 diabetes mellitus without complications: Secondary | ICD-10-CM | POA: Insufficient documentation

## 2022-10-19 DIAGNOSIS — D696 Thrombocytopenia, unspecified: Secondary | ICD-10-CM | POA: Insufficient documentation

## 2022-10-19 DIAGNOSIS — R55 Syncope and collapse: Secondary | ICD-10-CM | POA: Insufficient documentation

## 2022-10-19 DIAGNOSIS — Z794 Long term (current) use of insulin: Secondary | ICD-10-CM | POA: Insufficient documentation

## 2022-10-19 DIAGNOSIS — Z1152 Encounter for screening for COVID-19: Secondary | ICD-10-CM | POA: Diagnosis not present

## 2022-10-19 DIAGNOSIS — Z5941 Food insecurity: Secondary | ICD-10-CM | POA: Diagnosis not present

## 2022-10-19 DIAGNOSIS — D649 Anemia, unspecified: Secondary | ICD-10-CM | POA: Insufficient documentation

## 2022-10-19 DIAGNOSIS — Z7984 Long term (current) use of oral hypoglycemic drugs: Secondary | ICD-10-CM | POA: Insufficient documentation

## 2022-10-19 DIAGNOSIS — Z91148 Patient's other noncompliance with medication regimen for other reason: Secondary | ICD-10-CM | POA: Insufficient documentation

## 2022-10-19 DIAGNOSIS — F322 Major depressive disorder, single episode, severe without psychotic features: Secondary | ICD-10-CM | POA: Diagnosis not present

## 2022-10-19 DIAGNOSIS — E871 Hypo-osmolality and hyponatremia: Secondary | ICD-10-CM

## 2022-10-19 LAB — BLOOD GAS, VENOUS
Acid-Base Excess: 5 mmol/L — ABNORMAL HIGH (ref 0.0–2.0)
Bicarbonate: 31 mmol/L — ABNORMAL HIGH (ref 20.0–28.0)
O2 Saturation: 78.4 %
Patient temperature: 37
pCO2, Ven: 50 mmHg (ref 44–60)
pH, Ven: 7.4 (ref 7.25–7.43)
pO2, Ven: 41 mmHg (ref 32–45)

## 2022-10-19 LAB — LACTIC ACID, PLASMA: Lactic Acid, Venous: 1.8 mmol/L (ref 0.5–1.9)

## 2022-10-19 LAB — COMPREHENSIVE METABOLIC PANEL
ALT: 34 U/L (ref 0–44)
ALT: 34 U/L (ref 0–44)
AST: 57 U/L — ABNORMAL HIGH (ref 15–41)
AST: 41 U/L (ref 15–41)
Albumin: 3.3 g/dL — ABNORMAL LOW (ref 3.5–5.0)
Albumin: 3.2 g/dL — ABNORMAL LOW (ref 3.5–5.0)
Alkaline Phosphatase: 119 U/L (ref 38–126)
Alkaline Phosphatase: 124 U/L (ref 38–126)
Anion gap: 5 (ref 5–15)
Anion gap: 6 (ref 5–15)
BUN: 17 mg/dL (ref 6–20)
BUN: 19 mg/dL (ref 6–20)
CO2: 24 mmol/L (ref 22–32)
CO2: 25 mmol/L (ref 22–32)
Calcium: 8 mg/dL — ABNORMAL LOW (ref 8.9–10.3)
Calcium: 8.5 mg/dL — ABNORMAL LOW (ref 8.9–10.3)
Chloride: 102 mmol/L (ref 98–111)
Chloride: 105 mmol/L (ref 98–111)
Creatinine, Ser: 0.69 mg/dL (ref 0.61–1.24)
Creatinine, Ser: 0.73 mg/dL (ref 0.61–1.24)
GFR, Estimated: >60 mL/min (ref >60)
GFR, Estimated: >60 mL/min (ref >60)
Glucose, Bld: 410 mg/dL — ABNORMAL HIGH (ref 70–99)
Glucose, Bld: 147 mg/dL — ABNORMAL HIGH (ref 70–99)
Potassium: 5 mmol/L (ref 3.5–5.1)
Potassium: 3.9 mmol/L (ref 3.5–5.1)
Sodium: 131 mmol/L — ABNORMAL LOW (ref 135–145)
Sodium: 136 mmol/L (ref 135–145)
Total Bilirubin: 0.8 mg/dL (ref 0.3–1.2)
Total Bilirubin: 0.4 mg/dL (ref 0.3–1.2)
Total Protein: 6.8 g/dL (ref 6.5–8.1)
Total Protein: 6.4 g/dL — ABNORMAL LOW (ref 6.5–8.1)

## 2022-10-19 LAB — CBC WITH DIFFERENTIAL/PLATELET
Abs Immature Granulocytes: 0.02 10*3/uL (ref 0.00–0.07)
Basophils Absolute: 0.1 10*3/uL (ref 0.0–0.1)
Basophils Relative: 1 %
Eosinophils Absolute: 0.1 10*3/uL (ref 0.0–0.5)
Eosinophils Relative: 2 %
HCT: 32.9 % — ABNORMAL LOW (ref 39.0–52.0)
Hemoglobin: 11 g/dL — ABNORMAL LOW (ref 13.0–17.0)
Immature Granulocytes: 0 %
Lymphocytes Relative: 32 %
Lymphs Abs: 1.4 10*3/uL (ref 0.7–4.0)
MCH: 31.1 pg (ref 26.0–34.0)
MCHC: 33.4 g/dL (ref 30.0–36.0)
MCV: 92.9 fL (ref 80.0–100.0)
Monocytes Absolute: 0.3 10*3/uL (ref 0.1–1.0)
Monocytes Relative: 7 %
Neutro Abs: 2.6 10*3/uL (ref 1.7–7.7)
Neutrophils Relative %: 58 %
Platelets: 100 10*3/uL — ABNORMAL LOW (ref 150–400)
RBC: 3.54 MIL/uL — ABNORMAL LOW (ref 4.22–5.81)
RDW: 15 % (ref 11.5–15.5)
WBC: 4.5 10*3/uL (ref 4.0–10.5)
nRBC: 0 % (ref 0.0–0.2)

## 2022-10-19 LAB — SALICYLATE LEVEL: Salicylate Lvl: <7 mg/dL — ABNORMAL LOW (ref 7.0–30.0)

## 2022-10-19 LAB — I-STAT CHEM 8, ED
BUN: 17 mg/dL (ref 6–20)
Calcium, Ion: 1.22 mmol/L (ref 1.15–1.40)
Chloride: 105 mmol/L (ref 98–111)
Creatinine, Ser: 0.7 mg/dL (ref 0.61–1.24)
Glucose, Bld: 142 mg/dL — ABNORMAL HIGH (ref 70–99)
HCT: 33 % — ABNORMAL LOW (ref 39.0–52.0)
Hemoglobin: 11.2 g/dL — ABNORMAL LOW (ref 13.0–17.0)
Potassium: 4.1 mmol/L (ref 3.5–5.1)
Sodium: 141 mmol/L (ref 135–145)
TCO2: 25 mmol/L (ref 22–32)

## 2022-10-19 LAB — URINALYSIS, ROUTINE W REFLEX MICROSCOPIC
Bacteria, UA: NONE SEEN
Bilirubin Urine: NEGATIVE
Glucose, UA: >=500 mg/dL — AB
Hgb urine dipstick: NEGATIVE
Ketones, ur: NEGATIVE mg/dL
Leukocytes,Ua: NEGATIVE
Nitrite: NEGATIVE
Protein, ur: NEGATIVE mg/dL
Specific Gravity, Urine: 1.025 (ref 1.005–1.030)
pH: 5 (ref 5.0–8.0)

## 2022-10-19 LAB — CBC
HCT: 35.5 % — ABNORMAL LOW (ref 39.0–52.0)
Hemoglobin: 11.8 g/dL — ABNORMAL LOW (ref 13.0–17.0)
MCH: 30.8 pg (ref 26.0–34.0)
MCHC: 33.2 g/dL (ref 30.0–36.0)
MCV: 92.7 fL (ref 80.0–100.0)
Platelets: 102 10*3/uL — ABNORMAL LOW (ref 150–400)
RBC: 3.83 MIL/uL — ABNORMAL LOW (ref 4.22–5.81)
RDW: 15.1 % (ref 11.5–15.5)
WBC: 3.5 10*3/uL — ABNORMAL LOW (ref 4.0–10.5)
nRBC: 0 % (ref 0.0–0.2)

## 2022-10-19 LAB — TROPONIN I (HIGH SENSITIVITY): Troponin I (High Sensitivity): <2 ng/L (ref <18)

## 2022-10-19 LAB — CBG MONITORING, ED
Glucose-Capillary: 416 mg/dL — ABNORMAL HIGH (ref 70–99)
Glucose-Capillary: 271 mg/dL — ABNORMAL HIGH (ref 70–99)

## 2022-10-19 LAB — ETHANOL: Alcohol, Ethyl (B): <10 mg/dL (ref <10)

## 2022-10-19 LAB — ACETAMINOPHEN LEVEL: Acetaminophen (Tylenol), Serum: <10 ug/mL — ABNORMAL LOW (ref 10–30)

## 2022-10-19 LAB — SARS CORONAVIRUS 2 BY RT PCR: SARS Coronavirus 2 by RT PCR: NEGATIVE

## 2022-10-19 LAB — CK: Total CK: 36 U/L — ABNORMAL LOW (ref 49–397)

## 2022-10-19 MED ORDER — GABAPENTIN 400 MG PO CAPS: 400.0000 mg | ORAL_CAPSULE | Freq: Three times a day (TID) | ORAL | Status: DC

## 2022-10-19 MED ORDER — KETOROLAC TROMETHAMINE 15 MG/ML IJ SOLN
15.0000 mg | Freq: Once | INTRAMUSCULAR | Status: AC
  Administered 2022-10-19: 15 mg via INTRAVENOUS
  Filled 2022-10-19: qty 1

## 2022-10-19 MED ORDER — TRAZODONE HCL 50 MG PO TABS
50.0000 mg | ORAL_TABLET | Freq: Every day | ORAL | Status: DC
  Administered 2022-10-19 – 2022-10-25 (×6): 50 mg via ORAL
  Filled 2022-10-19 (×6): qty 1

## 2022-10-19 MED ORDER — SODIUM CHLORIDE 0.9 % IV BOLUS
1000.0000 mL | Freq: Once | INTRAVENOUS | Status: AC
  Administered 2022-10-19: 1000 mL via INTRAVENOUS

## 2022-10-19 MED ORDER — LACTATED RINGERS IV BOLUS: 500.0000 mL | Freq: Once | INTRAVENOUS | Status: DC

## 2022-10-19 MED ORDER — GABAPENTIN 300 MG PO CAPS
600.0000 mg | ORAL_CAPSULE | Freq: Three times a day (TID) | ORAL | Status: DC
  Administered 2022-10-19 – 2022-10-22 (×8): 600 mg via ORAL
  Filled 2022-10-19 (×8): qty 2

## 2022-10-19 MED ORDER — METFORMIN HCL ER 750 MG PO TB24
750.0000 mg | ORAL_TABLET | Freq: Two times a day (BID) | ORAL | Status: DC
  Administered 2022-10-20 – 2022-10-26 (×14): 750 mg via ORAL
  Filled 2022-10-19 (×16): qty 1

## 2022-10-19 MED ORDER — INSULIN ASPART 100 UNIT/ML IJ SOLN
10.0000 [IU] | Freq: Once | INTRAMUSCULAR | Status: AC
  Administered 2022-10-19: 10 [IU] via SUBCUTANEOUS
  Filled 2022-10-19: qty 0.1

## 2022-10-19 MED ORDER — METFORMIN HCL ER 750 MG PO TB24: 750.0000 mg | ORAL_TABLET | Freq: Two times a day (BID) | ORAL | 0 refills | Status: DC

## 2022-10-19 MED ORDER — ARIPIPRAZOLE 10 MG PO TABS
10.0000 mg | ORAL_TABLET | Freq: Every day | ORAL | Status: DC
  Administered 2022-10-19 – 2022-10-22 (×4): 10 mg via ORAL
  Filled 2022-10-19 (×4): qty 1

## 2022-10-19 MED ORDER — METFORMIN HCL ER 750 MG PO TB24: 750.0000 mg | ORAL_TABLET | Freq: Two times a day (BID) | ORAL | 0 refills | Status: AC

## 2022-10-19 MED ORDER — SERTRALINE HCL 50 MG PO TABS
100.0000 mg | ORAL_TABLET | Freq: Every day | ORAL | Status: DC
  Administered 2022-10-19 – 2022-10-26 (×8): 100 mg via ORAL
  Filled 2022-10-19 (×8): qty 2

## 2022-10-19 MED ORDER — IBUPROFEN 800 MG PO TABS
800.0000 mg | ORAL_TABLET | Freq: Four times a day (QID) | ORAL | Status: DC | PRN
  Administered 2022-10-19 – 2022-10-22 (×2): 800 mg via ORAL
  Filled 2022-10-19 (×2): qty 1

## 2022-10-19 NOTE — ED Triage Notes (Addendum)
Patient BIB EMS for hyperglycemia. BGL 450 for EMS Patient is non compliant with diabetes medications. Patient refusing to answer questions.   BGL on arrival 416.

## 2022-10-19 NOTE — ED Provider Notes (Signed)
[place] EMERGENCY DEPARTMENT AT [facility] Provider Note   CSN: [id number] Arrival date & time: 10/19/22  1811     History  Chief Complaint  Patient presents with   Loss of Consciousness    Antonio [name] is a 52 y.o. male history of known IV drug use, alcohol hepatitis, diabetes here presenting with weakness.  Patient was seen here earlier for weakness.  Patient was noted to be hyperglycemic and was given IV fluids and glucose went down from 400 down to 271.  Patient was prescribed metformin.  Patient was at the bus stop and apparently had a near syncopal event.  Bystanders witnessed it and his blood pressure was down to 90/70.  Patient states that he is suicidal and plans to kill himself if he gets out.  He is also homeless.  The history is provided by the patient.       Home Medications Prior to Admission medications   Medication Sig Start Date End Date Taking? Authorizing Provider  ARIPiprazole (ABILIFY) 10 MG tablet Take 10 mg by mouth daily.    [provider]  doxycycline (VIBRAMYCIN) 100 MG capsule Take 1 capsule (100 mg total) by mouth 2 (two) times daily. One po bid x 7 days 03/21/22   [name], DO  gabapentin (NEURONTIN) 400 MG capsule Take 1 capsule (400 mg total) by mouth 3 (three) times daily. Patient not taking: Reported on 10/19/2022 09/17/20   [name], NP  gabapentin (NEURONTIN) 600 MG tablet Take 600 mg by mouth 4 (four) times daily.    [provider]  HUMALOG KWIKPEN 100 UNIT/ML KwikPen Inject 2-8 Units into the skin See admin instructions. Inject 2-8 units into the skin four times a day, per sliding scale: BGL 140-180 = 2 units; 181-240 = 4 units; 241-300 = 6 units; 301-350 = 8 units    [provider]  LANTUS SOLOSTAR 100 UNIT/ML Solostar Pen Inject 12 Units into the skin 2 (two) times daily.    [provider]  metFORMIN (GLUCOPHAGE-XR) 750 MG 24 hr tablet Take 1 tablet (750 mg total) by mouth 2  (two) times daily with a meal. 10/19/22   Harriet Pho, Barnetta Chapel, PA-C  sertraline (ZOLOFT) 100 MG tablet Take 1 tablet (100 mg total) by mouth daily. 09/18/20   [name], NP  sulfamethoxazole-trimethoprim (BACTRIM DS) 800-160 MG tablet Take 1 tablet by mouth every 12 (twelve) hours. 09/17/20   [name], NP  traZODone (DESYREL) 50 MG tablet Take 50 mg by mouth at bedtime.    [provider]      Allergies    Acetaminophen    Review of Systems   Review of Systems  Neurological:  Positive for weakness.  Psychiatric/Behavioral:  Positive for suicidal ideas.   All other systems reviewed and are negative.   Physical Exam Updated Vital Signs BP 113/89 (BP Location: Right Arm)   Pulse 88   Temp 98.1 F (36.7 C) (Oral)   Resp 13   Ht 5\' 9"  (1.753 m)   Wt 43.6 kg   SpO2 99%   BMI 14.19 kg/m  Physical Exam Vitals and nursing note reviewed.  Constitutional:      Comments: Chronically ill and depressed  HENT:     Head: Normocephalic.     Nose: Nose normal.     Mouth/Throat:     Mouth: Mucous membranes are dry.  Eyes:     Extraocular Movements: Extraocular movements intact.  Cardiovascular:  Rate and Rhythm: Normal rate and regular rhythm.     Pulses: Normal pulses.     Heart sounds: Normal heart sounds.  Pulmonary:     Effort: Pulmonary effort is normal.     Breath sounds: Normal breath sounds.  Abdominal:     General: Abdomen is flat.     Palpations: Abdomen is soft.  Musculoskeletal:        General: Normal range of motion.     Cervical back: Normal range of motion and neck supple.  Skin:    General: Skin is warm.     Capillary Refill: Capillary refill takes less than 2 seconds.  Neurological:     General: No focal deficit present.  Psychiatric:     Comments: Depressed and suicidal     ED Results / Procedures / Treatments   Labs (all labs ordered are listed, but only abnormal results are displayed) Labs Reviewed  I-STAT CHEM 8, ED -  Abnormal; Notable for the following components:      Result Value   Glucose, Bld 142 (*)    Hemoglobin 11.2 (*)    HCT 33.0 (*)    All other components within normal limits  CBC WITH DIFFERENTIAL/PLATELET  COMPREHENSIVE METABOLIC PANEL  BLOOD GAS, VENOUS  ETHANOL  SALICYLATE LEVEL  ACETAMINOPHEN LEVEL  RAPID URINE DRUG SCREEN, HOSP PERFORMED  CK  TROPONIN I (HIGH SENSITIVITY)    EKG EKG Interpretation Date/Time:  Saturday October 19 2022 18:19:54 EDT Ventricular Rate:  92 PR Interval:  130 QRS Duration:  80 QT Interval:  380 QTC Calculation: 471 R Axis:   85  Text Interpretation: Sinus rhythm Nonspecific T abnrm, anterolateral leads No significant change since last tracing Confirmed by Richardean Canal [phone]) on 10/19/2022 6:37:12 PM  Radiology DG Foot 2 Views Left  Result Date: 10/19/2022 CLINICAL DATA:  Diabetic foot wound. EXAM: LEFT FOOT - 2 VIEW COMPARISON:  None Available. FINDINGS: There is no evidence of fracture or dislocation. No evidence of osteolysis or periostitis. Generalized osteopenia noted. Subtalar joint fusion noted, with surgical pin in medial malleolus of the ankle. Soft tissues are unremarkable. IMPRESSION: No acute findings.  No radiographic evidence of osteomyelitis. Electronically Signed   By: Danae Orleans M.D.   On: 10/19/2022 14:29    Procedures Procedures    Angiocath insertion Performed by: Richardean Canal  Consent: Verbal consent obtained. Risks and benefits: risks, benefits and alternatives were discussed Time out: Immediately prior to procedure a "time out" was called to verify the correct patient, procedure, equipment, support staff and site/side marked as required.  Preparation: Patient was prepped and draped in the usual sterile fashion.  Vein Location: R antecube  Ultrasound Guided  Gauge: 20 long   Normal blood return and flush without difficulty Patient tolerance: Patient tolerated the procedure well with no immediate  complications.    Medications Ordered in ED Medications  sodium chloride 0.9 % bolus 1,000 mL (has no administration in time range)    ED Course/ Medical Decision Making/ A&P                             Medical Decision Making Antonio [name] is a 52 y.o. male here with depression and near syncope.  Patient was seen here earlier for hyperglycemia.  Patient was given IV fluids but had a near syncopal event at the bus stop.  Patient states that she is depressed and wants to kill himself.  He states that if he gets discharged he will kill himself in front of traffic.  At this point we will get psych clearance labs and consult TTS  7:31 PM Reviewed patient's labs and his glucose is down to 147.  Tox level negative.  Patient is still suicidal.  Will consult TTS  7:46 PM Labs unremarkable.  Patient is medically cleared for psych eval.    Amount and/or Complexity of Data Reviewed Labs: ordered. Radiology: ordered. ECG/medicine tests: ordered.   Final Clinical Impression(s) / ED Diagnoses Final diagnoses:  None    Rx / DC Orders ED Discharge Orders     None         Charlynne Pander, MD 10/19/22 1946
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
## 2022-10-19 NOTE — ED Notes (Signed)
Pt made aware of UA sample. Pt unable to void att. Urinal at bedside

## 2022-10-19 NOTE — ED Triage Notes (Addendum)
Antonio [name]. Antonio was discharged at 3. Antonio was given bus pass and taken to bus stop. [name] found Antonio unresponsive at bus stop. Antonio's BP was 90s/70s. Antonio A&Ox4 at time of arrival. VSS and NAD.  BGL 164 with [name]

## 2022-10-19 NOTE — Discharge Instructions (Addendum)
Your workup today was overall reassuring.  Please continue to clean your wound.  Please follow-up with the wound care clinic, I provided their phone number, please call and schedule an appointment or follow-up with your primary care doctor.  Your blood counts today were also a little bit lower than normal, please follow-up with your primary care doctor to have this rechecked.  Please pick up your metformin from [facility] and take this. It is important you take your diabetes medicine.  Please return to the ER for any new or worsening symptoms.

## 2022-10-19 NOTE — ED Provider Notes (Signed)
[place] EMERGENCY DEPARTMENT AT [facility] Provider Note   CSN: [id number] Arrival date & time: 10/19/22  1228     History  Chief Complaint  Patient presents with   Hyperglycemia    Antonio [name] is a 52 y.o. male.  HPI 52 year old male with an extensive medical history including alcoholic hepatitis, poorly controlled diabetes, pulmonary embolism, IV drug use, SBP presents to the ER with complaints of whole body aches.  Initially called out for EMS for hyperglycemia.  Patient reports noncompliance with medications because he "does not have the right ones".  He has a wound on the left medial malleolus which is covered with a bandage, patient states that it has been there for 7 months. Denies any cough, shortness of breath, CP, dysuria, abdominal pain.     Home Medications Prior to Admission medications   Medication Sig Start Date End Date Taking? Authorizing Provider  ARIPiprazole (ABILIFY) 10 MG tablet Take 10 mg by mouth daily.   Yes [provider]  gabapentin (NEURONTIN) 600 MG tablet Take 600 mg by mouth 4 (four) times daily.   Yes [provider]  LANTUS SOLOSTAR 100 UNIT/ML Solostar Pen Inject 12 Units into the skin 2 (two) times daily.   Yes [provider]  sertraline (ZOLOFT) 100 MG tablet Take 1 tablet (100 mg total) by mouth daily. 09/18/20  Yes [name], NP  traZODone (DESYREL) 50 MG tablet Take 50 mg by mouth at bedtime.   Yes [provider]  doxycycline (VIBRAMYCIN) 100 MG capsule Take 1 capsule (100 mg total) by mouth 2 (two) times daily. One po bid x 7 days 03/21/22   [name], DO  gabapentin (NEURONTIN) 400 MG capsule Take 1 capsule (400 mg total) by mouth 3 (three) times daily. Patient not taking: Reported on 10/19/2022 09/17/20   [name], NP  metFORMIN (GLUCOPHAGE-XR) 750 MG 24 hr tablet Take 1 tablet (750 mg total) by mouth 2 (two) times daily with a meal. 10/19/22   Mare Ferrari, PA-C   sulfamethoxazole-trimethoprim (BACTRIM DS) 800-160 MG tablet Take 1 tablet by mouth every 12 (twelve) hours. 09/17/20   [name], NP      Allergies    Acetaminophen    Review of Systems   Review of Systems  Physical Exam Updated Vital Signs BP 110/77   Pulse 86   Temp 97.7 F (36.5 C) (Oral)   Resp 16   Ht 5\' 9"  (1.753 m)   Wt 43.6 kg   SpO2 99%   BMI 14.19 kg/m  Physical Exam Vitals and nursing note reviewed.  Constitutional:      General: He is not in acute distress.    Appearance: He is well-developed.     Comments: Disheveled appearing  HENT:     Head: Normocephalic and atraumatic.  Eyes:     Conjunctiva/sclera: Conjunctivae normal.  Cardiovascular:     Rate and Rhythm: Normal rate and regular rhythm.     Heart sounds: No murmur heard. Pulmonary:     Effort: Pulmonary effort is normal. No respiratory distress.     Breath sounds: Normal breath sounds.  Abdominal:     Palpations: Abdomen is soft.     Tenderness: There is no abdominal tenderness.  Musculoskeletal:        General: No swelling.     Cervical back: Neck supple.     Comments: Wound to the medial malleolus of the left foot  Skin:    General:  Skin is warm and dry.     Capillary Refill: Capillary refill takes less than 2 seconds.  Neurological:     Mental Status: He is alert.  Psychiatric:        Mood and Affect: Mood normal.     ED Results / Procedures / Treatments   Labs (all labs ordered are listed, but only abnormal results are displayed) Labs Reviewed  CBC - Abnormal; Notable for the following components:      Result Value   WBC 3.5 (*)    RBC 3.83 (*)    Hemoglobin 11.8 (*)    HCT 35.5 (*)    Platelets 102 (*)    All other components within normal limits  COMPREHENSIVE METABOLIC PANEL - Abnormal; Notable for the following components:   Sodium 131 (*)    Glucose, Bld 410 (*)    Calcium 8.0 (*)    Albumin 3.3 (*)    AST 57 (*)    All other components within normal  limits  URINALYSIS, ROUTINE W REFLEX MICROSCOPIC - Abnormal; Notable for the following components:   Glucose, UA >=500 (*)    All other components within normal limits  CBG MONITORING, ED - Abnormal; Notable for the following components:   Glucose-Capillary 416 (*)    All other components within normal limits  CBG MONITORING, ED - Abnormal; Notable for the following components:   Glucose-Capillary 271 (*)    All other components within normal limits  SARS CORONAVIRUS 2 BY RT PCR  LACTIC ACID, PLASMA    EKG None  Radiology DG Foot 2 Views Left  Result Date: 10/19/2022 CLINICAL DATA:  Diabetic foot wound. EXAM: LEFT FOOT - 2 VIEW COMPARISON:  None Available. FINDINGS: There is no evidence of fracture or dislocation. No evidence of osteolysis or periostitis. Generalized osteopenia noted. Subtalar joint fusion noted, with surgical pin in medial malleolus of the ankle. Soft tissues are unremarkable. IMPRESSION: No acute findings.  No radiographic evidence of osteomyelitis. Electronically Signed   By: Danae Orleans M.D.   On: 10/19/2022 14:29    Procedures Procedures    Medications Ordered in ED Medications  insulin aspart (novoLOG) injection 10 Units (10 Units Subcutaneous Given 10/19/22 1429)  sodium chloride 0.9 % bolus 1,000 mL (1,000 mLs Intravenous New Bag/Given 10/19/22 1429)  ketorolac (TORADOL) 15 MG/ML injection 15 mg (15 mg Intravenous Given 10/19/22 1429)    ED Course/ Medical Decision Making/ A&P                             Medical Decision Making Amount and/or Complexity of Data Reviewed Labs: ordered. Radiology: ordered.  Risk Prescription drug management.   52 year old male who is homeless and poorly compliant with meds presents to the ER with complaints of myalgias.  On arrival, he is disheveled appearing, however vitals are reassuring, he is not tachycardic, tachypneic or hypoxic.  He is afebrile.  Differential is broad, and but cannot include hyperglycemia,  viral infection, dehydration, sepsis  Labs ordered, reviewed, mild hyponatremia 131, other electrolytes normal, glucose of 410 but CO2 and AG normal not consistent with DKA.  AST is elevated at 57 but this is slightly improved from prior.  CBC with a worsening leukopenia of 3.5 (prior 6.5), hemoglobin of 11.8 which appears stable.  Worsening thrombocytopenia with platelets of 102, 143 7 months ago.  CBG 416 on arrival.  COVID and flu negative.  Lactic acid negative.  X-ray of left  ankle ordered, reviewed, agree with radiology read, negative for osteomyelitis.  Overall the wound is well-appearing and does not appear to be acutely infected.  Patient received NS bolus, 10 units of insulin and Toradol.  Urine not consistent with UTI.  Repeat blood sugar 271.  Overall no indication for admission, low suspicion for sepsis or infection.  Wound actually does not appear to be acutely infected. Suspect myalgias or possibly due to hyperglycemia.  Patient encouraged to take his medications.  Our pharmacy checked with [facility], he has not filled his metformin for over a year.  I have refilled this for him.  I encouraged him to follow-up with the PCP given his worsening thrombocytopenia and leukopenia.  He reports feeling better after fluids and Toradol.  We discussed return precautions.  He voiced understanding and is agreeable.  Stable for discharge.  Case discussed Dr. Rodena Medin who was agreeable to the above plan and disposition. Final Clinical Impression(s) / ED Diagnoses Final diagnoses:  Hyperglycemia  Non compliance w medication regimen  Thrombocytopenia (HCC)  Anemia, unspecified type    Rx / DC Orders ED Discharge Orders          Ordered    metFORMIN (GLUCOPHAGE-XR) 750 MG 24 hr tablet  2 times daily with meals        10/19/22 1533              Leone Brand 10/19/22 1537    Wynetta Fines, MD 10/20/22 1359

## 2022-10-20 DIAGNOSIS — F322 Major depressive disorder, single episode, severe without psychotic features: Secondary | ICD-10-CM | POA: Diagnosis not present

## 2022-10-20 LAB — CBG MONITORING, ED
Glucose-Capillary: 402 mg/dL — ABNORMAL HIGH (ref 70–99)
Glucose-Capillary: 324 mg/dL — ABNORMAL HIGH (ref 70–99)
Glucose-Capillary: 180 mg/dL — ABNORMAL HIGH (ref 70–99)

## 2022-10-20 LAB — RAPID URINE DRUG SCREEN, HOSP PERFORMED
Amphetamines: NOT DETECTED
Barbiturates: NOT DETECTED
Benzodiazepines: NOT DETECTED
Cocaine: NOT DETECTED
Opiates: NOT DETECTED
Tetrahydrocannabinol: NOT DETECTED

## 2022-10-20 MED ORDER — INSULIN ASPART 100 UNIT/ML IJ SOLN
0.0000 [IU] | Freq: Every day | INTRAMUSCULAR | Status: DC
  Administered 2022-10-21 – 2022-10-22 (×2): 3 [IU] via SUBCUTANEOUS
  Administered 2022-10-23: 2 [IU] via SUBCUTANEOUS
  Administered 2022-10-24 – 2022-10-25 (×2): 3 [IU] via SUBCUTANEOUS
  Filled 2022-10-20: qty 0.05

## 2022-10-20 MED ORDER — INSULIN ASPART 100 UNIT/ML IJ SOLN
0.0000 [IU] | Freq: Three times a day (TID) | INTRAMUSCULAR | Status: DC
  Administered 2022-10-20: 11 [IU] via SUBCUTANEOUS
  Administered 2022-10-20: 15 [IU] via SUBCUTANEOUS
  Administered 2022-10-20: 3 [IU] via SUBCUTANEOUS
  Administered 2022-10-21: 2 [IU] via SUBCUTANEOUS
  Administered 2022-10-21: 5 [IU] via SUBCUTANEOUS
  Administered 2022-10-21: 8 [IU] via SUBCUTANEOUS
  Administered 2022-10-22: 15 [IU] via SUBCUTANEOUS
  Administered 2022-10-22: 2 [IU] via SUBCUTANEOUS
  Administered 2022-10-22: 15 [IU] via SUBCUTANEOUS
  Administered 2022-10-23: 11 [IU] via SUBCUTANEOUS
  Administered 2022-10-23: 3 [IU] via SUBCUTANEOUS
  Administered 2022-10-23: 8 [IU] via SUBCUTANEOUS
  Administered 2022-10-24: 11 [IU] via SUBCUTANEOUS
  Administered 2022-10-24: 8 [IU] via SUBCUTANEOUS
  Administered 2022-10-24: 0 [IU] via SUBCUTANEOUS
  Administered 2022-10-25: 15 [IU] via SUBCUTANEOUS
  Administered 2022-10-25: 8 [IU] via SUBCUTANEOUS
  Administered 2022-10-25: 2 [IU] via SUBCUTANEOUS
  Administered 2022-10-26: 3 [IU] via SUBCUTANEOUS
  Administered 2022-10-26: 11 [IU] via SUBCUTANEOUS
  Administered 2022-10-26: 15 [IU] via SUBCUTANEOUS
  Filled 2022-10-20: qty 0.15

## 2022-10-20 NOTE — Progress Notes (Signed)
[facility] Psych ED Progress Note  10/20/2022 10:11 AM Antonio [name]  MRN:  [id number]   Principal Problem: <principal problem not specified> Diagnosis:  Active Problems:   MDD (major depressive disorder), recurrent episode, moderate (HCC)   ED Assessment Time Calculation: Start Time: 0930 Stop Time: 0945 Total Time in Minutes (Assessment Completion): 15   Subjective:  During evaluation patient is sitting in bed, attempting to eat breakfast, patient appears weak, having a hard time holding his utensil steady.patient has a disheveled appearance and is wearing hospital scrubs. He appears tired and weak. He is alert/oriented to self only and environment only.  He is calm/cooperative and mood congruent with affect.  Patient speech is very garbled, at times slurred, normal volume, which is slow, patient has fair eye contact.  Patient is having a hard time speaking with provider, and only shakes his head yes and no when provider asked questions.  Patient denies HI/AVH.  Patient continues to endorse suicidal ideations, but not able to tell provider his plan. There is no indication that he is currently responding to internal/external stimuli.   Per nursing staff, patient has been speaking in a garbled manner, stating they are unable to understand him at times, patient is ambulatory without assistance.  Attempted to call patient's brother Terrez Rosales, no answer left the HIPAA compliant voicemail.   Past Psychiatric History: Depression, past history polysubstance abuse  [place] Scale:  Flowsheet Row ED from 10/19/2022 in [facility] Emergency Department at [facility] Most recent reading at 10/19/2022  9:59 PM ED from 10/19/2022 in [facility] Emergency Department at [facility] Most recent reading at 10/19/2022 12:38 PM ED from 03/20/2022 in [facility] Emergency Department at [facility] Most recent reading at 03/20/2022  6:22 PM  C-SSRS RISK CATEGORY High Risk No Risk No Risk        Past Medical History:  Past Medical History:  Diagnosis Date   Alcoholic hepatitis with ascites    liver bx march 2013, no cirrhosis.  Hep b/c negative   Alcoholism /alcohol abuse 05/2011   Ascites    3 to 4 paracentesis from 2/28 to 06/10/11 at [facility] totalling  19 liters.   DVT (deep venous thrombosis) (HCC) 05/2011   Hepatitis    Pulmonary embolism (HCC) 05/2011   sent home on Xarelto.    Past Surgical History:  Procedure Laterality Date   ANKLE SURGERY     ESOPHAGOGASTRODUODENOSCOPY  3.12.2013   Dr Braulio Conte in [place].  Irregular GE Jx, biopsied benign, naso/SB feeding tube inserted    LIVER BIOPSY  06/2011   transjugular   PARACENTESIS     several during admission at [facility] 2/26- 3/18   Family History: History reviewed. No pertinent family history.  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    He is functionally illiterate, reading skills are poor.    Social Determinants of Health   Financial Resource Strain: Low Risk  (04/12/2022)   Received from [facility], [facility]   Overall Financial Resource Strain (CARDIA)    Difficulty of Paying Living Expenses: Not  very hard  Food Insecurity: High Risk (10/16/2022)   Received from [facility]   Food vital sign    Within the past 12 months, you worried that your food would run out before you got money to buy more: Often true    Within the past 12 months, the food you bought just didn't last and you didn't have money to get more. : Often true  Transportation Needs: Not on file (10/16/2022)  Recent Concern: Transportation Needs - Unmet Transportation Needs (10/16/2022)   Received from [facility]    In the past 12 months, has lack of reliable transportation kept you from medical appointments, meetings, work or from getting things needed for daily living? : Yes  Physical Activity: Not on file  Stress: No Stress Concern Present (01/25/2021)   Received from [facility], [facility] of Occupational Health - Occupational Stress Questionnaire    Feeling of Stress : Not at all  Social Connections: Unknown (08/21/2021)   Received from [facility], [facility]   Social Network    Social Network: Not on file    Sleep: Fair  Appetite:  Fair  Current Medications: Current Facility-Administered Medications  Medication Dose Route Frequency Provider Last Rate Last Admin   ARIPiprazole (ABILIFY) tablet 10 mg  10 mg Oral Daily Charlynne Pander, MD   10 mg at 10/20/22 0902   gabapentin (NEURONTIN) capsule 600 mg  600 mg Oral TID WC & HS Danford Bad, RPH   600 mg at 10/20/22 0845   ibuprofen (ADVIL) tablet 800 mg  800 mg Oral Q6H PRN Charlynne Pander, MD   800 mg at 10/19/22 2141   insulin aspart (novoLOG) injection 0-15 Units  0-15 Units Subcutaneous TID WC Kommor, Madison, MD   15 Units at 10/20/22 0902   insulin aspart (novoLOG) injection 0-5 Units  0-5 Units Subcutaneous QHS Kommor, Madison, MD       metFORMIN (GLUCOPHAGE-XR) 24 hr tablet 750 mg  750 mg Oral BID WC Charlynne Pander, MD   750 mg at 10/20/22 0844   sertraline (ZOLOFT) tablet 100 mg  100 mg  Oral Daily Charlynne Pander, MD   100 mg at 10/20/22 2130   traZODone (DESYREL) tablet 50 mg  50 mg Oral QHS Charlynne Pander, MD   50 mg at 10/19/22 2140   Current Outpatient Medications  Medication Sig Dispense Refill   HUMALOG KWIKPEN 100 UNIT/ML KwikPen Inject 2-8 Units into the skin See admin instructions. Inject 2-8 units into the skin four times a day, per sliding scale: BGL 140-180 = 2 units; 181-240 = 4 units; 241-300 = 6 units; 301-350 = 8 units     LANTUS SOLOSTAR 100 UNIT/ML Solostar Pen Inject 12 Units into the skin 2 (two) times daily.     ARIPiprazole (ABILIFY) 10 MG tablet Take 10 mg by mouth daily.  gabapentin (NEURONTIN) 600 MG tablet Take 600 mg by mouth 4 (four) times daily.     metFORMIN (GLUCOPHAGE-XR) 750 MG 24 hr tablet Take 1 tablet (750 mg total) by mouth 2 (two) times daily with a meal. 60 tablet 0   sertraline (ZOLOFT) 100 MG tablet Take 1 tablet (100 mg total) by mouth daily. 30 tablet 0   traZODone (DESYREL) 50 MG tablet Take 50 mg by mouth at bedtime.      Lab Results:  Results for orders placed or performed during the hospital encounter of 10/19/22 (from the past 48 hour(s))  CBC with Differential     Status: Abnormal   Collection Time: 10/19/22  6:37 PM  Result Value Ref Range   WBC 4.5 4.0 - 10.5 K/uL   RBC 3.54 (L) 4.22 - 5.81 MIL/uL   Hemoglobin 11.0 (L) 13.0 - 17.0 g/dL   HCT 16.1 (L) 09.6 - 04.5 %   MCV 92.9 80.0 - 100.0 fL   MCH 31.1 26.0 - 34.0 pg   MCHC 33.4 30.0 - 36.0 g/dL   RDW 40.9 81.1 - 91.4 %   Platelets 100 (L) 150 - 400 K/uL   nRBC 0.0 0.0 - 0.2 %   Neutrophils Relative % 58 %   Neutro Abs 2.6 1.7 - 7.7 K/uL   Lymphocytes Relative 32 %   Lymphs Abs 1.4 0.7 - 4.0 K/uL   Monocytes Relative 7 %   Monocytes Absolute 0.3 0.1 - 1.0 K/uL   Eosinophils Relative 2 %   Eosinophils Absolute 0.1 0.0 - 0.5 K/uL   Basophils Relative 1 %   Basophils Absolute 0.1 0.0 - 0.1 K/uL   Immature Granulocytes 0 %   Abs Immature Granulocytes  0.02 0.00 - 0.07 K/uL    Comment: Performed at [facility], [address], [place], [place] [address]  Comprehensive metabolic panel     Status: Abnormal   Collection Time: 10/19/22  6:37 PM  Result Value Ref Range   Sodium 136 135 - 145 mmol/L   Potassium 3.9 3.5 - 5.1 mmol/L   Chloride 105 98 - 111 mmol/L   CO2 25 22 - 32 mmol/L   Glucose, Bld 147 (H) 70 - 99 mg/dL    Comment: Glucose reference range applies only to samples taken after fasting for at least 8 hours.   BUN 19 6 - 20 mg/dL   Creatinine, Ser 6.21 0.61 - 1.24 mg/dL   Calcium 8.5 (L) 8.9 - 10.3 mg/dL   Total Protein 6.4 (L) 6.5 - 8.1 g/dL   Albumin 3.2 (L) 3.5 - 5.0 g/dL   AST 41 15 - 41 U/L   ALT 34 0 - 44 U/L   Alkaline Phosphatase 124 38 - 126 U/L   Total Bilirubin 0.4 0.3 - 1.2 mg/dL   GFR, Estimated >30 >86 mL/min    Comment: (NOTE) Calculated using the CKD-EPI Creatinine Equation (2021)    Anion gap 6 5 - 15    Comment: Performed at [facility], [address], [place], [place] [address]  Blood gas, venous (at [facility] and [facility])     Status: Abnormal   Collection Time: 10/19/22  6:37 PM  Result Value Ref Range   pH, Ven 7.4 7.25 - 7.43   pCO2, Ven 50 44 - 60 mmHg   pO2, Ven 41 32 - 45 mmHg   Bicarbonate 31.0 (H) 20.0 - 28.0 mmol/L   Acid-Base Excess 5.0 (H) 0.0 - 2.0 mmol/L  O2 Saturation 78.4 %   Patient temperature 37.0     Comment: Performed at [facility], [address] [place], [place] [address]  Ethanol     Status: None   Collection Time: 10/19/22  6:37 PM  Result Value Ref Range   Alcohol, Ethyl (B) <10 <10 mg/dL    Comment: (NOTE) Lowest detectable limit for serum alcohol is 10 mg/dL.  For medical purposes only. Performed at [facility], [address] [place], [place] [address]   Salicylate level     Status: Abnormal   Collection Time: 10/19/22  6:37 PM  Result Value Ref Range   Salicylate Lvl <7.0 (L) 7.0 - 30.0  mg/dL    Comment: Performed at [facility], [address] [place], [place] [address]  Acetaminophen level     Status: Abnormal   Collection Time: 10/19/22  6:37 PM  Result Value Ref Range   Acetaminophen (Tylenol), Serum <10 (L) 10 - 30 ug/mL    Comment: (NOTE) Therapeutic concentrations vary significantly. A range of 10-30 ug/mL  may be an effective concentration for many patients. However, some  are best treated at concentrations outside of this range. Acetaminophen concentrations >150 ug/mL at 4 hours after ingestion  and >50 ug/mL at 12 hours after ingestion are often associated with  toxic reactions.  Performed at [facility], [address] [place], [place] [address]   Troponin I (High Sensitivity)     Status: None   Collection Time: 10/19/22  6:37 PM  Result Value Ref Range   Troponin I (High Sensitivity) <2 <18 ng/L    Comment: (NOTE) Elevated high sensitivity troponin I (hsTnI) values and significant  changes across serial measurements may suggest ACS but many other  chronic and acute conditions are known to elevate hsTnI results.  Refer to the "Links" section for chest pain algorithms and additional  guidance. Performed at [facility], [address], [place], [place] [address]   CK     Status: Abnormal   Collection Time: 10/19/22  6:37 PM  Result Value Ref Range   Total CK 36 (L) 49 - 397 U/L    Comment: Performed at [facility], [address], [place], [place] [address]  I-stat chem 8, ED (not at [facility], [facility] or [facility])     Status: Abnormal   Collection Time: 10/19/22  6:43 PM  Result Value Ref Range   Sodium 141 135 - 145 mmol/L   Potassium 4.1 3.5 - 5.1 mmol/L   Chloride 105 98 - 111 mmol/L   BUN 17 6 - 20 mg/dL   Creatinine, Ser 5.78 0.61 - 1.24 mg/dL   Glucose, Bld 469 (H) 70 - 99 mg/dL    Comment: Glucose reference range applies only to samples taken after fasting for at least 8  hours.   Calcium, Ion 1.22 1.15 - 1.40 mmol/L   TCO2 25 22 - 32 mmol/L   Hemoglobin 11.2 (L) 13.0 - 17.0 g/dL   HCT 62.9 (L) 52.8 - 41.3 %  Rapid urine drug screen (hospital performed)     Status: None   Collection Time: 10/20/22  1:15 AM  Result Value Ref Range   Opiates NONE DETECTED NONE DETECTED   Cocaine NONE DETECTED NONE DETECTED   Benzodiazepines NONE DETECTED NONE DETECTED   Amphetamines NONE DETECTED NONE DETECTED   Tetrahydrocannabinol NONE DETECTED NONE DETECTED   Barbiturates NONE DETECTED NONE DETECTED    Comment: (  NOTE) DRUG SCREEN FOR MEDICAL PURPOSES ONLY.  IF CONFIRMATION IS NEEDED FOR ANY PURPOSE, NOTIFY LAB WITHIN 5 DAYS.  LOWEST DETECTABLE LIMITS FOR URINE DRUG SCREEN Drug Class                     Cutoff (ng/mL) Amphetamine and metabolites    1000 Barbiturate and metabolites    200 Benzodiazepine                 200 Opiates and metabolites        300 Cocaine and metabolites        300 THC                            50 Performed at [facility], [address], [place], [place] [address]   CBG monitoring, ED     Status: Abnormal   Collection Time: 10/20/22  8:46 AM  Result Value Ref Range   Glucose-Capillary 402 (H) 70 - 99 mg/dL    Comment: Glucose reference range applies only to samples taken after fasting for at least 8 hours.    Blood Alcohol level:  Lab Results  Component Value Date   ETH <10 10/19/2022    Physical Findings:  CIWA:    COWS:     Musculoskeletal: Strength & Muscle Tone: within normal limits Gait & Station: normal Patient leans: N/A  Psychiatric Specialty Exam:  Presentation  General Appearance:  Disheveled  Eye Contact: Fleeting  Speech: Garbled; Slurred; Slow  Speech Volume: Decreased  Handedness: Right   Mood and Affect  Mood: Euthymic  Affect: Flat   Thought Process  Thought Processes: Coherent (UTA due to patient garbled speech)  Descriptions of  Associations:Loose  Orientation:Partial  Thought Content:WDL  History of Schizophrenia/Schizoaffective disorder:No  Duration of Psychotic Symptoms:No data recorded Hallucinations:Hallucinations: None  Ideas of Reference:None  Suicidal Thoughts:Suicidal Thoughts: Yes, Passive SI Passive Intent and/or Plan: With Plan  Homicidal Thoughts:Homicidal Thoughts: No   Sensorium  Memory: Other (comment) (UTA due to patient garbled speech)  Judgment: Impaired  Insight: Lacking   Executive Functions  Concentration: Fair  Attention Span: Fair  Recall: [employer] of Knowledge: Fair  Language: Fair   Psychomotor Activity  Psychomotor Activity: Psychomotor Activity: Psychomotor Retardation   Assets  Assets: Communication Skills; Social Support   Sleep  Sleep: Sleep: Fair    Physical Exam: Physical Exam Vitals and nursing note reviewed. Exam conducted with a chaperone present.  Neurological:     Mental Status: He is alert.  Psychiatric:        Attention and Perception: Attention normal.        Mood and Affect: Mood is depressed. Affect is flat.        Speech: Speech is delayed and slurred.        Behavior: Behavior is cooperative.        Thought Content: Thought content includes suicidal ideation. Thought content includes suicidal plan.        Judgment: Judgment is inappropriate.    Review of Systems  Constitutional: Negative.   Psychiatric/Behavioral:  Positive for depression and suicidal ideas.    Blood pressure 123/83, pulse (!) 104, temperature 97.7 F (36.5 C), temperature source Oral, resp. rate 17, height 5\' 9"  (1.753 m), weight 43.6 kg, SpO2 98%. Body mass index is 14.19 kg/m.    Medical Decision Making: Patient needs inpatient psychiatric admission for stabilization and treatment. [facility] and CSW  notified of disposition. Will restart patient home medications. EDP, RN, and LCSW notified of disposition.  Patient does have a history of poorly  controlled diabetes, this morning patient's blood sugar was 402 at 846, ED P is aware. EKG Qtc 471 on 10/20/22.   Rayn Enderson MOTLEY-MANGRUM, PMHNP 10/20/2022, 10:11 AM

## 2022-10-20 NOTE — ED Provider Notes (Signed)
Emergency Medicine Observation Re-evaluation Note  Antonio [name] is a 52 y.o. male, seen on rounds today.  Pt initially presented to the ED for complaints of Loss of Consciousness and Suicidal Currently, the patient is resting comfortably.  Physical Exam  BP 123/83 (BP Location: Left Arm)   Pulse (!) 104   Temp 97.7 F (36.5 C) (Oral)   Resp 17   Ht 5\' 9"  (1.753 m)   Wt 43.6 kg   SpO2 98%   BMI 14.19 kg/m  Physical Exam Vitals and nursing note reviewed.  Constitutional:      General: He is not in acute distress.    Appearance: He is well-developed.  HENT:     Head: Normocephalic and atraumatic.  Eyes:     Conjunctiva/sclera: Conjunctivae normal.  Cardiovascular:     Rate and Rhythm: Normal rate and regular rhythm.     Heart sounds: No murmur heard. Pulmonary:     Effort: Pulmonary effort is normal. No respiratory distress.  Musculoskeletal:        General: No swelling.     Cervical back: Neck supple.  Skin:    General: Skin is warm and dry.  Neurological:     Mental Status: He is alert.  Psychiatric:        Mood and Affect: Mood normal.      ED Course / MDM  EKG:EKG Interpretation Date/Time:  Saturday October 19 2022 18:19:54 EDT Ventricular Rate:  92 PR Interval:  130 QRS Duration:  80 QT Interval:  380 QTC Calculation: 471 R Axis:   85  Text Interpretation: Sinus rhythm Nonspecific T abnrm, anterolateral leads No significant change since last tracing Confirmed by Richardean Canal [phone]) on 10/19/2022 6:37:12 PM  I have reviewed the labs performed to date as well as medications administered while in observation.  Recent changes in the last 24 hours include TTS evaluation with plan to reevaluate this morning.  Patient also with hyperglycemia and patient placed on sliding scale.  Plan  Current plan is for TTS evaluation.    Glendora Score, MD 10/20/22 [phone]

## 2022-10-20 NOTE — ED Notes (Signed)
Patient slept throughout the day.

## 2022-10-21 LAB — CBG MONITORING, ED
Glucose-Capillary: 137 mg/dL — ABNORMAL HIGH (ref 70–99)
Glucose-Capillary: 298 mg/dL — ABNORMAL HIGH (ref 70–99)
Glucose-Capillary: 215 mg/dL — ABNORMAL HIGH (ref 70–99)
Glucose-Capillary: 290 mg/dL — ABNORMAL HIGH (ref 70–99)

## 2022-10-21 LAB — HEMOGLOBIN A1C
Hgb A1c MFr Bld: 9.9 % — ABNORMAL HIGH (ref 4.8–5.6)
Mean Plasma Glucose: 237 mg/dL

## 2022-10-21 NOTE — Progress Notes (Signed)
Pt meets inpatient BH per Alona Bene, PMHNP as 10/20/22. This CSW requested [facility] Fransico Michael, RN to review.   Maryjean Ka, MSW, [facility] 10/21/2022 9:44 PM

## 2022-10-21 NOTE — ED Provider Notes (Signed)
Emergency Medicine Observation Re-evaluation Note  Antonio [name] is a 52 y.o. male, seen on rounds today.  Pt initially presented to the ED for complaints of Loss of Consciousness and Suicidal Currently, the patient is asleep.  Physical Exam  BP (!) 135/90 (BP Location: Left Arm)   Pulse 85   Temp 98.6 F (37 C) (Oral)   Resp 14   Ht 5\' 9"  (1.753 m)   Wt 43.6 kg   SpO2 100%   BMI 14.19 kg/m  Physical Exam General: Asleep, no acute distress Cardiac: Regular rate Lungs: No increased work of breathing Psych: Calm, asleep  ED Course / MDM  EKG:EKG Interpretation Date/Time:  Saturday October 19 2022 18:19:54 EDT Ventricular Rate:  92 PR Interval:  130 QRS Duration:  80 QT Interval:  380 QTC Calculation: 471 R Axis:   85  Text Interpretation: Sinus rhythm Nonspecific T abnrm, anterolateral leads No significant change since last tracing Confirmed by Richardean Canal [phone]) on 10/19/2022 6:37:12 PM  I have reviewed the labs performed to date as well as medications administered while in observation.  Recent changes in the last 24 hours include no significant change.  Plan  Current plan is for pending inpatient.    Rexford Maus, DO 10/21/22 [phone]

## 2022-10-21 NOTE — TOC CM/SW Note (Signed)
CM noted TOC consult for medication assistance/ HH.  Noted patient is homeless and not a candidate for [facility] services as a result.  Patient is also a member of [employer] tailored plan, this plan has prescription benefits and is therefore not a candidate for [facility] assistance. No further TOC needs identified at this time.

## 2022-10-22 LAB — CBG MONITORING, ED
Glucose-Capillary: 357 mg/dL — ABNORMAL HIGH (ref 70–99)
Glucose-Capillary: 399 mg/dL — ABNORMAL HIGH (ref 70–99)
Glucose-Capillary: 132 mg/dL — ABNORMAL HIGH (ref 70–99)
Glucose-Capillary: 275 mg/dL — ABNORMAL HIGH (ref 70–99)

## 2022-10-22 MED ORDER — GABAPENTIN 300 MG PO CAPS: 300.0000 mg | ORAL_CAPSULE | Freq: Three times a day (TID) | ORAL | Status: DC

## 2022-10-22 MED ORDER — ARIPIPRAZOLE 5 MG PO TABS
5.0000 mg | ORAL_TABLET | Freq: Every day | ORAL | Status: DC
  Administered 2022-10-23 – 2022-10-26 (×4): 5 mg via ORAL
  Filled 2022-10-22 (×4): qty 1

## 2022-10-22 MED ORDER — GABAPENTIN 300 MG PO CAPS
300.0000 mg | ORAL_CAPSULE | Freq: Three times a day (TID) | ORAL | Status: DC
  Administered 2022-10-22 – 2022-10-26 (×13): 300 mg via ORAL
  Filled 2022-10-22 (×13): qty 1

## 2022-10-22 NOTE — Progress Notes (Signed)
TOC consulted for substance abuse resources. Pt is under TTS care at this time. Resources attached to AVS. No further TOC needs. TOC signing off.

## 2022-10-22 NOTE — Progress Notes (Signed)
[facility] Psych ED Progress Note  10/22/2022 9:44 AM Antonio [name]  MRN:  [id number]   Subjective:  Patient remains suicidal with plan to jump into traffic.  Patient reports feeling hopeless, helpless and worthless.  He stays occasionally with his brother and some times on the street.  He needs prompt from staff to shower and states that since his stroke two months ago his Depression increased and he has no zeal to do anything.  He is compliant with Medication and is eating and feeding himself.,.  His gait is unsteady since the stroke.  Provider called his brother to gather more collateral information but he did not answer his call. Patient continues to need inpatient Psychiatry hospitalization for safety and stabilization.  We continue to fax out records for bed availability. Principal Problem: MDD (major depressive disorder), recurrent episode, moderate (HCC) Diagnosis:  Principal Problem:   MDD (major depressive disorder), recurrent episode, moderate (HCC)   ED Assessment Time Calculation: No data recorded  Past Psychiatric History: see initial psychiatry evaluation note  [place] Scale:  Flowsheet Row ED from 10/19/2022 in [facility] Emergency Department at [facility] Most recent reading at 10/19/2022  9:59 PM ED from 10/19/2022 in [facility] Department at [facility] Most recent reading at 10/19/2022 12:38 PM ED from 03/20/2022 in [facility] Emergency Department at [facility] Most recent reading at 03/20/2022  6:22 PM  C-SSRS RISK CATEGORY High Risk No Risk No Risk       Past Medical History:  Past Medical History:  Diagnosis Date   Alcoholic hepatitis with ascites    liver bx march 2013, no cirrhosis.  Hep b/c negative   Alcoholism /alcohol abuse 05/2011   Ascites    3 to 4 paracentesis from 2/28 to 06/10/11 at [facility] totalling  19 liters.   DVT (deep venous thrombosis) (HCC) 05/2011   Hepatitis    Pulmonary embolism (HCC) 05/2011    sent home on Xarelto.    Past Surgical History:  Procedure Laterality Date   ANKLE SURGERY     ESOPHAGOGASTRODUODENOSCOPY  3.12.2013   Dr Braulio Conte in [place].  Irregular GE Jx, biopsied benign, naso/SB feeding tube inserted    LIVER BIOPSY  06/2011   transjugular   PARACENTESIS     several during admission at [facility] 2/26- 3/18   Family History: History reviewed. No pertinent family history. 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    He is functionally illiterate,  reading skills are poor.    Social Determinants of Health   Financial Resource Strain: Low Risk  (04/12/2022)   [facility]   Overall Financial Resource Strain (CARDIA)    Difficulty of Paying Living Expenses: Not very hard  Food Insecurity: High Risk (10/16/2022)   Received from [facility]   Food vital sign    Within the past 12 months, you worried that your food would run out before you got money to buy more: Often true    Within the past 12 months, the food you bought just didn't last and you didn't have money to get more. :  Often true  Transportation Needs: Not on file (10/16/2022)  Recent Concern: Transportation Needs - Unmet Transportation Needs (10/16/2022)   Received from [facility]    In the past 12 months, has lack of reliable transportation kept you from medical appointments, meetings, work or from getting things needed for daily living? : Yes  Physical Activity: Not on file  Stress: No Stress Concern Present (01/25/2021)   Received from [facility] Occupational Health - Occupational Stress Questionnaire    Feeling of Stress : Not at all  Social Connections: Unknown (08/21/2021)   [facility]   Social Network    Social Network: Not on file    Sleep: Good  Appetite:  Fair  Current Medications: Current Facility-Administered Medications  Medication Dose Route Frequency Provider Last Rate Last Admin   ARIPiprazole (ABILIFY) tablet 10 mg  10 mg Oral Daily Charlynne Pander, MD   10 mg at 10/22/22 0936   gabapentin (NEURONTIN) capsule 600 mg  600 mg Oral TID WC & HS Danford Bad, RPH   600 mg at 10/22/22 0938   ibuprofen (ADVIL) tablet 800 mg  800 mg Oral Q6H PRN Charlynne Pander, MD   800 mg at 10/19/22 2141   insulin aspart (novoLOG) injection 0-15 Units  0-15 Units Subcutaneous TID WC Kommor, Madison, MD   15 Units at 10/22/22 0936   insulin aspart (novoLOG) injection 0-5 Units  0-5 Units Subcutaneous QHS Kommor, Madison, MD   3 Units at 10/21/22 2254   metFORMIN (GLUCOPHAGE-XR) 24 hr tablet 750 mg  750 mg Oral BID WC Charlynne Pander, MD   750 mg at 10/22/22 1191   sertraline (ZOLOFT) tablet 100 mg  100 mg Oral Daily Charlynne Pander, MD   100 mg at 10/21/22 0955   traZODone (DESYREL) tablet 50 mg  50 mg Oral QHS Charlynne Pander, MD   50 mg at 10/21/22 2254   Current Outpatient Medications  Medication Sig Dispense Refill   HUMALOG KWIKPEN 100 UNIT/ML KwikPen Inject 2-8 Units into the skin See  admin instructions. Inject 2-8 units into the skin four times a day, per sliding scale: BGL 140-180 = 2 units; 181-240 = 4 units; 241-300 = 6 units; 301-350 = 8 units     LANTUS SOLOSTAR 100 UNIT/ML Solostar Pen Inject 12 Units into the skin 2 (two) times daily.     ARIPiprazole (ABILIFY) 10 MG tablet Take 10 mg by mouth daily.     gabapentin (NEURONTIN) 600 MG tablet Take 600 mg by mouth 4 (four) times daily.     metFORMIN (GLUCOPHAGE-XR) 750 MG 24 hr tablet Take 1 tablet (750 mg total) by mouth 2 (two) times daily with a meal. 60 tablet 0   sertraline (ZOLOFT)  100 MG tablet Take 1 tablet (100 mg total) by mouth daily. 30 tablet 0   traZODone (DESYREL) 50 MG tablet Take 50 mg by mouth at bedtime.      Lab Results:  Results for orders placed or performed during the hospital encounter of 10/19/22 (from the past 48 hour(s))  CBG monitoring, ED     Status: Abnormal   Collection Time: 10/20/22  3:37 PM  Result Value Ref Range   Glucose-Capillary 324 (H) 70 - 99 mg/dL    Comment: Glucose reference range applies only to samples taken after fasting for at least 8 hours.  CBG monitoring, ED     Status: Abnormal   Collection Time: 10/20/22  8:08 PM  Result Value Ref Range   Glucose-Capillary 180 (H) 70 - 99 mg/dL    Comment: Glucose reference range applies only to samples taken after fasting for at least 8 hours.   Comment 1 Notify RN    Comment 2 Document in Chart   CBG monitoring, ED     Status: Abnormal   Collection Time: 10/21/22  7:45 AM  Result Value Ref Range   Glucose-Capillary 137 (H) 70 - 99 mg/dL    Comment: Glucose reference range applies only to samples taken after fasting for at least 8 hours.  CBG monitoring, ED     Status: Abnormal   Collection Time: 10/21/22  1:07 PM  Result Value Ref Range   Glucose-Capillary 298 (H) 70 - 99 mg/dL    Comment: Glucose reference range applies only to samples taken after fasting for at least 8 hours.  CBG monitoring, ED     Status: Abnormal    Collection Time: 10/21/22  5:10 PM  Result Value Ref Range   Glucose-Capillary 215 (H) 70 - 99 mg/dL    Comment: Glucose reference range applies only to samples taken after fasting for at least 8 hours.  CBG monitoring, ED     Status: Abnormal   Collection Time: 10/21/22 10:47 PM  Result Value Ref Range   Glucose-Capillary 290 (H) 70 - 99 mg/dL    Comment: Glucose reference range applies only to samples taken after fasting for at least 8 hours.  CBG monitoring, ED     Status: Abnormal   Collection Time: 10/22/22  9:25 AM  Result Value Ref Range   Glucose-Capillary 357 (H) 70 - 99 mg/dL    Comment: Glucose reference range applies only to samples taken after fasting for at least 8 hours.   Comment 1 Notify RN    Comment 2 Document in Chart     Blood Alcohol level:  Lab Results  Component Value Date   ETH <10 10/19/2022    Physical Findings:  CIWA:    COWS:     Musculoskeletal: Strength & Muscle Tone: decreased Gait & Station: unsteady Patient leans: Front  Psychiatric Specialty Exam:  Presentation  General Appearance:  Disheveled; Other (comment) (Emaciated)  Eye Contact: Good  Speech: Slow  Speech Volume: Decreased  Handedness: Right   Mood and Affect  Mood: Depressed  Affect: Flat   Thought Process  Thought Processes: Coherent; Linear  Descriptions of Associations:Intact  Orientation:Partial  Thought Content:WDL  History of Schizophrenia/Schizoaffective disorder:No  Duration of Psychotic Symptoms:No data recorded Hallucinations:No data recorded Ideas of Reference:None  Suicidal Thoughts:No data recorded Homicidal Thoughts:No data recorded  Sensorium  Memory: Other (comment) (UTA due to patient garbled speech)  Judgment: Impaired  Insight: Lacking   Executive Functions  Concentration: Fair  Attention Span: Fair  Recall: Jennelle Human of Knowledge: Fair  Language: Fair   Psychomotor Activity  Psychomotor  Activity:No data recorded  Assets  Assets: Communication Skills; Social Support   Sleep  Sleep:No data recorded   Physical Exam: Physical Exam ROS Blood pressure 98/76, pulse 82, temperature 98 F (36.7 C), temperature source Oral, resp. rate 20, height 5\' 9"  (1.753 m), weight 43.6 kg, SpO2 98%. Body mass index is 14.19 kg/m.   Medical Decision Making: Continue  seeking inpatient Psychiatry hospitalization.  Offer Medications as prescribed.  Problem 1: Recurrent Major Depressive disorder, severe without Psychotic features  Problem 2: Suicide ideation.  Earney Navy, NP-PMHNP-BC 10/22/2022, 9:44 AM

## 2022-10-22 NOTE — ED Provider Notes (Signed)
Emergency Medicine Observation Re-evaluation Note  Antonio [name] is a 52 y.o. male, seen on rounds today.  Pt initially presented to the ED for complaints of Loss of Consciousness and Suicidal Currently, the patient is currently eating breakfast..  Physical Exam  BP 98/76 (BP Location: Left Arm)   Pulse 82   Temp 98 F (36.7 C) (Oral)   Resp 20   Ht 1.753 m (5\' 9" )   Wt 43.6 kg   SpO2 98%   BMI 14.19 kg/m  Physical Exam General: No acute distress.  Psych: Calm and cooperative  ED Course / MDM  EKG:EKG Interpretation Date/Time:  Saturday October 19 2022 18:19:54 EDT Ventricular Rate:  92 PR Interval:  130 QRS Duration:  80 QT Interval:  380 QTC Calculation: 471 R Axis:   85  Text Interpretation: Sinus rhythm Nonspecific T abnrm, anterolateral leads No significant change since last tracing Confirmed by Richardean Canal [phone]) on 10/19/2022 6:37:12 PM  I have reviewed the labs performed to date as well as medications administered while in observation.  Recent changes in the last 24 hours include patient was to be discharged yesterday.  Had possible syncopal event.  Today feels better at this time.  Patient normally uses a cane to walk around.  He is able to ambulate at his baseline.  Will discharge with resources.  Plan  Current plan is for discharge.    Lorre Nick, MD 10/22/22 [phone]

## 2022-10-22 NOTE — ED Notes (Signed)
Pt calm and cooperative during shift, slept most of time.

## 2022-10-22 NOTE — ED Notes (Signed)
Dinner tray to bedside

## 2022-10-23 LAB — CBG MONITORING, ED
Glucose-Capillary: 338 mg/dL — ABNORMAL HIGH (ref 70–99)
Glucose-Capillary: 253 mg/dL — ABNORMAL HIGH (ref 70–99)
Glucose-Capillary: 214 mg/dL — ABNORMAL HIGH (ref 70–99)
Glucose-Capillary: 187 mg/dL — ABNORMAL HIGH (ref 70–99)
Glucose-Capillary: 246 mg/dL — ABNORMAL HIGH (ref 70–99)

## 2022-10-23 MED ORDER — INSULIN GLARGINE-YFGN 100 UNIT/ML ~~LOC~~ SOLN
12.0000 [IU] | Freq: Every day | SUBCUTANEOUS | Status: DC
  Administered 2022-10-23 – 2022-10-25 (×3): 12 [IU] via SUBCUTANEOUS
  Filled 2022-10-23 (×5): qty 0.12

## 2022-10-23 NOTE — Progress Notes (Signed)
Pt was accepted to [facility] 10/24/2022, pending medication supply or prescription to bring with him. Bed assignment: Main campus  Pt meets inpatient criteria per Phebe Colla, NP  Attending Physician will be Loni Beckwith, MD  Report can be called to: [phone] (this is a pager, please leave call-back number when giving report)  Pt can arrive after 8 AM  Care Team Notified: Phebe Colla, NP and Buck Mam, RN  [place], LCSW  10/23/2022 2:26 PM

## 2022-10-23 NOTE — Progress Notes (Signed)
LCSW Progress Note  [id number]   Antonio [name]  10/23/2022  1:19 PM  Description:   Inpatient Psychiatric Referral  Patient was recommended inpatient per Phebe Colla, NP. There are no available beds at [facility], per [facility] Rona Ravens, RN. Patient was referred to the following out of network facilities:   Destination  Service Provider Address Phone Fax  [facility]  [address] [place], [place] [address] [phone]  [facility]  [address] [place] [address] [phone]  [facility] [address] [place]  [address] [place] [address] [phone]  [facility]  [address], [place] [address] [phone]  [facility]  [address] [place] [address] [phone]  [facility]  [address] [place] [address] [phone]  [facility]  [address] [place] [address] [phone]  [facility]  [address], [place] [address] [phone]  [facility]  [address] [place] [address] [phone]  [facility]  [address], [place] [address] [phone]  [facility]  [address], [place] [address] [phone]  [facility]  [address], [place] [address] [phone]  [facility]  [address], [place] [address] [phone]  [facility]  [address] [place] [address] [phone]  [facility]  [address], [place] [address] [phone]  [facility]  [address] [place] [address]  [phone]  [facility]  [address], [place] [address] [phone]  [facility]  [address] [place] [address] [phone]  [facility] [address]  [address] [place] [address] [phone]  [facility]  [address] [place] [address] [phone]    Situation ongoing, CSW to continue following and update chart as more information becomes available.      Cathie Beams, LCSW  10/23/2022 1:19 PM

## 2022-10-23 NOTE — Progress Notes (Signed)
CSW received a phone call advising pt is under review at [facility]. 1st shift to follow up.    Maryjean Ka, MSW, LCSWA 10/23/2022 2:09 AM

## 2022-10-23 NOTE — Progress Notes (Signed)
LCSW Progress Note  [id number]   Antonio [name]  10/23/2022  1:06 AM    Inpatient Behavioral Health Placement  Pt meets inpatient criteria per Earney Navy, NP-PMHNP-BC. There are no available beds within [facility] per [facility] Kim Brooks,RN. Referral was sent to the following facilities;   Destination  Service Provider Address Phone [facility]  [address] [place], [place] [address] [phone]  [facility]  [address] [place] [address] [phone]  [facility] [address] [place]  [address], [place] [address] [phone]  [facility]  [address] [place] [address] [phone]  [facility]  [address] [place] [address] [phone]  [facility]  [address] [place] [address] [phone]  [facility]  [address] [place] [address] [phone]  [facility]  [address] [place] [address] [phone]  [facility]  [address] [place] [address] [phone]  [facility]  [address] [place] [address] [phone]  [facility]  [address] [place] [address] [phone]  [facility]  [address] [place] [address] [phone]  [facility]  [address] [place], [place] [address] [phone]  [facility]  [address] [place] [address] [phone]  [facility]  [address] [place] [address] [phone]  [facility]  [address] [place] [address]  [phone]  [facility]  [address], [place] [address] [phone]  [facility]  [address] [place] [address] [phone]  [facility] [address]  [address] [place] [address] [phone]  [facility]  [address], [place] [address] [phone]     Situation ongoing,  CSW will follow up.    Maryjean Ka, MSW, LCSWA 10/23/2022 1:06 AM

## 2022-10-23 NOTE — Progress Notes (Addendum)
ADDENDUM  Pt has been re-reviewed by [facility] provider. Pt has now been accepted to [facility] for tomorrow 7/18.  10:17 AM - CSW spoke with [place], intake coordinator, at [facility] via phone call to follow up. Pt has been denied by [facility] due to uncontrolled diabetes and wound to the medial malleolus of the left foot. CSW will continue to assist an follow with placement.  Cathie Beams, LCSW  10/23/2022 10:28 AM

## 2022-10-23 NOTE — Inpatient Diabetes Management (Signed)
Inpatient Diabetes Program Recommendations  AACE/ADA: New Consensus Statement on Inpatient Glycemic Control (2015)  Target Ranges:  Prepandial:   less than 140 mg/dL      Peak postprandial:   less than 180 mg/dL (1-2 hours)      Critically ill patients:  140 - 180 mg/dL   Lab Results  Component Value Date   GLUCAP 338 (H) 10/23/2022   HGBA1C 9.9 (H) 10/19/2022    Review of Glycemic Control  Diabetes history: DM2 Outpatient Diabetes medications: Humalog 2-8 QID, Lantus 12 units BID, metformin 750 BID Current orders for Inpatient glycemic control: Novolog 0-15 TID and 0-5 HS, metformin 750 mg BID  HgbA1C - 9.9%  Inpatient Diabetes Program Recommendations:    Consider adding Lantus 12 units every day  Continue to follow.  Thank you. Ailene Ards, RD, LDN, CDCES Inpatient Diabetes Coordinator [phone]

## 2022-10-23 NOTE — ED Provider Notes (Signed)
Emergency Medicine Observation Re-evaluation Note  Khalen Styer is a 52 y.o. male, seen on rounds today.  Pt initially presented to the ED for complaints of Loss of Consciousness and Suicidal Currently, the patient is resting comfortably in bed.  Eating breakfast.  Blood sugars noted to be slightly elevated and covered by sliding scale.  Physical Exam  BP 112/84 (BP Location: Left Arm)   Pulse 91   Temp 98.4 F (36.9 C) (Oral)   Resp 20   Ht 1.753 m (5\' 9" )   Wt 43.6 kg   SpO2 98%   BMI 14.19 kg/m  Physical Exam   ED Course / MDM  EKG:EKG Interpretation Date/Time:  Saturday October 19 2022 18:19:54 EDT Ventricular Rate:  92 PR Interval:  130 QRS Duration:  80 QT Interval:  380 QTC Calculation: 471 R Axis:   85  Text Interpretation: Sinus rhythm Nonspecific T abnrm, anterolateral leads No significant change since last tracing Confirmed by Richardean Canal [phone]) on 10/19/2022 6:37:12 PM  I have reviewed the labs performed to date as well as medications administered while in observation.  Recent changes in the last 24 hours include none.  Plan  Current plan [facility].    Lorre Nick, MD 10/23/22 [phone]

## 2022-10-24 DIAGNOSIS — F322 Major depressive disorder, single episode, severe without psychotic features: Secondary | ICD-10-CM | POA: Diagnosis not present

## 2022-10-24 LAB — CBG MONITORING, ED
Glucose-Capillary: 308 mg/dL — ABNORMAL HIGH (ref 70–99)
Glucose-Capillary: 294 mg/dL — ABNORMAL HIGH (ref 70–99)
Glucose-Capillary: 126 mg/dL — ABNORMAL HIGH (ref 70–99)
Glucose-Capillary: 262 mg/dL — ABNORMAL HIGH (ref 70–99)

## 2022-10-24 NOTE — Progress Notes (Signed)
LCSW Progress Note  [id number]   Antonio [name]  10/24/2022  12:31 PM  Description:   Inpatient Psychiatric Referral  Patient was recommended inpatient per [facility], PMHNP. There are no available beds at [facility] or [facility], per [facility], RN. Patient was referred to the following out of network facilities:   Destination  Service Provider Address Phone Fax  [facility]  [address] [place], [place] [address] [phone]  [facility]  [address] [place] [address] [phone]  [facility] [address] [place]  [address], [place] [address] [phone]  [facility]  [address] [place] [address] [phone]  [facility]  [address] [place] [address] [phone]  [facility]  [address] [place] [address] [phone]  [facility]  [address] [place] [address] [phone]  [facility]  [address] [place] [address] [phone]  [facility]  [address] [place] [address] [phone]  [facility]  [address] [place] [address] [phone]  [facility]  [address], [place] [address] [phone]  [facility]  [address] [place] [address] [phone]  [facility]  [address], [place] [address] [phone]  [facility]  [address] [place], [place] [address] [phone]  [facility]  [address], [place] [address] [phone]  [facility]  [address] [place] [address] [phone]  [facility]  [address], [place] [address] [phone]  [facility]  [address] [place] [address] [phone]  [facility] [address]  [address] [place] [address] [phone]  [facility]  [address] [place] [address] [phone]  [facility]  [address] [place], [place] [address] [phone]  [facility]  [address] [place] [address] [phone]  [facility]  [address] [place] [address] [phone]  [facility]  [address] [place], [place] [address] [phone]  [facility]  [address], [place] [address] [phone]  [facility]  [address], [place] [address] [phone]  [facility]  [address] [place] [address] [phone]  [facility]  [address] [place] [address] [phone]  [facility]  [address] [place] [address] [phone]    Situation ongoing, CSW to continue following and update chart as more information becomes available.      Cathie Beams, [place]  10/24/2022 12:31 PM

## 2022-10-24 NOTE — ED Notes (Signed)
Pt currently sleeping with no signs of distress or discomfort noted respirations appear easy and skin color appears WNL for pt ethinicity.

## 2022-10-24 NOTE — ED Notes (Signed)
Report given to RN at [facility].  ?

## 2022-10-24 NOTE — Progress Notes (Signed)
[facility] ED Progress Note  10/24/2022 7:39 PM Antonio [name]  MRN:  [id number]    Principal Problem: MDD (major depressive disorder), recurrent episode, moderate (HCC) Diagnosis:  Principal Problem:   MDD (major depressive disorder), recurrent episode, moderate (HCC)   ED Assessment Time Calculation: Start Time: 1300 Stop Time: 1320 Total Time in Minutes (Assessment Completion): 20   Subjective: On evaluation, patient laying in his bed, no acute distress. He is alert and oriented x3 to person, place, and situation. He is calm and cooperative during this assessment. His appearance is appropriate for environment, but disheveled. His eye contact is fair. Speech is garbled but coherent, normal pace and decreased volume. He reports his mood as "ok".  Affect is congruent with mood.  Thought process is coherent.  He denies auditory and visual hallucinations.  No indication that he is responding to internal stimuli during this assessment.  No delusions elicited during this assessment. He denies homicidal ideations. Appetite and sleep are fair, patient has been eating more since in hospital.  Patient continues to endorse SI with a plan to jump in front of traffic or jump off a bridge.  Patient complains of left foot pain, states he was seen by the ED physician, says his foot may be infected. Patient has a wound to the medial malleolus of the left foot, ED physicians are aware. Patient refused to contract for safety.   Past Psychiatric History: Depression, past history polysubstance abuse   [place] Scale:  Flowsheet Row ED from 10/19/2022 in [facility] Department at [facility] Most recent reading at 10/19/2022  9:59 PM ED from 10/19/2022 in [facility] at [facility] Most recent reading at 10/19/2022 12:38 PM ED from 03/20/2022 in [facility] Department at [facility] Most recent reading at 03/20/2022  6:22 PM  C-SSRS RISK CATEGORY  High Risk No Risk No Risk       Past Medical History:  Past Medical History:  Diagnosis Date   Alcoholic hepatitis with ascites    liver bx march 2013, no cirrhosis.  Hep b/c negative   Alcoholism /alcohol abuse 05/2011   Ascites    3 to 4 paracentesis from 2/28 to 06/10/11 at [facility] totalling  19 liters.   DVT (deep venous thrombosis) (HCC) 05/2011   Hepatitis    Pulmonary embolism (HCC) 05/2011   sent home on Xarelto.    Past Surgical History:  Procedure Laterality Date   ANKLE SURGERY     ESOPHAGOGASTRODUODENOSCOPY  3.12.2013   Dr Braulio Conte in [place].  Irregular GE Jx, biopsied benign, naso/SB feeding tube inserted    LIVER BIOPSY  06/2011   transjugular   PARACENTESIS     several during admission at [facility] 2/26- 3/18   Family History: History reviewed. No pertinent family history.  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    He is functionally illiterate, reading skills are poor.    Social Determinants of Health   Financial Resource Strain: Low Risk  (04/12/2022)   [facility]   Overall Financial Resource Strain (CARDIA)    Difficulty of  Paying Living Expenses: Not very hard  Food Insecurity: High Risk (10/16/2022)   [facility]   Food vital sign    Within the past 12 months, you worried that your food would run out before you got money to buy more: Often true    Within the past 12 months, the food you bought just didn't last and you didn't have money to get more. : Often true  Transportation Needs: Not on file (10/16/2022)  Recent Concern: Transportation Needs - Unmet Transportation Needs (10/16/2022)   Received from [facility]    In the past 12 months, has lack of reliable transportation kept you from medical appointments, meetings, work or from getting things needed for daily living? : Yes  Physical Activity: Not on file  Stress: No Stress Concern Present (01/25/2021)   [facility] from [facility] of Occupational Health - Occupational Stress Questionnaire    Feeling of Stress : Not at all  Social Connections: Unknown (08/21/2021)   [facility]   Social Network    Social Network: Not on file    Sleep: Fair  Appetite:  Fair  Current Medications: Current Facility-Administered Medications  Medication Dose Route Frequency Provider Last Rate Last Admin   ARIPiprazole (ABILIFY) tablet 5 mg  5 mg Oral Daily Onuoha, Josephine C, NP   5 mg at 10/24/22 0902   gabapentin (NEURONTIN) capsule 300 mg  300 mg Oral TID Dahlia Byes C, NP   300 mg at 10/24/22 1611   ibuprofen (ADVIL) tablet 800 mg  800 mg Oral Q6H PRN Charlynne Pander, MD   800 mg at 10/22/22 2114   insulin aspart (novoLOG) injection 0-15 Units  0-15 Units Subcutaneous TID WC Kommor, [place], MD   0 Units at 10/24/22 1743   insulin aspart (novoLOG) injection 0-5 Units  0-5 Units Subcutaneous QHS Kommor, Wyn Forster, MD   2 Units at 10/23/22 2221   insulin glargine-yfgn (SEMGLEE) injection 12 Units  12 Units Subcutaneous Daily Lorre Nick, MD   12 Units at  10/24/22 0902   metFORMIN (GLUCOPHAGE-XR) 24 hr tablet 750 mg  750 mg Oral BID WC Charlynne Pander, MD   750 mg at 10/24/22 1742   sertraline (ZOLOFT) tablet 100 mg  100 mg Oral Daily Charlynne Pander, MD   100 mg at 10/24/22 0902   traZODone (DESYREL) tablet 50 mg  50 mg Oral QHS Charlynne Pander, MD   50 mg at 10/23/22 2112   Current Outpatient Medications  Medication Sig Dispense Refill   HUMALOG KWIKPEN 100 UNIT/ML KwikPen Inject 2-8 Units into the skin See admin instructions. Inject 2-8 units into the skin four times a day, per sliding scale: BGL 140-180 = 2 units; 181-240 = 4 units; 241-300 = 6 units; 301-350 = 8 units  LANTUS SOLOSTAR 100 UNIT/ML Solostar Pen Inject 12 Units into the skin 2 (two) times daily.     ARIPiprazole (ABILIFY) 10 MG tablet Take 10 mg by mouth daily.     gabapentin (NEURONTIN) 600 MG tablet Take 600 mg by mouth 4 (four) times daily.     metFORMIN (GLUCOPHAGE-XR) 750 MG 24 hr tablet Take 1 tablet (750 mg total) by mouth 2 (two) times daily with a meal. 60 tablet 0   sertraline (ZOLOFT) 100 MG tablet Take 1 tablet (100 mg total) by mouth daily. 30 tablet 0   traZODone (DESYREL) 50 MG tablet Take 50 mg by mouth at bedtime.      Lab Results:  Results for orders placed or performed during the hospital encounter of 10/19/22 (from the past 48 hour(s))  CBG monitoring, ED     Status: Abnormal   Collection Time: 10/22/22  8:58 PM  Result Value Ref Range   Glucose-Capillary 275 (H) 70 - 99 mg/dL    Comment: Glucose reference range applies only to samples taken after fasting for at least 8 hours.  CBG monitoring, ED     Status: Abnormal   Collection Time: 10/23/22  7:37 AM  Result Value Ref Range   Glucose-Capillary 338 (H) 70 - 99 mg/dL    Comment: Glucose reference range applies only to samples taken after fasting for at least 8 hours.  CBG monitoring, ED     Status: Abnormal   Collection Time: 10/23/22 11:44 AM  Result Value Ref Range    Glucose-Capillary 253 (H) 70 - 99 mg/dL    Comment: Glucose reference range applies only to samples taken after fasting for at least 8 hours.  CBG monitoring, ED     Status: Abnormal   Collection Time: 10/23/22  1:48 PM  Result Value Ref Range   Glucose-Capillary 214 (H) 70 - 99 mg/dL    Comment: Glucose reference range applies only to samples taken after fasting for at least 8 hours.  CBG monitoring, ED     Status: Abnormal   Collection Time: 10/23/22  4:44 PM  Result Value Ref Range   Glucose-Capillary 187 (H) 70 - 99 mg/dL    Comment: Glucose reference range applies only to samples taken after fasting for at least 8 hours.  CBG monitoring, ED     Status: Abnormal   Collection Time: 10/23/22  8:59 PM  Result Value Ref Range   Glucose-Capillary 246 (H) 70 - 99 mg/dL    Comment: Glucose reference range applies only to samples taken after fasting for at least 8 hours.  CBG monitoring, ED     Status: Abnormal   Collection Time: 10/24/22  7:49 AM  Result Value Ref Range   Glucose-Capillary 308 (H) 70 - 99 mg/dL    Comment: Glucose reference range applies only to samples taken after fasting for at least 8 hours.  CBG monitoring, ED     Status: Abnormal   Collection Time: 10/24/22 11:14 AM  Result Value Ref Range   Glucose-Capillary 294 (H) 70 - 99 mg/dL    Comment: Glucose reference range applies only to samples taken after fasting for at least 8 hours.  CBG monitoring, ED     Status: Abnormal   Collection Time: 10/24/22  4:51 PM  Result Value Ref Range   Glucose-Capillary 126 (H) 70 - 99 mg/dL    Comment: Glucose reference range applies only to samples taken after fasting for at least 8 hours.    Blood Alcohol level:  Lab Results  Component Value Date   ETH <10 10/19/2022    Physical Findings:  CIWA:    COWS:     Musculoskeletal:  Patient observed resting in bed.  Patient has a wound to the medial malleolus of the left foot, and is stating it is too painful for him to walk  right now.  Psychiatric Specialty Exam:  Presentation  General Appearance:  Disheveled  Eye Contact: Fleeting  Speech: Garbled  Speech Volume: Decreased  Handedness: Right   Mood and Affect  Mood: Depressed; Hopeless  Affect: Constricted; Flat   Thought Process  Thought Processes: Coherent  Descriptions of Associations:Intact  Orientation:Partial  Thought Content:WDL  History of Schizophrenia/Schizoaffective disorder:No  Duration of Psychotic Symptoms:No data recorded Hallucinations:Hallucinations: None  Ideas of Reference:None  Suicidal Thoughts:Suicidal Thoughts: Yes, Active SI Active Intent and/or Plan: With Plan  Homicidal Thoughts:Homicidal Thoughts: No   Sensorium  Memory: Immediate Fair; Recent Fair  Judgment: Fair  Insight: Fair   Chartered certified accountant: Fair  Attention Span: Fair  Recall: [employer] of Knowledge: Fair  Language: Fair   Psychomotor Activity  Psychomotor Activity: Psychomotor Activity: Normal   Assets  Assets: Manufacturing systems engineer; Desire for Improvement; Social Support   Sleep  Sleep: Sleep: Fair    Physical Exam: Physical Exam Vitals and nursing note reviewed. Exam conducted with a chaperone present.  Psychiatric:        Attention and Perception: Attention normal.        Mood and Affect: Mood is depressed. Affect is flat.        Speech: Speech is slurred.        Behavior: Behavior is cooperative.        Thought Content: Thought content includes suicidal ideation. Thought content includes suicidal plan.        Cognition and Memory: Cognition is impaired.        Judgment: Judgment is inappropriate.    Review of Systems  Constitutional: Negative.   Psychiatric/Behavioral:  Positive for depression and suicidal ideas.    Blood pressure 113/80, pulse 89, temperature 99 F (37.2 C), temperature source Oral, resp. rate 18, height 5\' 9"  (1.753 m), weight 43.6 kg, SpO2 100%.  Body mass index is 14.19 kg/m.   Medical Decision Making: Continue seeking inpatient Psychiatry hospitalization. Offer Medications as prescribed.  Patient has been faxed out to several inpatient psychiatric facilities, awaiting for response.  Patient denied to [facility] due to concern of pt's weight which must be BMI over 16 and medical stability.     Tyrin Herbers MOTLEY-MANGRUM, PMHNP 10/24/2022, 7:39 PM

## 2022-10-24 NOTE — ED Notes (Signed)
Patient alert oriented resting at this time. Denied by [facility] due to BMI below 16 will be considered for other options.

## 2022-10-24 NOTE — ED Provider Notes (Signed)
Emergency Medicine Observation Re-evaluation Note  Antonio [name] is a 52 y.o. male, seen on rounds today.  Pt initially presented to the ED for complaints of Loss of Consciousness and Suicidal Currently, the patient is resting.  Physical Exam  BP (!) 124/96   Pulse 95   Temp 98.3 F (36.8 C)   Resp 19   Ht 5\' 9"  (1.753 m)   Wt 43.6 kg   SpO2 100%   BMI 14.19 kg/m  Physical Exam General: no acute distress Lungs: normal effort Psych: no agitation  ED Course / MDM  EKG:EKG Interpretation Date/Time:  Saturday October 19 2022 18:19:54 EDT Ventricular Rate:  92 PR Interval:  130 QRS Duration:  80 QT Interval:  380 QTC Calculation: 471 R Axis:   85  Text Interpretation: Sinus rhythm Nonspecific T abnrm, anterolateral leads No significant change since last tracing Confirmed by Richardean Canal [phone]) on 10/19/2022 6:37:12 PM  I have reviewed the labs performed to date as well as medications administered while in observation.  Recent changes in the last 24 hours include insulin adjusted, Abilify given.  Plan  Current plan is for transfer to [facility]. Accepting doctor is Sherian Rein, MD 10/24/22 [phone]

## 2022-10-24 NOTE — Progress Notes (Addendum)
CSW was advised that pt has been denied by [facility] due to concern of pt's weight which must be BMI over 16 and medical stability. Per JKKXFGH with [facility] Intake advised that a Wellsite geologist to Medical Director review request can be made, however pt has been denied per nursing Intake.  At this time CSW has requested pt be reviewed at [facility] by [facility] Brook McNichol,RN.   Care Team notified: [facility] Brook McNichol,RN,[place], Geralynn Ochs Cantril, PMHNP   Maryjean Ka, MSW, [facility] 10/24/2022 10:21 AM

## 2022-10-25 DIAGNOSIS — E1165 Type 2 diabetes mellitus with hyperglycemia: Secondary | ICD-10-CM | POA: Diagnosis not present

## 2022-10-25 DIAGNOSIS — M791 Myalgia, unspecified site: Secondary | ICD-10-CM | POA: Diagnosis not present

## 2022-10-25 DIAGNOSIS — Z7984 Long term (current) use of oral hypoglycemic drugs: Secondary | ICD-10-CM | POA: Diagnosis not present

## 2022-10-25 DIAGNOSIS — Z794 Long term (current) use of insulin: Secondary | ICD-10-CM | POA: Diagnosis not present

## 2022-10-25 LAB — BASIC METABOLIC PANEL
Anion gap: 9 (ref 5–15)
BUN: 26 mg/dL — ABNORMAL HIGH (ref 6–20)
CO2: 26 mmol/L (ref 22–32)
Calcium: 9.1 mg/dL (ref 8.9–10.3)
Chloride: 102 mmol/L (ref 98–111)
Creatinine, Ser: 0.87 mg/dL (ref 0.61–1.24)
GFR, Estimated: >60 mL/min (ref >60)
Glucose, Bld: 157 mg/dL — ABNORMAL HIGH (ref 70–99)
Potassium: 3.9 mmol/L (ref 3.5–5.1)
Sodium: 137 mmol/L (ref 135–145)

## 2022-10-25 LAB — CBC WITH DIFFERENTIAL/PLATELET
Abs Immature Granulocytes: 0.02 10*3/uL (ref 0.00–0.07)
Basophils Absolute: 0.1 10*3/uL (ref 0.0–0.1)
Basophils Relative: 1 %
Eosinophils Absolute: 0.1 10*3/uL (ref 0.0–0.5)
Eosinophils Relative: 2 %
HCT: 37.1 % — ABNORMAL LOW (ref 39.0–52.0)
Hemoglobin: 12.6 g/dL — ABNORMAL LOW (ref 13.0–17.0)
Immature Granulocytes: 0 %
Lymphocytes Relative: 33 %
Lymphs Abs: 1.8 10*3/uL (ref 0.7–4.0)
MCH: 30.7 pg (ref 26.0–34.0)
MCHC: 34 g/dL (ref 30.0–36.0)
MCV: 90.5 fL (ref 80.0–100.0)
Monocytes Absolute: 0.3 10*3/uL (ref 0.1–1.0)
Monocytes Relative: 6 %
Neutro Abs: 3.1 10*3/uL (ref 1.7–7.7)
Neutrophils Relative %: 58 %
Platelets: 141 10*3/uL — ABNORMAL LOW (ref 150–400)
RBC: 4.1 MIL/uL — ABNORMAL LOW (ref 4.22–5.81)
RDW: 13.9 % (ref 11.5–15.5)
WBC: 5.3 10*3/uL (ref 4.0–10.5)
nRBC: 0 % (ref 0.0–0.2)

## 2022-10-25 LAB — CBG MONITORING, ED
Glucose-Capillary: 297 mg/dL — ABNORMAL HIGH (ref 70–99)
Glucose-Capillary: 376 mg/dL — ABNORMAL HIGH (ref 70–99)
Glucose-Capillary: 128 mg/dL — ABNORMAL HIGH (ref 70–99)
Glucose-Capillary: 150 mg/dL — ABNORMAL HIGH (ref 70–99)
Glucose-Capillary: 269 mg/dL — ABNORMAL HIGH (ref 70–99)

## 2022-10-25 NOTE — ED Notes (Signed)
[place] from [facility] called reference placement and was told that d/t pt BMI being <16, he was rejected from [facility]. She said she would talk to her intake RN about placement.

## 2022-10-25 NOTE — ED Provider Notes (Signed)
Emergency Medicine Observation Re-evaluation Note  Khalil Szczepanik is a 52 y.o. male, seen on rounds today.  Pt initially presented to the ED for complaints of Loss of Consciousness and Suicidal Currently, the patient is resting.  Physical Exam  BP 109/82 (BP Location: Left Arm)   Pulse 96   Temp 97.7 F (36.5 C) (Oral)   Resp 18   Ht 5\' 9"  (1.753 m)   Wt 43.6 kg   SpO2 100%   BMI 14.19 kg/m  Physical Exam General: resting   ED Course / MDM  EKG:EKG Interpretation Date/Time:  Saturday October 19 2022 18:19:54 EDT Ventricular Rate:  92 PR Interval:  130 QRS Duration:  80 QT Interval:  380 QTC Calculation: 471 R Axis:   85  Text Interpretation: Sinus rhythm Nonspecific T abnrm, anterolateral leads No significant change since last tracing Confirmed by Richardean Canal [phone]) on 10/19/2022 6:37:12 PM  I have reviewed the labs performed to date as well as medications administered while in observation.  Recent changes in the last 24 hours include nothing.  Plan  Current plan is for anticipate [facility] to [facility] today for in pt care.    Virgina Norfolk, DO 10/25/22 [phone]

## 2022-10-25 NOTE — ED Notes (Signed)
Spoke with [place] at [facility] and she said she would look to see if they had an appropriate bed and she would call back.

## 2022-10-25 NOTE — Progress Notes (Signed)
This CSW requested [facility] Gretta Arab, RN to review for inpatient BH placement within the [facility] system. Pt continues to meet inpatient BH placement per [facility], PMHNP. CSW/ Disposition team will assist and follow up with placement.  Maryjean Ka, MSW, LCSWA 10/25/2022 11:14 PM

## 2022-10-25 NOTE — ED Notes (Addendum)
MD called to assess patient. Pt called out stating, "My sugar has dropped." Pt noted to be diaphoretic and appears pale. Pt endorses dizziness and "feels woozy." CBG obtained, 150. MD to bedside. Verbal order for EKG, CBC and BMP.

## 2022-10-25 NOTE — Progress Notes (Signed)
[facility] Psych ED Progress Note  10/25/2022 5:52 PM Antonio [name]  MRN:  [id number]    Principal Problem: MDD (major depressive disorder), recurrent episode, moderate (HCC) Diagnosis:  Principal Problem:   MDD (major depressive disorder), recurrent episode, moderate (HCC)   ED Assessment Time Calculation: Start Time: 1600 Stop Time: 1615 Total Time in Minutes (Assessment Completion): 15   Subjective:  During evaluation patient is sitting in bed, eating graham crackers and coffee, he appears to be in no acute distress. His appearance is appropriate for environment. He is alert and oriented x3 to person, place, and situation. He reports his mood is "good". He is calm/cooperative and mood congruent with affect.  Patient speech is cleared today but garbled with decreased volume, which is slow, patient has fair eye contact. Patient denies HI/AVH.  Patient continues to endorse suicidal ideations, but not able to tell provider his plan. There is no indication that he is currently responding to internal/external stimuli.  Patient also asking if he could go to a nursing home, he states he is tired of being in the hospital.   Past Psychiatric History: MDD (major depressive disorder), recurrent episode, moderate (HCC)   [place] Scale:  Flowsheet Row ED from 10/19/2022 in [facility] Emergency Department at [facility] Most recent reading at 10/19/2022  9:59 PM ED from 10/19/2022 in [facility] Emergency Department at [facility] Most recent reading at 10/19/2022 12:38 PM ED from 03/20/2022 in [facility] Emergency Department at [facility] Most recent reading at 03/20/2022  6:22 PM  C-SSRS RISK CATEGORY High Risk No Risk No Risk       Past Medical History:  Past Medical History:  Diagnosis Date   Alcoholic hepatitis with ascites    liver bx march 2013, no cirrhosis.  Hep b/c negative   Alcoholism /alcohol abuse 05/2011   Ascites    3 to 4 paracentesis from 2/28 to 06/10/11 at  [facility] totalling  19 liters.   DVT (deep venous thrombosis) (HCC) 05/2011   Hepatitis    Pulmonary embolism (HCC) 05/2011   sent home on Xarelto.    Past Surgical History:  Procedure Laterality Date   ANKLE SURGERY     ESOPHAGOGASTRODUODENOSCOPY  3.12.2013   Dr Braulio Conte in [place].  Irregular GE Jx, biopsied benign, naso/SB feeding tube inserted    LIVER BIOPSY  06/2011   transjugular   PARACENTESIS     several during admission at [facility] 2/26- 3/18   Family History: History reviewed. No pertinent family history.  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    He is functionally illiterate, reading skills are poor.    Social Determinants of Health   Financial Resource Strain: Low Risk  (04/12/2022)   Received from [facility], [facility]   Overall Financial Resource Strain (CARDIA)    Difficulty of Paying Living Expenses: Not very hard  Food Insecurity: High Risk (10/16/2022)   Received from [facility]   Food vital sign    Within the past 12 months, you worried that your food would run out before you got money to buy more: Often true    Within the past 12 months, the food  you bought just didn't last and you didn't have money to get more. : Often true  Transportation Needs: Not on file (10/16/2022)  Recent Concern: Transportation Needs - Unmet Transportation Needs (10/16/2022)   Received from [employer]    In the past 12 months, has lack of reliable transportation kept you from medical appointments, meetings, work or from getting things needed for daily living? : Yes  Physical Activity: Not on file  Stress: No Stress Concern Present (01/25/2021)   Received from [facility], [facility] of Occupational Health - Occupational Stress Questionnaire    Feeling of Stress : Not at all  Social Connections: Unknown (08/21/2021)   Received from [facility], [facility]   Social Network    Social Network: Not on file    Sleep: Good  Appetite:  Good  Current Medications: Current Facility-Administered Medications  Medication Dose Route Frequency Provider Last Rate Last Admin   ARIPiprazole (ABILIFY) tablet 5 mg  5 mg Oral Daily Onuoha, Josephine C, NP   5 mg at 10/25/22 0946   gabapentin (NEURONTIN) capsule 300 mg  300 mg Oral TID Dahlia Byes C, NP   300 mg at 10/25/22 1726   ibuprofen (ADVIL) tablet 800 mg  800 mg Oral Q6H PRN Charlynne Pander, MD   800 mg at 10/22/22 2114   insulin aspart (novoLOG) injection 0-15 Units  0-15 Units Subcutaneous TID WC Kommor, Madison, MD   2 Units at 10/25/22 1655   insulin aspart (novoLOG) injection 0-5 Units  0-5 Units Subcutaneous QHS Kommor, Wyn Forster, MD   3 Units at 10/24/22 2137   insulin glargine-yfgn (SEMGLEE) injection 12 Units  12 Units Subcutaneous Daily Lorre Nick, MD   12 Units at 10/25/22 1609   metFORMIN (GLUCOPHAGE-XR) 24 hr tablet 750 mg  750 mg Oral BID WC Charlynne Pander, MD   750 mg at 10/25/22 1654   sertraline (ZOLOFT) tablet 100 mg  100 mg Oral Daily Charlynne Pander, MD   100 mg at 10/25/22 0947   traZODone (DESYREL) tablet 50 mg  50 mg Oral QHS  Charlynne Pander, MD   50 mg at 10/24/22 2112   Current Outpatient Medications  Medication Sig Dispense Refill   HUMALOG KWIKPEN 100 UNIT/ML KwikPen Inject 2-8 Units into the skin See admin instructions. Inject 2-8 units into the skin four times a day, per sliding scale: BGL 140-180 = 2 units; 181-240 = 4 units; 241-300 = 6 units; 301-350 = 8 units     LANTUS SOLOSTAR 100 UNIT/ML Solostar Pen Inject 12 Units into the skin 2 (two) times daily.     ARIPiprazole (ABILIFY) 10 MG tablet Take 10 mg by mouth daily.     gabapentin (NEURONTIN) 600 MG tablet Take 600 mg by mouth 4 (  four) times daily.     metFORMIN (GLUCOPHAGE-XR) 750 MG 24 hr tablet Take 1 tablet (750 mg total) by mouth 2 (two) times daily with a meal. 60 tablet 0   sertraline (ZOLOFT) 100 MG tablet Take 1 tablet (100 mg total) by mouth daily. 30 tablet 0   traZODone (DESYREL) 50 MG tablet Take 50 mg by mouth at bedtime.      Lab Results:  Results for orders placed or performed during the hospital encounter of 10/19/22 (from the past 48 hour(s))  CBG monitoring, ED     Status: Abnormal   Collection Time: 10/23/22  8:59 PM  Result Value Ref Range   Glucose-Capillary 246 (H) 70 - 99 mg/dL    Comment: Glucose reference range applies only to samples taken after fasting for at least 8 hours.  CBG monitoring, ED     Status: Abnormal   Collection Time: 10/24/22  7:49 AM  Result Value Ref Range   Glucose-Capillary 308 (H) 70 - 99 mg/dL    Comment: Glucose reference range applies only to samples taken after fasting for at least 8 hours.  CBG monitoring, ED     Status: Abnormal   Collection Time: 10/24/22 11:14 AM  Result Value Ref Range   Glucose-Capillary 294 (H) 70 - 99 mg/dL    Comment: Glucose reference range applies only to samples taken after fasting for at least 8 hours.  CBG monitoring, ED     Status: Abnormal   Collection Time: 10/24/22  4:51 PM  Result Value Ref Range   Glucose-Capillary 126 (H) 70 - 99 mg/dL    Comment:  Glucose reference range applies only to samples taken after fasting for at least 8 hours.  CBG monitoring, ED     Status: Abnormal   Collection Time: 10/24/22  9:24 PM  Result Value Ref Range   Glucose-Capillary 262 (H) 70 - 99 mg/dL    Comment: Glucose reference range applies only to samples taken after fasting for at least 8 hours.  CBG monitoring, ED     Status: Abnormal   Collection Time: 10/25/22  7:30 AM  Result Value Ref Range   Glucose-Capillary 297 (H) 70 - 99 mg/dL    Comment: Glucose reference range applies only to samples taken after fasting for at least 8 hours.   Comment 1 Notify RN   CBG monitoring, ED     Status: Abnormal   Collection Time: 10/25/22 12:04 PM  Result Value Ref Range   Glucose-Capillary 376 (H) 70 - 99 mg/dL    Comment: Glucose reference range applies only to samples taken after fasting for at least 8 hours.   Comment 1 Notify RN   CBG monitoring, ED     Status: Abnormal   Collection Time: 10/25/22  4:32 PM  Result Value Ref Range   Glucose-Capillary 128 (H) 70 - 99 mg/dL    Comment: Glucose reference range applies only to samples taken after fasting for at least 8 hours.   Comment 1 Notify RN   CBG monitoring, ED     Status: Abnormal   Collection Time: 10/25/22  5:43 PM  Result Value Ref Range   Glucose-Capillary 150 (H) 70 - 99 mg/dL    Comment: Glucose reference range applies only to samples taken after fasting for at least 8 hours.   Comment 1 Notify RN     Blood Alcohol level:  Lab Results  Component Value Date   ETH <10 10/19/2022    Physical Findings:  CIWA:    COWS:     Musculoskeletal:  Patient observed resting in bed.  Psychiatric Specialty Exam:  Presentation  General Appearance:  Disheveled  Eye Contact: Fleeting  Speech: Garbled  Speech Volume: Decreased  Handedness: Right   Mood and Affect  Mood: Depressed; Hopeless  Affect: Constricted; Flat   Thought Process  Thought  Processes: Coherent  Descriptions of Associations:Intact  Orientation:Partial  Thought Content:WDL  History of Schizophrenia/Schizoaffective disorder:No  Duration of Psychotic Symptoms:No data recorded Hallucinations:Hallucinations: None  Ideas of Reference:None  Suicidal Thoughts:Suicidal Thoughts: Yes, Active SI Active Intent and/or Plan: With Plan  Homicidal Thoughts:Homicidal Thoughts: No   Sensorium  Memory: Immediate Fair; Recent Fair  Judgment: Fair  Insight: Fair   Chartered certified accountant: Fair  Attention Span: Fair  Recall: [employer] of Knowledge: Fair  Language: Fair   Psychomotor Activity  Psychomotor Activity: Psychomotor Activity: Normal   Assets  Assets: Manufacturing systems engineer; Desire for Improvement; Social Support   Sleep  Sleep: Sleep: Fair    Physical Exam: Physical Exam Vitals and nursing note reviewed. Exam conducted with a chaperone present.  Neurological:     Mental Status: He is alert.  Psychiatric:        Attention and Perception: Attention normal.        Mood and Affect: Mood normal. Affect is flat.        Speech: Speech normal.        Behavior: Behavior is cooperative.        Thought Content: Thought content includes suicidal ideation.        Cognition and Memory: Memory normal.    Review of Systems  Constitutional: Negative.   Psychiatric/Behavioral:  Positive for depression.    Blood pressure 117/79, pulse 88, temperature 98.2 F (36.8 C), temperature source Oral, resp. rate 18, height 6' (1.829 m), weight 54.4 kg, SpO2 99%. Body mass index is 16.27 kg/m.   Medical Decision Making: Continue seeking inpatient Psychiatry hospitalization.  Patient being reviewed by [facility].   Quindell Shere MOTLEY-MANGRUM, PMHNP 10/25/2022, 5:52 PM

## 2022-10-25 NOTE — ED Provider Notes (Signed)
I was called to bedside when the patient started feeling generally unwell.  He notified staff that he felt like his blood sugar had dropped.  Repeat CBG shows it is 150.  They mention that he appeared pale.  On my arrival he is sitting up, conversational.  No diaphoresis, active chest pain, shortness of breath or signs of acute illness.  States that he is feeling improved.  Has been eating and drinking without difficulty.  EKG is unchanged for the patient.  CBC and BMP showed no acute changes.  On reevaluation he feels back to baseline has no complaints.  Vitals are normal and stable.   Rozelle Logan, DO 10/25/22 1930

## 2022-10-26 ENCOUNTER — Inpatient Hospital Stay (HOSPITAL_COMMUNITY)
Admission: AD | Admit: 2022-10-26 | Discharge: 2022-10-31 | DRG: 885 | Payer: MEDICAID | Source: Intra-hospital | Attending: Emergency Medicine | Admitting: Emergency Medicine

## 2022-10-26 ENCOUNTER — Encounter (HOSPITAL_COMMUNITY): Payer: Self-pay | Admitting: Nurse Practitioner

## 2022-10-26 ENCOUNTER — Other Ambulatory Visit: Payer: Self-pay

## 2022-10-26 DIAGNOSIS — Z5902 Unsheltered homelessness: Secondary | ICD-10-CM

## 2022-10-26 DIAGNOSIS — R45851 Suicidal ideations: Secondary | ICD-10-CM | POA: Diagnosis present

## 2022-10-26 DIAGNOSIS — Z79899 Other long term (current) drug therapy: Secondary | ICD-10-CM

## 2022-10-26 DIAGNOSIS — F322 Major depressive disorder, single episode, severe without psychotic features: Secondary | ICD-10-CM | POA: Diagnosis not present

## 2022-10-26 DIAGNOSIS — B182 Chronic viral hepatitis C: Principal | ICD-10-CM

## 2022-10-26 DIAGNOSIS — K7460 Unspecified cirrhosis of liver: Secondary | ICD-10-CM | POA: Diagnosis present

## 2022-10-26 DIAGNOSIS — K861 Other chronic pancreatitis: Secondary | ICD-10-CM | POA: Diagnosis present

## 2022-10-26 DIAGNOSIS — G4700 Insomnia, unspecified: Secondary | ICD-10-CM | POA: Diagnosis present

## 2022-10-26 DIAGNOSIS — Z794 Long term (current) use of insulin: Secondary | ICD-10-CM | POA: Diagnosis not present

## 2022-10-26 DIAGNOSIS — F1510 Other stimulant abuse, uncomplicated: Secondary | ICD-10-CM | POA: Diagnosis present

## 2022-10-26 DIAGNOSIS — F4010 Social phobia, unspecified: Secondary | ICD-10-CM | POA: Diagnosis present

## 2022-10-26 DIAGNOSIS — S91309A Unspecified open wound, unspecified foot, initial encounter: Secondary | ICD-10-CM | POA: Diagnosis present

## 2022-10-26 DIAGNOSIS — F332 Major depressive disorder, recurrent severe without psychotic features: Secondary | ICD-10-CM | POA: Diagnosis not present

## 2022-10-26 DIAGNOSIS — E1165 Type 2 diabetes mellitus with hyperglycemia: Secondary | ICD-10-CM | POA: Diagnosis present

## 2022-10-26 DIAGNOSIS — L97329 Non-pressure chronic ulcer of left ankle with unspecified severity: Secondary | ICD-10-CM | POA: Diagnosis not present

## 2022-10-26 DIAGNOSIS — R197 Diarrhea, unspecified: Secondary | ICD-10-CM | POA: Diagnosis not present

## 2022-10-26 DIAGNOSIS — Z9151 Personal history of suicidal behavior: Secondary | ICD-10-CM | POA: Diagnosis not present

## 2022-10-26 DIAGNOSIS — F17210 Nicotine dependence, cigarettes, uncomplicated: Secondary | ICD-10-CM | POA: Diagnosis not present

## 2022-10-26 DIAGNOSIS — Z597 Insufficient social insurance and welfare support: Secondary | ICD-10-CM | POA: Diagnosis not present

## 2022-10-26 DIAGNOSIS — F1210 Cannabis abuse, uncomplicated: Secondary | ICD-10-CM | POA: Diagnosis present

## 2022-10-26 DIAGNOSIS — Z86711 Personal history of pulmonary embolism: Secondary | ICD-10-CM

## 2022-10-26 DIAGNOSIS — R64 Cachexia: Secondary | ICD-10-CM | POA: Diagnosis present

## 2022-10-26 DIAGNOSIS — Z560 Unemployment, unspecified: Secondary | ICD-10-CM | POA: Diagnosis not present

## 2022-10-26 DIAGNOSIS — Z7984 Long term (current) use of oral hypoglycemic drugs: Secondary | ICD-10-CM | POA: Diagnosis not present

## 2022-10-26 DIAGNOSIS — F1410 Cocaine abuse, uncomplicated: Secondary | ICD-10-CM | POA: Diagnosis present

## 2022-10-26 DIAGNOSIS — S91002A Unspecified open wound, left ankle, initial encounter: Secondary | ICD-10-CM | POA: Diagnosis not present

## 2022-10-26 DIAGNOSIS — Z86718 Personal history of other venous thrombosis and embolism: Secondary | ICD-10-CM

## 2022-10-26 DIAGNOSIS — I5022 Chronic systolic (congestive) heart failure: Secondary | ICD-10-CM | POA: Diagnosis not present

## 2022-10-26 LAB — GLUCOSE, CAPILLARY: Glucose-Capillary: 289 mg/dL — ABNORMAL HIGH (ref 70–99)

## 2022-10-26 LAB — CBG MONITORING, ED
Glucose-Capillary: 346 mg/dL — ABNORMAL HIGH (ref 70–99)
Glucose-Capillary: 197 mg/dL — ABNORMAL HIGH (ref 70–99)
Glucose-Capillary: 401 mg/dL — ABNORMAL HIGH (ref 70–99)

## 2022-10-26 MED ORDER — LORAZEPAM 1 MG PO TABS: 2.0000 mg | ORAL_TABLET | Freq: Three times a day (TID) | ORAL | Status: DC | PRN

## 2022-10-26 MED ORDER — DIPHENHYDRAMINE HCL 25 MG PO CAPS: 50.0000 mg | ORAL_CAPSULE | Freq: Three times a day (TID) | ORAL | Status: DC | PRN

## 2022-10-26 MED ORDER — ALUM & MAG HYDROXIDE-SIMETH 200-200-20 MG/5ML PO SUSP: 30.0000 mL | ORAL | Status: DC | PRN

## 2022-10-26 MED ORDER — METFORMIN HCL ER 750 MG PO TB24
750.0000 mg | ORAL_TABLET | Freq: Two times a day (BID) | ORAL | Status: DC
  Administered 2022-10-27 – 2022-10-31 (×9): 750 mg via ORAL
  Filled 2022-10-26 (×13): qty 1

## 2022-10-26 MED ORDER — TRAZODONE HCL 50 MG PO TABS
50.0000 mg | ORAL_TABLET | Freq: Every day | ORAL | Status: DC
  Administered 2022-10-26 – 2022-10-30 (×5): 50 mg via ORAL
  Filled 2022-10-26 (×9): qty 1

## 2022-10-26 MED ORDER — DIPHENHYDRAMINE HCL 50 MG/ML IJ SOLN: 50.0000 mg | Freq: Three times a day (TID) | INTRAMUSCULAR | Status: DC | PRN

## 2022-10-26 MED ORDER — INSULIN ASPART 100 UNIT/ML IJ SOLN
0.0000 [IU] | Freq: Every day | INTRAMUSCULAR | Status: DC
  Administered 2022-10-26: 3 [IU] via SUBCUTANEOUS
  Administered 2022-10-27: 2 [IU] via SUBCUTANEOUS
  Administered 2022-10-29: 3 [IU] via SUBCUTANEOUS
  Administered 2022-10-30: 2 [IU] via SUBCUTANEOUS
  Filled 2022-10-26: qty 0.05

## 2022-10-26 MED ORDER — GABAPENTIN 300 MG PO CAPS
300.0000 mg | ORAL_CAPSULE | Freq: Three times a day (TID) | ORAL | Status: DC
  Administered 2022-10-27 (×2): 300 mg via ORAL
  Filled 2022-10-26 (×7): qty 1

## 2022-10-26 MED ORDER — HALOPERIDOL 5 MG PO TABS: 5.0000 mg | ORAL_TABLET | Freq: Three times a day (TID) | ORAL | Status: DC | PRN

## 2022-10-26 MED ORDER — INSULIN ASPART 100 UNIT/ML IJ SOLN
0.0000 [IU] | Freq: Three times a day (TID) | INTRAMUSCULAR | Status: DC
  Administered 2022-10-27 (×2): 15 [IU] via SUBCUTANEOUS
  Administered 2022-10-27: 11 [IU] via SUBCUTANEOUS
  Administered 2022-10-28 (×2): 15 [IU] via SUBCUTANEOUS
  Administered 2022-10-28: 8 [IU] via SUBCUTANEOUS
  Administered 2022-10-29: 11 [IU] via SUBCUTANEOUS
  Administered 2022-10-29: 8 [IU] via SUBCUTANEOUS
  Administered 2022-10-29: 5 [IU] via SUBCUTANEOUS
  Administered 2022-10-30: 11 [IU] via SUBCUTANEOUS
  Administered 2022-10-30 – 2022-10-31 (×2): 8 [IU] via SUBCUTANEOUS
  Filled 2022-10-26: qty 0.15

## 2022-10-26 MED ORDER — SERTRALINE HCL 100 MG PO TABS
100.0000 mg | ORAL_TABLET | Freq: Every day | ORAL | Status: DC
  Administered 2022-10-27 – 2022-10-31 (×5): 100 mg via ORAL
  Filled 2022-10-26 (×7): qty 1

## 2022-10-26 MED ORDER — ENSURE ENLIVE PO LIQD
237.0000 mL | Freq: Two times a day (BID) | ORAL | Status: DC
  Administered 2022-10-29 – 2022-10-30 (×3): 237 mL via ORAL
  Filled 2022-10-26 (×13): qty 237

## 2022-10-26 MED ORDER — IBUPROFEN 800 MG PO TABS
800.0000 mg | ORAL_TABLET | Freq: Four times a day (QID) | ORAL | Status: DC | PRN
  Administered 2022-10-27 – 2022-10-30 (×3): 800 mg via ORAL
  Filled 2022-10-26 (×3): qty 1

## 2022-10-26 MED ORDER — HALOPERIDOL LACTATE 5 MG/ML IJ SOLN: 5.0000 mg | Freq: Three times a day (TID) | INTRAMUSCULAR | Status: DC | PRN

## 2022-10-26 MED ORDER — LORAZEPAM 2 MG/ML IJ SOLN: 2.0000 mg | Freq: Three times a day (TID) | INTRAMUSCULAR | Status: DC | PRN

## 2022-10-26 MED ORDER — MAGNESIUM HYDROXIDE 400 MG/5ML PO SUSP: 30.0000 mL | Freq: Every day | ORAL | Status: DC | PRN

## 2022-10-26 MED ORDER — INSULIN GLARGINE-YFGN 100 UNIT/ML ~~LOC~~ SOLN
12.0000 [IU] | Freq: Every day | SUBCUTANEOUS | Status: DC
  Administered 2022-10-27 – 2022-10-28 (×2): 12 [IU] via SUBCUTANEOUS

## 2022-10-26 MED ORDER — ARIPIPRAZOLE 5 MG PO TABS
5.0000 mg | ORAL_TABLET | Freq: Every day | ORAL | Status: DC
  Administered 2022-10-27 – 2022-10-31 (×5): 5 mg via ORAL
  Filled 2022-10-26 (×7): qty 1

## 2022-10-26 NOTE — [facility] Group Notes (Signed)
[facility] Group Notes:  (Nursing/MHT/Case Management/Adjunct)  Date:  10/26/2022  Time:  9:21 PM  Type of Therapy:   Wrap-up group  Participation Level:  Did Not Attend  Participation Quality:    Affect:    Cognitive:    Insight:    Engagement in Group:    Modes of Intervention:    Summary of Progress/Problems: Didn't attend.   Antonio [name] 10/26/2022, 9:21 PM

## 2022-10-26 NOTE — Progress Notes (Signed)
B[facility]/BMU LCSW Progress Note   10/26/2022    4:49 PM  Kyzen Horn   [id number]   Type of Contact and Topic:  Psychiatric Bed Placement   Pt accepted to [facility] 303-2    Patient meets inpatient criteria per Dahlia Byes, NP  The attending provider will be Dr. Sherron Flemings  Call report to [phone]    Ilsa Iha, RN @ [facility] ED notified.     Pt scheduled  to arrive at [facility] TODAY AFTER 8 PM.    Damita Dunnings, MSW, LCSW-A  4:50 PM 10/26/2022

## 2022-10-26 NOTE — ED Notes (Signed)
Patient has been accepted to [facility] 303-2 to the services of Dr. Sherron Flemings and may arrive anytime after 8 pm tonight

## 2022-10-26 NOTE — ED Provider Notes (Signed)
Emergency Medicine Observation Re-evaluation Note  Antonio [name] is a 52 y.o. male, seen on rounds today.  Pt initially presented to the ED for complaints of Loss of Consciousness and Suicidal Currently, the patient is sleeping.  No acute distress.  Physical Exam  BP 104/79 (BP Location: Left Arm)   Pulse 96   Temp 98.1 F (36.7 C) (Oral)   Resp 18   Ht 1.829 m (6')   Wt 54.4 kg   SpO2 98%   BMI 16.27 kg/m  Physical Exam  ED Course / MDM  EKG:EKG Interpretation Date/Time:  Saturday October 19 2022 18:19:54 EDT Ventricular Rate:  92 PR Interval:  130 QRS Duration:  80 QT Interval:  380 QTC Calculation: 471 R Axis:   85  Text Interpretation: Sinus rhythm Nonspecific T abnrm, anterolateral leads No significant change since last tracing Confirmed by Richardean Canal [phone]) on 10/19/2022 6:37:12 PM  I have reviewed the labs performed to date as well as medications administered while in observation.  Recent changes in the last 24 hours include blood sugars have been reviewed and appear to be stabilizing somewhat..  Plan  Current plan is for placement.    Lorre Nick, MD 10/26/22 [phone]

## 2022-10-26 NOTE — ED Notes (Signed)
Report received from Jonny Ruiz, [place].  Patient has been accepted to [facility] and can be transported after 8pm by [employer].  Belongings and paperwork at nurses's station and report given to [facility] per Jonny Ruiz, Charity fundraiser.

## 2022-10-27 LAB — GLUCOSE, CAPILLARY
Glucose-Capillary: 350 mg/dL — ABNORMAL HIGH (ref 70–99)
Glucose-Capillary: 371 mg/dL — ABNORMAL HIGH (ref 70–99)
Glucose-Capillary: 382 mg/dL — ABNORMAL HIGH (ref 70–99)
Glucose-Capillary: 227 mg/dL — ABNORMAL HIGH (ref 70–99)

## 2022-10-27 MED ORDER — NICOTINE POLACRILEX 2 MG MT GUM: 2.0000 mg | CHEWING_GUM | OROMUCOSAL | Status: DC | PRN

## 2022-10-27 MED ORDER — ALUM & MAG HYDROXIDE-SIMETH 200-200-20 MG/5ML PO SUSP: 30.0000 mL | ORAL | Status: DC | PRN

## 2022-10-27 MED ORDER — GABAPENTIN 400 MG PO CAPS
400.0000 mg | ORAL_CAPSULE | Freq: Three times a day (TID) | ORAL | Status: DC
  Administered 2022-10-28 – 2022-10-31 (×10): 400 mg via ORAL
  Filled 2022-10-27 (×16): qty 1

## 2022-10-27 MED ORDER — ONDANSETRON HCL 4 MG PO TABS: 8.0000 mg | ORAL_TABLET | Freq: Three times a day (TID) | ORAL | Status: DC | PRN

## 2022-10-27 MED ORDER — BISMUTH SUBSALICYLATE 262 MG PO CHEW: 524.0000 mg | CHEWABLE_TABLET | ORAL | Status: DC | PRN

## 2022-10-27 MED ORDER — HYDROXYZINE HCL 25 MG PO TABS
25.0000 mg | ORAL_TABLET | Freq: Three times a day (TID) | ORAL | Status: DC | PRN
  Administered 2022-10-28: 25 mg via ORAL
  Filled 2022-10-27: qty 1

## 2022-10-27 MED ORDER — NICOTINE 14 MG/24HR TD PT24: 14.0000 mg | MEDICATED_PATCH | Freq: Every day | TRANSDERMAL | Status: DC | PRN

## 2022-10-27 MED ORDER — POLYETHYLENE GLYCOL 3350 17 G PO PACK: 17.0000 g | PACK | Freq: Every day | ORAL | Status: DC | PRN

## 2022-10-27 MED ORDER — SENNA 8.6 MG PO TABS: 1.0000 | ORAL_TABLET | Freq: Every evening | ORAL | Status: DC | PRN

## 2022-10-27 MED ORDER — GABAPENTIN 300 MG PO CAPS
600.0000 mg | ORAL_CAPSULE | Freq: Two times a day (BID) | ORAL | Status: DC
  Filled 2022-10-27: qty 2

## 2022-10-27 NOTE — Progress Notes (Signed)
Patient refused EKG.

## 2022-10-27 NOTE — Progress Notes (Signed)
   10/27/22 0900  Psych Admission Type (Psych Patients Only)  Admission Status Voluntary  Psychosocial Assessment  Patient Complaints Anxiety;Depression  Eye Contact Fair  Facial Expression Sad  Affect Depressed;Flat  Speech Logical/coherent  Interaction Guarded  Motor Activity Slow  Appearance/Hygiene Disheveled  Behavior Characteristics Cooperative  Mood Depressed  Thought Process  Coherency WDL  Content WDL  Delusions None reported or observed  Perception WDL  Hallucination None reported or observed  Judgment Poor  Confusion None  Danger to Self  Current suicidal ideation? Denies  Self-Injurious Behavior No self-injurious ideation or behavior indicators observed or expressed   Agreement Not to Harm Self Yes  Description of Agreement verbal  Danger to Others  Danger to Others None reported or observed

## 2022-10-27 NOTE — Tx Team (Signed)
Initial Treatment Plan 10/27/2022 12:11 AM [name] [id number]    PATIENT STRESSORS: Financial difficulties   Health problems   Medication change or noncompliance   Occupational concerns     PATIENT STRENGTHS: Average or above average intelligence  Motivation for treatment/growth  Physical Health    PATIENT IDENTIFIED PROBLEMS: Depression  Suicidal ideation  Type II DM  "People arguing, all kind of things"  "Get better"  "I don't know, I'm just here"            DISCHARGE CRITERIA:  Ability to meet basic life and health needs Adequate post-discharge living arrangements Motivation to continue treatment in a less acute level of care Safe-care adequate arrangements made Verbal commitment to aftercare and medication compliance  PRELIMINARY DISCHARGE PLAN: Attend aftercare/continuing care group Outpatient therapy Placement in alternative living arrangements  PATIENT/FAMILY INVOLVEMENT: This treatment plan has been presented to and reviewed with the patient, Antonio [name], and/or family member.  The patient and family have been given the opportunity to ask questions and make suggestions.  Wendie Simmer, RN 10/27/2022, 12:11 AM

## 2022-10-27 NOTE — B[facility] Group Notes (Signed)
[facility] Group Notes:  (Nursing/MHT/Case Management/Adjunct)  Date:  10/27/2022  Time:  1:31 PM  Type of Therapy:  Psychoeducational Skills  Participation Level:  Active  Participation Quality:  Appropriate  Affect:  Appropriate  Cognitive:  Appropriate  Insight:  Appropriate  Engagement in Group:  Engaged  Modes of Intervention:  Discussion, Education, and Exploration  Summary of Progress/Problems: Patients were given education on how negative thinking can impact the brain, our mental health. Pt were then asked to practice positive affirmations. Pt attended and was appropriate.  Antonio [name] 10/27/2022, 1:31 PM

## 2022-10-27 NOTE — Progress Notes (Signed)
Patient refused EKG. Will attempt again this shift.

## 2022-10-27 NOTE — Plan of Care (Signed)
  Problem: Education: Goal: Knowledge of [facility] Education information/materials will improve Outcome: Progressing   Problem: Education: Goal: Emotional status will improve Outcome: Progressing   Problem: Education: Goal: Verbalization of understanding the information provided will improve Outcome: Progressing   Problem: Health Behavior/Discharge Planning: Goal: Compliance with treatment plan for underlying cause of condition will improve Outcome: Progressing   Problem: Physical Regulation: Goal: Ability to maintain clinical measurements within normal limits will improve Outcome: Progressing   Problem: Safety: Goal: Periods of time without injury will increase Outcome: Progressing

## 2022-10-27 NOTE — BHH Group Notes (Signed)
B[facility] Group Notes:  (Nursing/MHT/Case Management/Adjunct)  Date:  10/27/2022  Time:  8:52 PM  Type of Therapy:   Wrap-up group  Participation Level:  Did Not Attend  Participation Quality:    Affect:    Cognitive:    Insight:    Engagement in Group:    Modes of Intervention:    Summary of Progress/Problems: Pt refused to attend group.  Antonio [name] 10/27/2022, 8:52 PM

## 2022-10-27 NOTE — Progress Notes (Signed)
   10/27/22 0626  15 Minute Checks  Location Dayroom  Visual Appearance Calm  Behavior Composed  Sleep (Behavioral Health Patients Only)  Calculate sleep? (Click Yes once per 24 hr at 0600 safety check) Yes  Documented sleep last 24 hours 8.25

## 2022-10-27 NOTE — B[facility] Group Notes (Signed)
[facility] Group Notes:  (Nursing/MHT/Case Management/Adjunct)  Date:  10/27/2022  Time:  1:12 PM  Type of Therapy:  Psychoeducational Skills  Participation Level:  Active  Participation Quality:  Appropriate  Affect:  Appropriate  Cognitive:  Appropriate  Insight:  Appropriate  Engagement in Group:  Engaged  Modes of Intervention:  Discussion, Education, and Exploration  Summary of Progress/Problems: Patient attended group in which a podcast was played by Berniece Pap from [employer] podcast discussing mental health and wellness. Tips and tricks on identifying how to support mental wellbeing. Pt attended and was appropriate.  Malva Limes 10/27/2022, 1:12 PM

## 2022-10-27 NOTE — Hospital Course (Signed)
Reason for admission: suicidal ideations in setting of worsening depression + falling out with brother Psychiatric diagnoses: MDD, severe, SAD, stimulant+cannabis+tobacco use disorders Psychotropic medications: aripiprazole 5 mg daily, gabapentin 400 mg TID, sertraline 100 mg daily, trazodone 50 mg qhs Medical diagnoses: throat cancer, poorly controlled DM, liver issues Hospital course: admitted 7/21. Restarted home meds -still very depressed and suicidal Disposition: patient is homeless, likely back to street  To do: reassess depression, can consider increasing sertraline. F/u infectious disease labs and diabetes coordinator recs

## 2022-10-27 NOTE — H&P (Addendum)
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.2],  Major depressive disorder, recurrent severe without psychotic features (HCC)  Principal Problem:   Major depressive disorder, recurrent severe without psychotic features (HCC)   History of Present Illness:  Antonio [name] is a 52 y.o., male with a past psychiatric history of major depressive disorder and social anxiety disorder, substance use history of stimulant use disorder, tobacco use disorder, and tobacco use disorder, and significant PMHx of uncontrolled diabetes  who presents to the [facility] Voluntary from  Emergency Department  for evaluation and management of suicidal ideations.   Patient says 1 week ago he had a falling out with his brother. I could not quite make out what patient was saying since he had a difficult time speaking because of what he says is throat cancer. He recounts that his brother said he was done with him (patient) and since then he has been more and more depressed. Lately he has been thinking about going into traffic or jumping off a bridge to end his life. He has not made any attempts in recent time.  Patient endorses in the past, over a 2 week period, and 2 weeks prior to this encounter, experiencing loss of interest in pleasurable activities ([place]), feeling worthless or guilty, feeling fatigued or lacking energy, not able to concentrate or being more indecisive than usual, and having thoughts of death (jumping off bridge, running into traffic). Patient was not consuming but had recently stopped consuming substances (stopped methamphetamines and cannabis 3 weeks ago) during this time. Patient denies ever in the past, for at least 4 consecutive days, a period of feeling the complete opposite of  depressed or feeling irritable accompanied by increased energy or increased activity.  He endorses feeling anxious around people and reports a past psychiatric diagnosis of social anxiety. He denies a history of panic attacks.  He denies any history of trauma. He does endorse feeling like "the whole town is out to get me" whenever he is alone in the street at night.  Patient reports taking all his psychiatric medications and his insulin consistently daily.  He endorses IV drug use of methamphetamines. He points to his pelvic area and says he has (or had) an infection there, as well as "two blood infections."  When asked about contact persons, patient says, "I have no one." He declines me speaking to his brother because they had a recent falling-out. Both he and his brother are homeless.  Chart review: On chart review, prior to this evaluation, patient was seen in the emergency department after a witnessed near syncopal episode while he was at a bus stop. He was endorsing suicidal ideations in the emergency department  Subjective Sleep past 24 hours: good Subjective Appetite past 24 hours: good  Past Psychiatric History:  Previous psych diagnoses:  major depressive disorder, social anxiety disorder Prior inpatient psychiatric treatment:  about 4 times throughout various hospitals for depression Prior outpatient psychiatric treatment:  denies Current psychiatric provider: Denies  Current therapist: Denies Psychotherapy hx: Denies  History of suicide attempts:  twice in past tried to hang himself History of homicide: Denies  Psychotropic medications: Current Gabapentin 600 mg twice a day - patient reportedly taking consistently, reports good response Sertraline 100 mg daily - patient reportedly taking consistently, reports good response Trazodone 10 mg daily at  bedtime ("the white pill") - patient reportedly taking consistently, reports good response Aripiprazole daily, cannot  remember dose - patient reportedly taking consistently, reports good response  Past Denies other medication trials  Allergies: endorses the following medication allergies with resulting symptoms: acetaminophen  Substance Use History: Alcohol: sober for 20 years, 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   --------  Tobacco: endorses, current, smokes 1 packs per day since age 65 Cannabis (marijuana): endorses smoking, last time 3 weeks ago Cocaine: tried in past and has since quit, no current use Methamphetamines:  endorses using, last time 3 weeks ago Psilocybin (mushrooms): never tried Ecstasy (MDMA / molly): never tried Opiates (fentanyl / heroin): tried in past, no current use Benzos (Xanax, Klonopin): never tried IV drug use: endorses ("it's how I use meth") Prescribed meds abuse: denies  History of detox: attempted detox from methamphetamines and cannabis with good results History of rehab: denies  Is the patient at risk to self? Yes Has the patient been a risk to self in the past 6 months? Unknown Has the patient been a risk to self within the distant past? Yes Is the patient a risk to others? No Has the patient been a risk to others in the past 6 months? No Has the patient been a risk to others within the distant past? No  Alcohol Screening: Patient refused Alcohol Screening Tool: Yes 1. How often do you have a drink containing alcohol?: Never 2. How many drinks containing alcohol do you have on a typical day when you are drinking?: 1 or 2 3. How often do you have six or more drinks on one occasion?: Never AUDIT-C Score: 0 4. How often during the last year have you found that you were not able to stop drinking once you had started?: Never 5. How often during the last year have you failed to do what was normally expected from you because of drinking?: Never 6. How often during the last year have you needed a first drink in the morning to get yourself going after a heavy drinking session?:  Never 7. How often during the last year have you had a feeling of guilt of remorse after drinking?: Never 8. How often during the last year have you been unable to remember what happened the night before because you had been drinking?: Never 9. Have you or someone else been injured as a result of your drinking?: No 10. Has a relative or friend or a doctor or another health worker been concerned about your drinking or suggested you cut down?: No Alcohol Use Disorder Identification Test Final Score (AUDIT): 0 Alcohol Brief Interventions/Follow-up: Patient Refused Tobacco Screening:    Substance Abuse History in the last 12 months: Yes  Past Medical/Surgical History:  Medical Diagnoses: throat cancer, diabetes, liver issues Home Rx: insulin Prior Hosp: multiple times Prior Surgeries / non-head trauma: ankle surgery  Head trauma: denies LOC: endorses prior to this admission Concussions: denies Seizures: denies  Last menstrual period and contraceptives: N/A  Family History:  Medical: unknown Psych: unknown Suicide: unknown Homicide: unknown Substance use family hx: unknown  Social History:  Place of birth and grew up where: born in [place] Abuse: no history of abuse Marital Status: single Sexual orientation: straight Children: denies Employment: unemployed Housing: unhoused Finances: no reliable source of income Legal: no Special educational needs teacher: never served Consulting civil engineer: denies owning any firearms Pills stockpile: none  Lab Results:  Results for orders placed or performed during the hospital encounter of 10/26/22 (from the past  48 hour(s))  Glucose, capillary     Status: Abnormal   Collection Time: 10/26/22 10:12 PM  Result Value Ref Range   Glucose-Capillary 289 (H) 70 - 99 mg/dL    Comment: Glucose reference range applies only to samples taken after fasting for at least 8 hours.  Glucose, capillary     Status: Abnormal   Collection Time: 10/27/22  6:18 AM  Result  Value Ref Range   Glucose-Capillary 350 (H) 70 - 99 mg/dL    Comment: Glucose reference range applies only to samples taken after fasting for at least 8 hours.  Glucose, capillary     Status: Abnormal   Collection Time: 10/27/22 11:45 AM  Result Value Ref Range   Glucose-Capillary 371 (H) 70 - 99 mg/dL    Comment: Glucose reference range applies only to samples taken after fasting for at least 8 hours.    Blood Alcohol level:  Lab Results  Component Value Date   ETH <10 10/19/2022    Metabolic Disorder Labs:  Lab Results  Component Value Date   HGBA1C 9.9 (H) 10/19/2022   MPG 237 10/19/2022   MPG 306 10/02/2021   No results found for: "PROLACTIN" Lab Results  Component Value Date   CHOL 130 09/15/2020   TRIG 101 09/15/2020   HDL 28 (L) 09/15/2020   CHOLHDL 4.6 09/15/2020   VLDL 20 09/15/2020   LDLCALC 82 09/15/2020   LDLCALC 66 07/10/2011    Current Medications: Current Facility-Administered Medications  Medication Dose Route Frequency Provider Last Rate Last Admin   alum & mag hydroxide-simeth (MAALOX/MYLANTA) 200-200-20 MG/5ML suspension 30 mL  30 mL Oral Q4H PRN Augusto Gamble, MD       ARIPiprazole (ABILIFY) tablet 5 mg  5 mg Oral Daily Dahlia Byes C, NP   5 mg at 10/27/22 1610   bismuth subsalicylate (PEPTO BISMOL) chewable tablet 524 mg  524 mg Oral Q3H PRN Augusto Gamble, MD       diphenhydrAMINE (BENADRYL) capsule 50 mg  50 mg Oral TID PRN Dahlia Byes C, NP       Or   diphenhydrAMINE (BENADRYL) injection 50 mg  50 mg Intramuscular TID PRN Earney Navy, NP       feeding supplement (ENSURE ENLIVE / ENSURE PLUS) liquid 237 mL  237 mL Oral BID BM Bobbitt, Shalon E, NP       [START ON 10/28/2022] gabapentin (NEURONTIN) capsule 400 mg  400 mg Oral TID Augusto Gamble, MD       haloperidol (HALDOL) tablet 5 mg  5 mg Oral TID PRN Dahlia Byes C, NP       Or   haloperidol lactate (HALDOL) injection 5 mg  5 mg Intramuscular TID PRN Earney Navy, NP        hydrOXYzine (ATARAX) tablet 25 mg  25 mg Oral TID PRN Augusto Gamble, MD       ibuprofen (ADVIL) tablet 800 mg  800 mg Oral Q6H PRN Onuoha, Josephine C, NP       insulin aspart (novoLOG) injection 0-15 Units  0-15 Units Subcutaneous TID WC Onuoha, Josephine C, NP   15 Units at 10/27/22 1158   insulin aspart (novoLOG) injection 0-5 Units  0-5 Units Subcutaneous QHS Dahlia Byes C, NP   3 Units at 10/26/22 2240   insulin glargine-yfgn (SEMGLEE) injection 12 Units  12 Units Subcutaneous Daily Dahlia Byes C, NP   12 Units at 10/27/22 0824   LORazepam (ATIVAN) tablet 2 mg  2 mg  Oral TID PRN Earney Navy, NP       Or   LORazepam (ATIVAN) injection 2 mg  2 mg Intramuscular TID PRN Dahlia Byes C, NP       metFORMIN (GLUCOPHAGE-XR) 24 hr tablet 750 mg  750 mg Oral BID WC Onuoha, Josephine C, NP   750 mg at 10/27/22 8295   nicotine (NICODERM CQ - dosed in mg/24 hours) patch 14 mg  14 mg Transdermal Daily PRN Augusto Gamble, MD       nicotine polacrilex (NICORETTE) gum 2 mg  2 mg Oral PRN Augusto Gamble, MD       ondansetron [facility]) tablet 8 mg  8 mg Oral Q8H PRN Augusto Gamble, MD       polyethylene glycol (MIRALAX / GLYCOLAX) packet 17 g  17 g Oral Daily PRN Augusto Gamble, MD       senna (SENOKOT) tablet 8.6 mg  1 tablet Oral QHS PRN Augusto Gamble, MD       sertraline (ZOLOFT) tablet 100 mg  100 mg Oral Daily Onuoha, Josephine C, NP   100 mg at 10/27/22 6213   traZODone (DESYREL) tablet 50 mg  50 mg Oral QHS Dahlia Byes C, NP   50 mg at 10/26/22 2238    PTA Medications: Medications Prior to Admission  Medication Sig Dispense Refill Last Dose   ARIPiprazole (ABILIFY) 10 MG tablet Take 10 mg by mouth daily.      gabapentin (NEURONTIN) 600 MG tablet Take 600 mg by mouth 4 (four) times daily.      HUMALOG KWIKPEN 100 UNIT/ML KwikPen Inject 2-8 Units into the skin See admin instructions. Inject 2-8 units into the skin four times a day, per sliding scale: BGL 140-180 = 2 units; 181-240 = 4  units; 241-300 = 6 units; 301-350 = 8 units      LANTUS SOLOSTAR 100 UNIT/ML Solostar Pen Inject 12 Units into the skin 2 (two) times daily.      metFORMIN (GLUCOPHAGE-XR) 750 MG 24 hr tablet Take 1 tablet (750 mg total) by mouth 2 (two) times daily with a meal. 60 tablet 0    sertraline (ZOLOFT) 100 MG tablet Take 1 tablet (100 mg total) by mouth daily. 30 tablet 0    traZODone (DESYREL) 50 MG tablet Take 50 mg by mouth at bedtime.       Physical Findings: AIMS: No  CIWA:    COWS:     Psychiatric Specialty Exam: General Appearance:  Disheveled   Eye Contact:  Fleeting   Speech:  Garbled   Volume:  Decreased   Mood:  -- ("depressed")   Affect:  Depressed; Flat   Thought Content:  WDL   Suicidal Thoughts: Suicidal Thoughts: Yes, Active SI Passive Intent and/or Plan: With Intent; With Plan   Homicidal Thoughts: Homicidal Thoughts: No   Thought Process:  Coherent; Linear; Goal Directed   Orientation:  Full (Time, Place and Person)     Memory:  Immediate Good; Recent Good; Remote Good   Judgment:  Good   Insight:  Fair   Concentration:  Good   Recall:  Good   Fund of Knowledge:  Good   Language:  Fair   Psychomotor Activity: Psychomotor Activity: Psychomotor Retardation   Assets:  Desire for Improvement; Resilience   Sleep: Sleep: Good     Review of Systems Review of Systems  Constitutional: Negative.   Respiratory: Negative.    Cardiovascular: Negative.   Gastrointestinal: Negative.   Genitourinary: Negative.  Vital signs: Blood pressure 113/81, pulse 94, temperature 98.1 F (36.7 C), temperature source Oral, resp. rate 18, height 6' (1.829 m), weight 56.6 kg, SpO2 97%. Body mass index is 16.93 kg/m. Physical Exam Vitals and nursing note reviewed.  Constitutional:      Appearance: Normal appearance.  HENT:     Head: Normocephalic and atraumatic.  Pulmonary:     Effort: Pulmonary effort is normal.  Musculoskeletal:      Comments: Patient ambulating in wheelchair  Neurological:     General: No focal deficit present.     Mental Status: He is alert.     Assets  Assets:Desire for Improvement; Resilience   Treatment Plan Summary: Daily contact with patient to assess and evaluate symptoms and progress in treatment and medication management  ASSESSMENT: Major depressive disorder, recurrent, severe, r/o substance induced mood disorder Social anxiety disorder Stimulant use disorder Cannabis use disorder Tobacco use disorder Hx alcohol use  Patient is severely depressed in the setting of recent familial strain with his brother. Will benefit from hospitalization given currently suicidal with plan. Unsure if he is really taking all his medications as he claims but will resume prior to admission medications with minimal changes. He reports a vague history of blood and pelvic (?) infections as well as IVDU so will do infectious disease screen.  PLAN: Safety and Monitoring:  -- Voluntary admission to inpatient psychiatric unit for safety, stabilization and treatment  -- Daily contact with patient to assess and evaluate symptoms and progress in treatment  -- Patient's case to be discussed in multi-disciplinary team meeting  -- Observation Level : q15 minute checks  -- Vital signs: q12 hours  -- Precautions: suicide, elopement, and assault  2. Interventions (medications, psychoeducation, etc):   -- change home gabapentin from 600 mg twice a day to 400 mg three times a day for social anxiety, neuropathic pain  -- continue home sertraline 100 mg daily for depression and social anxiety  -- continue home aripiprazole 5 mg daily to augment treatment for depression  -- continue home trazodone 50 mg at bedtime for insomnia  -- Diabetes regimen: insulin, metformin. Diabetes coordinator consulted for recommendations  -- Infectious disease testing for high risk drug use  -- Patient in need of nicotine replacement;  nicotine polacrilex (gum) and nicotine patch 14 mg / 24 hours ordered. Smoking cessation encouraged  PRN medications for symptomatic management:              -- start hydroxyzine 25 mg three times a day as needed for anxiety              -- start bismuth subsalicylate 524 mg oral chewable tablet every 3 hours as needed for diarrhea / loose stools              -- start senna 8.6 mg oral at bedtime and polyethylene glycol 17 g oral daily as needed for mild to moderate constipation              -- start ondansetron 8 mg every 8 hours as needed for nausea or vomiting              -- start aluminum-magnesium hydroxide + simethicone 30 mL every 4 hours as needed for heartburn or indigestion  -- As needed agitation protocol in-place  The risks/benefits/side-effects/alternatives to the above medication were discussed in detail with the patient and time was given for questions. The patient consents to medication trial. FDA black box warnings, if present,  were discussed.  The patient is agreeable with the medication plan, as above. We will monitor the patient's response to pharmacologic treatment, and adjust medications as necessary.  3. Routine and other pertinent labs: EKG monitoring: QTc: 465 (10/25/2022)  Metabolism / endocrine: BMI: Body mass index is 16.93 kg/m. Prolactin: No results found for: "PROLACTIN" Lipid Panel: Lab Results  Component Value Date   CHOL 130 09/15/2020   TRIG 101 09/15/2020   HDL 28 (L) 09/15/2020   CHOLHDL 4.6 09/15/2020   VLDL 20 09/15/2020   LDLCALC 82 09/15/2020   LDLCALC 66 07/10/2011   HbgA1c: Hgb A1c MFr Bld (%)  Date Value  10/19/2022 9.9 (H)   TSH: TSH (uIU/mL)  Date Value  09/15/2020 0.882    Drugs of Abuse     Component Value Date/Time   LABOPIA NONE DETECTED 10/20/2022 0115   COCAINSCRNUR NONE DETECTED 10/20/2022 0115   COCAINSCRNUR NEGATIVE 07/09/2011 1924   LABBENZ NONE DETECTED 10/20/2022 0115   LABBENZ NEGATIVE 07/09/2011 1924    AMPHETMU NONE DETECTED 10/20/2022 0115   THCU NONE DETECTED 10/20/2022 0115   LABBARB NONE DETECTED 10/20/2022 0115     4. Group Therapy:  -- Encouraged patient to participate in unit milieu and in scheduled group therapies   -- Short Term Goals: Ability to identify changes in lifestyle to reduce recurrence of condition, verbalize feelings, identify and develop effective coping behaviors, maintain clinical measurements within normal limits, and identify triggers associated with substance abuse/mental health issues will improve. Improvement in ability to disclose and discuss suicidal ideas, demonstrate self-control, and comply with prescribed medications.  -- Long Term Goals: Improvement in symptoms so as ready for discharge -- Patient is encouraged to participate in group therapy while admitted to the psychiatric unit. -- We will address other chronic and acute stressors, which contributed to the patient's Major depressive disorder, recurrent severe without psychotic features (HCC) in order to reduce the risk of self-harm at discharge.  5. Discharge Planning:   -- Social work and case management to assist with discharge planning and identification of hospital follow-up needs prior to discharge  -- Estimated LOS: 5 days  -- Discharge Concerns: Need to establish a safety plan; Medication compliance and effectiveness  -- Discharge Goals: Return home with outpatient referrals for mental health follow-up including medication management/psychotherapy  I certify that inpatient services furnished can reasonably be expected to improve the patient's condition.    I discussed my assessment, planned testing, and intervention for the patient with Dr. Sherron Flemings who agrees with my formulated course of action.  Signed: Augusto Gamble, MD 10/27/2022, 2:34 PM

## 2022-10-27 NOTE — Progress Notes (Signed)
Admission Note: The patient is a 52 year old male,  voluntarily admitted to the hospital from [facility] ED with diagnosis of MDD, DM and suicidal ideation. Patient ambulatory with cane, slow and unsteady gait. Front wheel walker provided. Patient presents with depressed/sad mood, irritable and  flat affect. During the admission assessment, patient reports passive SI with a plan to jump off a bridge. He denies HI and AVH, and was verbally contract for safety.   Admission documents reviewed and signed by patient. Skin assessment and personal belongings searched without any contrabands noted, and personal items locked up in locker # 4 per hospital policy. Patient noted with left medial ankle wound,  area cleansed with normal saline and covered with bandage. Patient has multiple tattoos on his body. Patient oriented to the unit, staff members and his room. Meal and drinks provided and patient tolerates well. Admission blood sugar was 289, insulin administered per sliding scale without any side effects. Patient resting in his room, in no s/s of distress. Routine safety checks initiated.
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
## 2022-10-27 NOTE — [facility] Suicide Risk Assessment (Signed)
Suicide Risk Assessment  Admission Assessment    [facility] Admission Suicide Risk Assessment  Nursing information obtained from:  Patient Demographic factors:  Male, Low socioeconomic status, Unemployed, Caucasian Current Mental Status:  Suicidal ideation indicated by patient Loss Factors:  Financial problems / change in socioeconomic status Historical Factors:  Prior suicide attempts Risk Reduction Factors:  NA  Total Time spent with patient: 1 hour Principal Problem: Major depressive disorder, recurrent severe without psychotic features (HCC) Diagnosis:  Principal Problem:   Major depressive disorder, recurrent severe without psychotic features (HCC)   Subjective Data: patient has suicidal plan to jump off bridge or walk into traffic  Continued Clinical Symptoms:  Alcohol Use Disorder Identification Test Final Score (AUDIT): 0 The "Alcohol Use Disorders Identification Test", Guidelines for Use in Primary Care, Second Edition.  World Science writer [facility]). Score between 0-7:  no or low risk or alcohol related problems. Score between 8-15:  moderate risk of alcohol related problems. Score between 16-19:  high risk of alcohol related problems. Score 20 or above:  warrants further diagnostic evaluation for alcohol dependence and treatment.  CLINICAL FACTORS:   Alcohol/Substance Abuse/Dependencies More than one psychiatric diagnosis Medical Diagnoses and Treatments/Surgeries  Musculoskeletal: Strength & Muscle Tone: within normal limits Gait & Station: patient is in wheelchair Patient leans: N/A  Psychiatric Specialty Exam  Presentation  General Appearance:  Disheveled  Eye Contact: Fleeting  Speech: Garbled  Speech Volume: Decreased  Handedness: Right   Mood and Affect  Mood: -- ("depressed")  Duration of Depression Symptoms:  Less than two weeks  Affect: Depressed; Flat   Thought Process  Thought Processes: Coherent; Linear; Goal  Directed  Descriptions of Associations: Intact  Orientation: Full (Time, Place and Person)  Thought Content: WDL  History of Schizophrenia/Schizoaffective disorder: No  Duration of Psychotic Symptoms:No data recorded Hallucinations: Hallucinations: None  Ideas of Reference: None  Suicidal Thoughts: Suicidal Thoughts: Yes, Active SI Passive Intent and/or Plan: With Intent; With Plan  Homicidal Thoughts: Homicidal Thoughts: No   Sensorium  Memory: Immediate Good; Recent Good; Remote Good  Judgment: Good  Insight: Fair   Executive Functions  Concentration: Good  Attention Span: Good  Recall: Good  Fund of Knowledge: Good  Language: Fair   Psychomotor Activity  Psychomotor Activity: Psychomotor Activity: Psychomotor Retardation   Assets  Assets: Desire for Improvement; Resilience   Sleep  Sleep: Sleep: Good   Physical Exam: Vitals and nursing note reviewed.  Constitutional:      Appearance: Normal appearance.  HENT:     Head: Normocephalic and atraumatic.  Pulmonary:     Effort: Pulmonary effort is normal.  Musculoskeletal:     Comments: Patient ambulating in wheelchair  Neurological:     General: No focal deficit present.     Mental Status: He is alert.   Review of Systems  Constitutional: Negative.   Respiratory: Negative.    Cardiovascular: Negative.   Gastrointestinal: Negative.   Genitourinary: Negative.   Blood pressure 113/81, pulse 94, temperature 98.1 F (36.7 C), temperature source Oral, resp. rate 18, height 6' (1.829 m), weight 56.6 kg, SpO2 97%. Body mass index is 16.93 kg/m.  COGNITIVE FEATURES THAT CONTRIBUTE TO RISK:  None    SUICIDE RISK:   Moderate:  Frequent suicidal ideation with limited intensity, and duration, some specificity in terms of plans, no associated intent, good self-control, limited dysphoria/symptomatology, some risk factors present, and identifiable protective factors, including  available and accessible social support.  PLAN OF CARE: see H&P for full  plan of care  I certify that inpatient services furnished can reasonably be expected to improve the patient's condition.   Signed: Augusto Gamble, MD 10/27/2022, 2:50 PM

## 2022-10-28 ENCOUNTER — Encounter (HOSPITAL_COMMUNITY): Payer: Self-pay

## 2022-10-28 LAB — BASIC METABOLIC PANEL
Anion gap: 10 (ref 5–15)
BUN: 23 mg/dL — ABNORMAL HIGH (ref 6–20)
CO2: 20 mmol/L — ABNORMAL LOW (ref 22–32)
Calcium: 8.5 mg/dL — ABNORMAL LOW (ref 8.9–10.3)
Chloride: 104 mmol/L (ref 98–111)
Creatinine, Ser: 0.93 mg/dL (ref 0.61–1.24)
GFR, Estimated: >60 mL/min (ref >60)
Glucose, Bld: 495 mg/dL — ABNORMAL HIGH (ref 70–99)
Potassium: 4.2 mmol/L (ref 3.5–5.1)
Sodium: 134 mmol/L — ABNORMAL LOW (ref 135–145)

## 2022-10-28 LAB — GLUCOSE, CAPILLARY
Glucose-Capillary: 447 mg/dL — ABNORMAL HIGH (ref 70–99)
Glucose-Capillary: 427 mg/dL — ABNORMAL HIGH (ref 70–99)
Glucose-Capillary: 464 mg/dL — ABNORMAL HIGH (ref 70–99)
Glucose-Capillary: 294 mg/dL — ABNORMAL HIGH (ref 70–99)
Glucose-Capillary: 288 mg/dL — ABNORMAL HIGH (ref 70–99)
Glucose-Capillary: 379 mg/dL — ABNORMAL HIGH (ref 70–99)
Glucose-Capillary: 173 mg/dL — ABNORMAL HIGH (ref 70–99)

## 2022-10-28 LAB — LIPID PANEL
Cholesterol: 136 mg/dL (ref 0–200)
HDL: 51 mg/dL (ref >40)
LDL Cholesterol: 62 mg/dL (ref 0–99)
Total CHOL/HDL Ratio: 2.7 RATIO
Triglycerides: 113 mg/dL (ref <150)
VLDL: 23 mg/dL (ref 0–40)

## 2022-10-28 LAB — HEPATITIS PANEL, ACUTE
HCV Ab: REACTIVE — AB
Hep A IgM: NONREACTIVE
Hep B C IgM: NONREACTIVE
Hepatitis B Surface Ag: NONREACTIVE

## 2022-10-28 LAB — RPR: RPR Ser Ql: NONREACTIVE

## 2022-10-28 LAB — HEPATIC FUNCTION PANEL
ALT: 35 U/L (ref 0–44)
AST: 42 U/L — ABNORMAL HIGH (ref 15–41)
Albumin: 3.5 g/dL (ref 3.5–5.0)
Alkaline Phosphatase: 128 U/L — ABNORMAL HIGH (ref 38–126)
Bilirubin, Direct: 0.1 mg/dL (ref 0.0–0.2)
Indirect Bilirubin: 0.3 mg/dL (ref 0.3–0.9)
Total Bilirubin: 0.4 mg/dL (ref 0.3–1.2)
Total Protein: 7.1 g/dL (ref 6.5–8.1)

## 2022-10-28 LAB — TSH: TSH: 2.268 u[IU]/mL (ref 0.350–4.500)

## 2022-10-28 LAB — RAPID HIV SCREEN (HIV 1/2 AB+AG)
HIV 1/2 Antibodies: NONREACTIVE
HIV-1 P24 Antigen - HIV24: NONREACTIVE

## 2022-10-28 MED ORDER — INSULIN ASPART 100 UNIT/ML IJ SOLN
5.0000 [IU] | Freq: Three times a day (TID) | INTRAMUSCULAR | Status: DC
  Administered 2022-10-28 – 2022-10-31 (×8): 5 [IU] via SUBCUTANEOUS
  Filled 2022-10-28: qty 0.05

## 2022-10-28 MED ORDER — INSULIN GLARGINE-YFGN 100 UNIT/ML ~~LOC~~ SOLN
15.0000 [IU] | Freq: Every day | SUBCUTANEOUS | Status: DC
  Administered 2022-10-29 – 2022-10-31 (×3): 15 [IU] via SUBCUTANEOUS

## 2022-10-28 MED ORDER — SODIUM CHLORIDE 0.9 % IN NEBU
INHALATION_SOLUTION | RESPIRATORY_TRACT | Status: AC
  Filled 2022-10-28: qty 3

## 2022-10-28 NOTE — Plan of Care (Signed)
  Problem: Education: Goal: Knowledge of [facility] Education information/materials will improve Outcome: Progressing   Problem: Education: Goal: Emotional status will improve Outcome: Progressing   Problem: Education: Goal: Mental status will improve Outcome: Progressing   Problem: Education: Goal: Verbalization of understanding the information provided will improve Outcome: Progressing   

## 2022-10-28 NOTE — Plan of Care (Signed)
  Problem: Education: Goal: Knowledge of [facility] Education information/materials will improve Outcome: Progressing Goal: Emotional status will improve Outcome: Progressing Goal: Mental status will improve Outcome: Progressing Goal: Verbalization of understanding the information provided will improve Outcome: Progressing   Problem: Activity: Goal: Interest or engagement in activities will improve Outcome: Progressing Goal: Sleeping patterns will improve Outcome: Progressing   Problem: Coping: Goal: Ability to verbalize frustrations and anger appropriately will improve Outcome: Progressing

## 2022-10-28 NOTE — Progress Notes (Signed)
   10/28/22 1610  15 Minute Checks  Location Dayroom  Visual Appearance Calm  Behavior Composed  Sleep (Behavioral Health Patients Only)  Calculate sleep? (Click Yes once per 24 hr at 0600 safety check) Yes  Documented sleep last 24 hours 7.25

## 2022-10-28 NOTE — Group Note (Signed)
Recreation Therapy Group Note   Group Topic:Team Building  Group Date: 10/28/2022 Start Time: 0935 End Time: 1015 Facilitators: Amandy Chubbuck-McCall, LRT,CTRS Location: [address] Dayroom   Goal Area(s) Addresses:  Patient will effectively work with peer towards shared goal.  Patient will identify skills used to make activity successful.  Patient will identify how skills used during activity can be used to reach post d/c goals.   Group Description: Straw Bridge. In teams of 3-5, patients were given 15 plastic drinking straws and an equal length of masking tape. Using the materials provided, patients were instructed to build a free standing bridge-like structure to suspend an everyday item (ex: puzzle box) off of the floor or table surface. All materials were required to be used by the team in their design. LRT facilitated post-activity discussion reviewing team process. Patients were encouraged to reflect how the skills used in this activity can be generalized to daily life post discharge.    Affect/Mood: N/A   Participation Level: Did not attend    Clinical Observations/Individualized Feedback:     Plan: Continue to engage patient in RT group sessions 2-3x/week.   Hiram Mciver-McCall, LRT,CTRS 10/28/2022 12:22 PM

## 2022-10-28 NOTE — Consult Note (Signed)
WOC Nurse Consult Note: Reason for Consult: LE wound Review of images and chart. Present x 7 months, unclear etiology. Has been see by [facility] in the past for wound  Wound type: full thickness wound left medial heel  Pressure Injury POA: NA Measurement: see nursing notes Wound bed:100%; new image taken today per provider, healing well  Drainage (amount, consistency, odor) not documented  Periwound: intact, no hyperkeratosis present  Dressing procedure/placement/frequency: Silver hydrofiber for exudate and chronic wound bioburdan. Change every other day.    Discussed POC with patient and bedside nurse.  Re consult if needed, will not follow at this time. Thanks  Adeoluwa Silvers [employer], RN,CWOCN, CNS, CWON-AP [phone])

## 2022-10-28 NOTE — [facility] Group Notes (Signed)

## 2022-10-28 NOTE — Progress Notes (Addendum)
[facility] MD Progress Note  10/28/2022 3:41 PM Antonio [name]  MRN:  [id number] Subjective:   Antonio [name] is a 52 yr old male who presented to [facility] on 7/13 with SI with a plan after being discharged from [facility] earlier that day, he was admitted to [facility] on 7/21.  PPHx is significant for MDD, Social Anxiety Disorder, and Polysubstance Abuse (EtOH, IV Meth, Cocaine) and 2 Suicide Attempts (hanging) and 5 Prior Psychiatric Hospitalizations (last [facility] 04/2022).    Case was discussed in the multidisciplinary team. MAR was reviewed and patient was compliant with medications.  He received PRN Advil yesterday.  He did have CBG of 450 this morning so additional insulin was administered.   Psychiatric Team made the following recommendations yesterday: -Change Gabapentin to 400 mg TID     On interview today patient reports he slept fair last night.  He reports his appetite is doing good.  He reports having Passive SI with no plan.  He reports no HI or AVH.  He reports no Paranoia or Ideas of Reference.  He reports no issues with his medications.  Discussed with him that his snacks where most likely causing his elevated blood sugars.  Encouraged him to not eat high sugar snacks.  Discussed that we had reached out to the diabetic coordinator for recommendations.  Also discussed that we had contact wound care for recommendations of his ankle wound.  He reports no other concerns at present.  Principal Problem: Major depressive disorder, recurrent severe without psychotic features (HCC) Diagnosis: Principal Problem:   Major depressive disorder, recurrent severe without psychotic features (HCC) Active Problems:   Cachexia (HCC)  Total Time spent with patient:  I personally spent 35 minutes on the unit in direct patient care. The direct patient care time included face-to-face time with the patient, reviewing the patient's chart, communicating with other professionals, and coordinating care. Greater than 50% of this  time was spent in counseling or coordinating care with the patient regarding goals of hospitalization, psycho-education, and discharge planning needs.   Past Psychiatric History: MDD, Social Anxiety Disorder, and Polysubstance Abuse (EtOH, IV Meth, Cocaine) and 2 Suicide Attempts (hanging) and 5 Prior Psychiatric Hospitalizations (last [facility] 04/2022).   Past Medical History:  Past Medical History:  Diagnosis Date   Alcoholic hepatitis with ascites    liver bx march 2013, no cirrhosis.  Hep b/c negative   Alcoholism /alcohol abuse 05/2011   Ascites    3 to 4 paracentesis from 2/28 to 06/10/11 at [facility] totalling  19 liters.   DVT (deep venous thrombosis) (HCC) 05/2011   Hepatitis    Pulmonary embolism (HCC) 05/2011   sent home on Xarelto.    Past Surgical History:  Procedure Laterality Date   ANKLE SURGERY     ESOPHAGOGASTRODUODENOSCOPY  3.12.2013   Dr Braulio Conte in [place].  Irregular GE Jx, biopsied benign, naso/SB feeding tube inserted    LIVER BIOPSY  06/2011   transjugular   PARACENTESIS     several during admission at [facility] 2/26- 3/18   Family History: History reviewed. No pertinent family history. 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    He is functionally illiterate, reading skills are poor.    Social Determinants of Health   Financial Resource Strain: Low Risk  (04/12/2022)   Received from [facility], [facility]   Overall Financial Resource Strain (CARDIA)    Difficulty of Paying Living Expenses: Not very hard  Food Insecurity: Patient Declined (10/26/2022)   Hunger Vital Sign    Worried About Running Out of Food in the Last Year: Patient declined    Ran Out of Food in the Last Year: Patient declined  Recent Concern: Food Insecurity - High Risk (10/16/2022)   Received from [facility]   Food vital sign    Within the past 12 months, you worried that your food would run out before you got money to buy more: Often true    Within the past 12 months, the food you bought just didn't last and you didn't have money to get more. : Often true  Transportation Needs: Patient Declined (10/26/2022)   PRAPARE - Administrator, Civil Service (Medical): Patient declined    Lack of Transportation (Non-Medical): Patient declined  Recent Concern: Transportation Needs - Unmet Transportation Needs (10/16/2022)   Received from [employer]    In the past 12 months, has lack of reliable transportation kept you from medical appointments, meetings, work or from getting things needed for daily living? : Yes  Physical Activity: Not on file  Stress: No Stress Concern Present (01/25/2021)   Received from [facility], [facility]   [employer] of Occupational Health - Occupational Stress Questionnaire    Feeling of Stress : Not at all  Social Connections: Unknown (08/21/2021)   Received from [facility], [facility]   Social Network    Social Network: Not on file   Additional Social  History:                         Sleep: Fair  Appetite:  Good  Current Medications: Current Facility-Administered Medications  Medication Dose Route Frequency Provider Last Rate Last Admin   alum & mag hydroxide-simeth (MAALOX/MYLANTA) 200-200-20 MG/5ML suspension 30 mL  30 mL Oral Q4H PRN Augusto Gamble, MD       ARIPiprazole (ABILIFY) tablet 5 mg  5 mg Oral Daily Onuoha, Josephine C, NP   5 mg at 10/28/22 0753   bismuth subsalicylate (PEPTO BISMOL) chewable tablet 524 mg  524 mg Oral Q3H PRN Augusto Gamble, MD       diphenhydrAMINE (BENADRYL) capsule 50 mg  50 mg Oral TID PRN Dahlia Byes C, NP       Or   diphenhydrAMINE (BENADRYL) injection 50 mg  50 mg Intramuscular TID PRN Dahlia Byes C, NP       feeding supplement (ENSURE ENLIVE / ENSURE PLUS) liquid 237 mL  237 mL Oral BID BM Bobbitt, Shalon E, NP       gabapentin (NEURONTIN) capsule 400 mg  400 mg Oral TID Augusto Gamble, MD   400 mg at 10/28/22 1143   haloperidol (HALDOL) tablet 5 mg  5 mg Oral TID PRN Earney Navy, NP       Or   haloperidol lactate (HALDOL) injection 5 mg  5 mg Intramuscular TID PRN Earney Navy, NP       hydrOXYzine (ATARAX) tablet 25 mg  25 mg Oral TID PRN Augusto Gamble, MD   25 mg at 10/28/22 1144   ibuprofen (ADVIL) tablet 800 mg  800 mg Oral Q6H PRN Dahlia Byes C, NP   800 mg at 10/28/22 1141   insulin aspart (novoLOG) injection 0-15 Units  0-15 Units Subcutaneous TID WC Dahlia Byes C, NP   8 Units at 10/28/22 1145   insulin aspart (novoLOG) injection 0-5 Units  0-5 Units Subcutaneous QHS Dahlia Byes C, NP   2 Units at 10/27/22 2140   insulin glargine-yfgn (SEMGLEE) injection 12 Units  12 Units Subcutaneous Daily Dahlia Byes C, NP   12 Units at 10/28/22 0755   LORazepam (ATIVAN) tablet 2 mg  2 mg Oral TID PRN Earney Navy, NP       Or   LORazepam (ATIVAN) injection 2 mg  2 mg Intramuscular TID PRN Earney Navy, NP       metFORMIN (GLUCOPHAGE-XR)  24 hr tablet 750 mg  750 mg Oral BID WC Onuoha, Josephine C, NP   750 mg at 10/28/22 0754   nicotine (NICODERM CQ - dosed in mg/24 hours) patch 14 mg  14 mg Transdermal Daily PRN Augusto Gamble, MD       nicotine polacrilex (NICORETTE) gum 2 mg  2 mg Oral PRN Augusto Gamble, MD       ondansetron [facility]) tablet 8 mg  8 mg Oral Q8H PRN Augusto Gamble, MD       polyethylene glycol (MIRALAX / GLYCOLAX) packet 17 g  17 g Oral Daily PRN Augusto Gamble, MD       senna (SENOKOT) tablet 8.6 mg  1 tablet Oral QHS PRN Augusto Gamble, MD       sertraline (ZOLOFT) tablet 100 mg  100 mg Oral Daily Onuoha, Josephine C, NP   100 mg at 10/28/22 0754   sodium chloride 0.9 % nebulizer solution            traZODone (DESYREL) tablet 50 mg  50 mg Oral QHS Dahlia Byes C, NP   50 mg at 10/27/22 2142    Lab Results:  Results for orders placed or performed during the hospital encounter of 10/26/22 (from the past 48 hour(s))  Glucose, capillary     Status: Abnormal   Collection Time: 10/26/22 10:12 PM  Result Value Ref Range   Glucose-Capillary 289 (H) 70 - 99 mg/dL    Comment: Glucose reference range applies only to samples taken after fasting for at least 8 hours.  Glucose, capillary     Status: Abnormal   Collection Time: 10/27/22  6:18 AM  Result Value Ref Range   Glucose-Capillary 350 (H) 70 - 99 mg/dL    Comment: Glucose reference range applies only to samples taken after fasting for at least 8 hours.  Glucose, capillary  Status: Abnormal   Collection Time: 10/27/22 11:45 AM  Result Value Ref Range   Glucose-Capillary 371 (H) 70 - 99 mg/dL    Comment: Glucose reference range applies only to samples taken after fasting for at least 8 hours.  Glucose, capillary     Status: Abnormal   Collection Time: 10/27/22  5:09 PM  Result Value Ref Range   Glucose-Capillary 382 (H) 70 - 99 mg/dL    Comment: Glucose reference range applies only to samples taken after fasting for at least 8 hours.  Glucose, capillary      Status: Abnormal   Collection Time: 10/27/22  8:10 PM  Result Value Ref Range   Glucose-Capillary 227 (H) 70 - 99 mg/dL    Comment: Glucose reference range applies only to samples taken after fasting for at least 8 hours.  Glucose, capillary     Status: Abnormal   Collection Time: 10/28/22  5:51 AM  Result Value Ref Range   Glucose-Capillary 447 (H) 70 - 99 mg/dL    Comment: Glucose reference range applies only to samples taken after fasting for at least 8 hours.   Comment 1 Notify RN    Comment 2 Document in Chart   Glucose, capillary     Status: Abnormal   Collection Time: 10/28/22  6:36 AM  Result Value Ref Range   Glucose-Capillary 464 (H) 70 - 99 mg/dL    Comment: Glucose reference range applies only to samples taken after fasting for at least 8 hours.   Comment 1 Notify RN    Comment 2 Document in Chart   Lipid panel     Status: None   Collection Time: 10/28/22  6:51 AM  Result Value Ref Range   Cholesterol 136 0 - 200 mg/dL   Triglycerides 960 <454 mg/dL   HDL 51 >09 mg/dL   Total CHOL/HDL Ratio 2.7 RATIO   VLDL 23 0 - 40 mg/dL   LDL Cholesterol 62 0 - 99 mg/dL    Comment:        Total Cholesterol/HDL:CHD Risk Coronary Heart Disease Risk Table                     Men   Women  1/2 Average Risk   3.4   3.3  Average Risk       5.0   4.4  2 X Average Risk   9.6   7.1  3 X Average Risk  23.4   11.0        Use the calculated Patient Ratio above and the CHD Risk Table to determine the patient's CHD Risk.        ATP III CLASSIFICATION (LDL):  <100     mg/dL   Optimal  811-914  mg/dL   Near or Above                    Optimal  130-159  mg/dL   Borderline  782-956  mg/dL   High  >213     mg/dL   Very High Performed at [facility], [address], [place], [place] [address]   TSH     Status: None   Collection Time: 10/28/22  6:51 AM  Result Value Ref Range   TSH 2.268 0.350 - 4.500 uIU/mL    Comment: Performed by a 3rd Generation assay with a  functional sensitivity of <=0.01 uIU/mL. Performed at [facility], [address], [place], [place] [address]  RPR     Status: None   Collection Time: 10/28/22  6:51 AM  Result Value Ref Range   RPR Ser Ql NON REACTIVE NON REACTIVE    Comment: Performed at [facility], [address] [place], [place] [address]  Rapid HIV screen (HIV 1/2 Ab+Ag)     Status: None   Collection Time: 10/28/22  6:51 AM  Result Value Ref Range   HIV-1 P24 Antigen - HIV24 NON REACTIVE NON REACTIVE    Comment: (NOTE) Detection of p24 may be inhibited by biotin in the sample, causing false negative results in acute infection.    HIV 1/2 Antibodies NON REACTIVE NON REACTIVE   Interpretation (HIV Ag Ab)      A non reactive test result means that HIV 1 or HIV 2 antibodies and HIV 1 p24 antigen were not detected in the specimen.    Comment: Performed at [facility], [address], [place], [place] [address]  Hepatitis panel, acute     Status: Abnormal   Collection Time: 10/28/22  6:51 AM  Result Value Ref Range   Hepatitis B Surface Ag NON REACTIVE NON REACTIVE   HCV Ab Reactive (A) NON REACTIVE    Comment: (NOTE) The CDC recommends that a Reactive HCV antibody result be followed up  with a HCV Nucleic Acid Amplification test.     Hep A IgM NON REACTIVE NON REACTIVE   Hep B C IgM NON REACTIVE NON REACTIVE    Comment: Performed at [facility], [address], [place], [place] [address]  Hepatic function panel     Status: Abnormal   Collection Time: 10/28/22  6:51 AM  Result Value Ref Range   Total Protein 7.1 6.5 - 8.1 g/dL   Albumin 3.5 3.5 - 5.0 g/dL   AST 42 (H) 15 - 41 U/L   ALT 35 0 - 44 U/L   Alkaline Phosphatase 128 (H) 38 - 126 U/L   Total Bilirubin 0.4 0.3 - 1.2 mg/dL   Bilirubin, Direct 0.1 0.0 - 0.2 mg/dL   Indirect Bilirubin 0.3 0.3 - 0.9 mg/dL    Comment: Performed at [facility], [address] [place],  [place] [address]  Basic metabolic panel     Status: Abnormal   Collection Time: 10/28/22  7:01 AM  Result Value Ref Range   Sodium 134 (L) 135 - 145 mmol/L   Potassium 4.2 3.5 - 5.1 mmol/L   Chloride 104 98 - 111 mmol/L   CO2 20 (L) 22 - 32 mmol/L   Glucose, Bld 495 (H) 70 - 99 mg/dL    Comment: Glucose reference range applies only to samples taken after fasting for at least 8 hours.   BUN 23 (H) 6 - 20 mg/dL   Creatinine, Ser 2.95 0.61 - 1.24 mg/dL   Calcium 8.5 (L) 8.9 - 10.3 mg/dL   GFR, Estimated >62 >13 mL/min    Comment: (NOTE) Calculated using the CKD-EPI Creatinine Equation (2021)    Anion gap 10 5 - 15    Comment: Performed at [facility], [address] [place], [place] [address]  Glucose, capillary     Status: Abnormal   Collection Time: 10/28/22  7:14 AM  Result Value Ref Range   Glucose-Capillary 427 (H) 70 - 99 mg/dL    Comment: Glucose reference range applies only to samples taken after fasting for at least 8 hours.   Comment 1 Notify RN  Comment 2 Document in Chart   Glucose, capillary     Status: Abnormal   Collection Time: 10/28/22 11:45 AM  Result Value Ref Range   Glucose-Capillary 294 (H) 70 - 99 mg/dL    Comment: Glucose reference range applies only to samples taken after fasting for at least 8 hours.  Glucose, capillary     Status: Abnormal   Collection Time: 10/28/22  3:28 PM  Result Value Ref Range   Glucose-Capillary 288 (H) 70 - 99 mg/dL    Comment: Glucose reference range applies only to samples taken after fasting for at least 8 hours.    Blood Alcohol level:  Lab Results  Component Value Date   ETH <10 10/19/2022    Metabolic Disorder Labs: Lab Results  Component Value Date   HGBA1C 9.9 (H) 10/19/2022   MPG 237 10/19/2022   MPG 306 10/02/2021   No results found for: "PROLACTIN" Lab Results  Component Value Date   CHOL 136 10/28/2022   TRIG 113 10/28/2022   HDL 51 10/28/2022   CHOLHDL 2.7 10/28/2022   VLDL 23  10/28/2022   LDLCALC 62 10/28/2022   LDLCALC 82 09/15/2020    Physical Findings: AIMS:  , ,  ,  ,    CIWA:    COWS:     Musculoskeletal: Strength & Muscle Tone: decreased Gait & Station:  laying in bed during interview Patient leans: N/A  Psychiatric Specialty Exam:  Presentation  General Appearance:  Disheveled  Eye Contact: Poor  Speech: Slow; Garbled  Speech Volume: Decreased  Handedness: Right   Mood and Affect  Mood: Depressed  Affect: Congruent; Depressed; Flat   Thought Process  Thought Processes: Coherent; Goal Directed  Descriptions of Associations:Intact  Orientation:Full (Time, Place and Person)  Thought Content:Logical; WDL  History of Schizophrenia/Schizoaffective disorder:No  Duration of Psychotic Symptoms:No data recorded Hallucinations:Hallucinations: None  Ideas of Reference:None  Suicidal Thoughts:Suicidal Thoughts: Yes, Passive SI Passive Intent and/or Plan: Without Plan  Homicidal Thoughts:Homicidal Thoughts: No   Sensorium  Memory: Immediate Fair; Recent Fair  Judgment: Fair  Insight: Fair   Chartered certified accountant: Fair  Attention Span: Fair  Recall: Fair  Fund of Knowledge: Good  Language: Fair   Psychomotor Activity  Psychomotor Activity: Psychomotor Activity: Normal   Assets  Assets: Resilience; Desire for Improvement   Sleep  Sleep: Sleep: Fair    Physical Exam: Physical Exam Vitals and nursing note reviewed.  Constitutional:      General: He is not in acute distress.    Appearance: He is ill-appearing. He is not toxic-appearing.  HENT:     Head: Normocephalic and atraumatic.  Pulmonary:     Effort: Pulmonary effort is normal.  Neurological:     General: No focal deficit present.     Mental Status: He is alert.    Review of Systems  Respiratory:  Negative for cough and shortness of breath.   Cardiovascular:  Negative for chest pain.  Gastrointestinal:   Positive for nausea. Negative for abdominal pain, constipation, diarrhea and vomiting.  Neurological:  Positive for dizziness and headaches. Negative for weakness.  Psychiatric/Behavioral:  Positive for depression and suicidal ideas (Passive, no plan). Negative for hallucinations. The patient is not nervous/anxious.    Blood pressure 117/84, pulse (!) 115, temperature 98.4 F (36.9 C), temperature source Oral, resp. rate 18, height 6' (1.829 m), weight 56.6 kg, SpO2 99%. Body mass index is 16.93 kg/m.   Treatment Plan Summary: Daily contact with patient to assess and  evaluate symptoms and progress in treatment and Medication management  Garlen Reinig is a 52 yr old male who presented to [facility] on 7/13 with SI with a plan after being discharged from [facility] earlier that day, he was admitted to [facility] on 7/21.  PPHx is significant for MDD, Social Anxiety Disorder, and Polysubstance Abuse (EtOH, IV Meth, Cocaine) and 2 Suicide Attempts (hanging) and 5 Prior Psychiatric Hospitalizations (last [facility] 04/2022).    Kellon has been having significant swings with his CBG and has been feeling very sick due to this.  We have encouraged him to not snack on high sugar foods.  We have placed a consult with the diabetic coordinator for further recommendations.  Wound care has also placed recommendations for his left ankle wound.  Given how sick he is from the rapid shifts in CBG we will not make any other changes to his medications at this time.   MDD, Recurrent, Severe: -Continue Zoloft 100 mg daily for depression -Continue Abilify 5 mg daily for augmentation -Continue Gabapentin 400 mg TID for anxiety -Continue Agitation Protocol: Haldol/Benadryl/Ativan   Diabetes: -Continue SSI -Consult to Diabetic Coordinator placed -Continue Semglee 12 units daily -Continue Metformin 750 mg BID   Left Ankle Wound: -Consulted Wound Care for recommendations   Nicotine Dependence: -Continue Nicotine Patch 14 mg  daily -Continue Nicotine Gum 2 mg PRN   -Continue Ensure 237 mg BID -Continue PRN's: Pepto Bismol, Maalox, Atarax, Advil, Milk of Magnesia, Trazodone, Miralax, Senokot   Lauro Franklin, MD 10/28/2022, 3:41 PM

## 2022-10-28 NOTE — Progress Notes (Signed)
Nursing Note: 0700-1900  Pt reports that he slept "ok" last night, appetite is good and pt is tolerating prescribed medication without side effects. Denies A/V hallucinations and is able to verbally contract for safety. Pt c/o pain to left ankle where his ulcer is located. WOC completed and we are waiting for recommended supplies to arrive (ordered this am). The bed of ulcer is beefy red, no signs of infection observed.  CBG 's: 427, 294, 288, 379 this shift. Received new orders to give additional 5 units of Novolog with meals. Pt not able to eat snacks due to increased CBG's unless CBG is less than 200 at time of snack.    Pt. encouraged to verbalize needs and concerns, active listening and support provided.  Continued Q 15 minute safety checks.     10/28/22 0800  Psych Admission Type (Psych Patients Only)  Admission Status Voluntary  Psychosocial Assessment  Patient Complaints Depression  Eye Contact Brief  Facial Expression Sad;Sullen  Affect Depressed;Flat  Speech Logical/coherent  Interaction Guarded  Motor Activity Slow  Appearance/Hygiene Disheveled  Behavior Characteristics Cooperative  Mood Depressed  Thought Process  Coherency WDL  Content WDL  Delusions None reported or observed  Perception WDL  Hallucination None reported or observed  Judgment Poor  Confusion None  Danger to Self  Current suicidal ideation? Denies  Self-Injurious Behavior No self-injurious ideation or behavior indicators observed or expressed   Agreement Not to Harm Self Yes  Description of Agreement Verbal  Danger to Others  Danger to Others None reported or observed

## 2022-10-28 NOTE — Progress Notes (Signed)
Blood sugar 447, provider called and updated, patient received insulin Novolog 15 Units per MD order. Patient is not in any distress.

## 2022-10-28 NOTE — Group Note (Signed)
Date:  10/28/2022 Time:  10:59 AM  Group Topic/Focus:  Emotional Education:   The focus of this group is to discuss what feelings/emotions are, and how they are experienced.    Participation Level:  Did Not Attend    Antonio [name] 10/28/2022, 10:59 AM

## 2022-10-28 NOTE — BH IP Treatment Plan (Signed)
Interdisciplinary Treatment and Diagnostic Plan Update  10/28/2022 Time of Session: 11:00 AM  Antonio [name] MRN: [id number]  Principal Diagnosis: Major depressive disorder, recurrent severe without psychotic features (HCC)  Secondary Diagnoses: Principal Problem:   Major depressive disorder, recurrent severe without psychotic features (HCC) Active Problems:   Cachexia (HCC)   Current Medications:  Current Facility-Administered Medications  Medication Dose Route Frequency Provider Last Rate Last Admin   alum & mag hydroxide-simeth (MAALOX/MYLANTA) 200-200-20 MG/5ML suspension 30 mL  30 mL Oral Q4H PRN Augusto Gamble, MD       ARIPiprazole (ABILIFY) tablet 5 mg  5 mg Oral Daily Dahlia Byes C, NP   5 mg at 10/28/22 0753   bismuth subsalicylate (PEPTO BISMOL) chewable tablet 524 mg  524 mg Oral Q3H PRN Augusto Gamble, MD       diphenhydrAMINE (BENADRYL) capsule 50 mg  50 mg Oral TID PRN Earney Navy, NP       Or   diphenhydrAMINE (BENADRYL) injection 50 mg  50 mg Intramuscular TID PRN Earney Navy, NP       feeding supplement (ENSURE ENLIVE / ENSURE PLUS) liquid 237 mL  237 mL Oral BID BM Bobbitt, Shalon E, NP       gabapentin (NEURONTIN) capsule 400 mg  400 mg Oral TID Augusto Gamble, MD   400 mg at 10/28/22 1143   haloperidol (HALDOL) tablet 5 mg  5 mg Oral TID PRN Earney Navy, NP       Or   haloperidol lactate (HALDOL) injection 5 mg  5 mg Intramuscular TID PRN Earney Navy, NP       hydrOXYzine (ATARAX) tablet 25 mg  25 mg Oral TID PRN Augusto Gamble, MD   25 mg at 10/28/22 1144   ibuprofen (ADVIL) tablet 800 mg  800 mg Oral Q6H PRN Dahlia Byes C, NP   800 mg at 10/28/22 1141   insulin aspart (novoLOG) injection 0-15 Units  0-15 Units Subcutaneous TID WC Dahlia Byes C, NP   8 Units at 10/28/22 1145   insulin aspart (novoLOG) injection 0-5 Units  0-5 Units Subcutaneous QHS Dahlia Byes C, NP   2 Units at 10/27/22 2140   insulin glargine-yfgn  (SEMGLEE) injection 12 Units  12 Units Subcutaneous Daily Dahlia Byes C, NP   12 Units at 10/28/22 0755   LORazepam (ATIVAN) tablet 2 mg  2 mg Oral TID PRN Earney Navy, NP       Or   LORazepam (ATIVAN) injection 2 mg  2 mg Intramuscular TID PRN Earney Navy, NP       metFORMIN (GLUCOPHAGE-XR) 24 hr tablet 750 mg  750 mg Oral BID WC Onuoha, Josephine C, NP   750 mg at 10/28/22 0754   nicotine (NICODERM CQ - dosed in mg/24 hours) patch 14 mg  14 mg Transdermal Daily PRN Augusto Gamble, MD       nicotine polacrilex (NICORETTE) gum 2 mg  2 mg Oral PRN Augusto Gamble, MD       ondansetron [facility]) tablet 8 mg  8 mg Oral Q8H PRN Augusto Gamble, MD       polyethylene glycol (MIRALAX / GLYCOLAX) packet 17 g  17 g Oral Daily PRN Augusto Gamble, MD       senna (SENOKOT) tablet 8.6 mg  1 tablet Oral QHS PRN Augusto Gamble, MD       sertraline (ZOLOFT) tablet 100 mg  100 mg Oral Daily Earney Navy, NP   100  mg at 10/28/22 0754   traZODone (DESYREL) tablet 50 mg  50 mg Oral QHS Dahlia Byes C, NP   50 mg at 10/27/22 2142   PTA Medications: Medications Prior to Admission  Medication Sig Dispense Refill Last Dose   ARIPiprazole (ABILIFY) 10 MG tablet Take 10 mg by mouth daily.      gabapentin (NEURONTIN) 600 MG tablet Take 600 mg by mouth 4 (four) times daily.      HUMALOG KWIKPEN 100 UNIT/ML KwikPen Inject 2-8 Units into the skin See admin instructions. Inject 2-8 units into the skin four times a day, per sliding scale: BGL 140-180 = 2 units; 181-240 = 4 units; 241-300 = 6 units; 301-350 = 8 units      LANTUS SOLOSTAR 100 UNIT/ML Solostar Pen Inject 12 Units into the skin 2 (two) times daily.      metFORMIN (GLUCOPHAGE-XR) 750 MG 24 hr tablet Take 1 tablet (750 mg total) by mouth 2 (two) times daily with a meal. 60 tablet 0    sertraline (ZOLOFT) 100 MG tablet Take 1 tablet (100 mg total) by mouth daily. 30 tablet 0    traZODone (DESYREL) 50 MG tablet Take 50 mg by mouth at bedtime.        Patient Stressors: Financial difficulties   Health problems   Medication change or noncompliance   Occupational concerns    Patient Strengths: Average or above average intelligence  Motivation for treatment/growth  Physical Health   Treatment Modalities: Medication Management, Group therapy, Case management,  1 to 1 session with clinician, Psychoeducation, Recreational therapy.   Physician Treatment Plan for Primary Diagnosis: Major depressive disorder, recurrent severe without psychotic features (HCC) Long Term Goal(s):     Short Term Goals:    Medication Management: Evaluate patient's response, side effects, and tolerance of medication regimen.  Therapeutic Interventions: 1 to 1 sessions, Unit Group sessions and Medication administration.  Evaluation of Outcomes: Not Progressing  Physician Treatment Plan for Secondary Diagnosis: Principal Problem:   Major depressive disorder, recurrent severe without psychotic features (HCC) Active Problems:   Cachexia (HCC)  Long Term Goal(s):     Short Term Goals:       Medication Management: Evaluate patient's response, side effects, and tolerance of medication regimen.  Therapeutic Interventions: 1 to 1 sessions, Unit Group sessions and Medication administration.  Evaluation of Outcomes: Not Progressing   RN Treatment Plan for Primary Diagnosis: Major depressive disorder, recurrent severe without psychotic features (HCC) Long Term Goal(s): Knowledge of disease and therapeutic regimen to maintain health will improve  Short Term Goals: Ability to remain free from injury will improve, Ability to verbalize frustration and anger appropriately will improve, Ability to demonstrate self-control, Ability to participate in decision making will improve, Ability to verbalize feelings will improve, Ability to disclose and discuss suicidal ideas, Ability to identify and develop effective coping behaviors will improve, and Compliance with  prescribed medications will improve  Medication Management: RN will administer medications as ordered by provider, will assess and evaluate patient's response and provide education to patient for prescribed medication. RN will report any adverse and/or side effects to prescribing provider.  Therapeutic Interventions: 1 on 1 counseling sessions, Psychoeducation, Medication administration, Evaluate responses to treatment, Monitor vital signs and CBGs as ordered, Perform/monitor CIWA, COWS, AIMS and Fall Risk screenings as ordered, Perform wound care treatments as ordered.  Evaluation of Outcomes: Not Progressing   LCSW Treatment Plan for Primary Diagnosis: Major depressive disorder, recurrent severe without psychotic features (HCC) Long Term  Goal(s): Safe transition to appropriate next level of care at discharge, Engage patient in therapeutic group addressing interpersonal concerns.  Short Term Goals: Engage patient in aftercare planning with referrals and resources, Increase social support, Increase ability to appropriately verbalize feelings, Increase emotional regulation, Facilitate acceptance of mental health diagnosis and concerns, Facilitate patient progression through stages of change regarding substance use diagnoses and concerns, Identify triggers associated with mental health/substance abuse issues, and Increase skills for wellness and recovery  Therapeutic Interventions: Assess for all discharge needs, 1 to 1 time with Social worker, Explore available resources and support systems, Assess for adequacy in community support network, Educate family and significant other(s) on suicide prevention, Complete Psychosocial Assessment, Interpersonal group therapy.  Evaluation of Outcomes: Not Progressing   Progress in Treatment: Attending groups: No. Participating in groups: No. Taking medication as prescribed: Yes. Toleration medication: Yes. Family/Significant other contact made: No, will  contact:  Patient declined  Patient understands diagnosis: No. Discussing patient identified problems/goals with staff: Yes. Medical problems stabilized or resolved: Yes. Denies suicidal/homicidal ideation: Yes. Issues/concerns per patient self-inventory: No.   New problem(s) identified: No, Describe:  None reported   New Short Term/Long Term Goal(s):  medication stabilization, elimination of SI thoughts, development of comprehensive mental wellness plan.    Patient Goals:  " Get better "   Discharge Plan or Barriers: Patient recently admitted. CSW will continue to follow and assess for appropriate referrals and possible discharge planning.    Reason for Continuation of Hospitalization: Anxiety Depression Medication stabilization Suicidal ideation   Estimated Length of Stay: 3-5 days   Last 3 [place] Suicide Severity Risk Score: Flowsheet Row Ad[facility] (Current) [facility] Most recent reading at 10/26/2022 10:56 PM ED from 10/19/2022 in [facility] Emergency Department at [facility] Most recent reading at 10/19/2022  9:59 PM ED from 10/19/2022 in [facility] Department at [facility] Most recent reading at 10/19/2022 12:38 PM  C-SSRS RISK CATEGORY Low Risk High Risk No Risk       Last PHQ 2/9 Scores:     No data to display          Scribe for Treatment Team: Beather Arbour 10/28/2022 12:55 PM

## 2022-10-28 NOTE — Progress Notes (Signed)
   10/27/22 2100  Psych Admission Type (Psych Patients Only)  Admission Status Voluntary  Psychosocial Assessment  Patient Complaints Anxiety;Depression  Eye Contact Fair  Facial Expression Sad  Affect Depressed;Flat  Speech Logical/coherent  Interaction Guarded  Motor Activity Slow  Appearance/Hygiene Disheveled  Behavior Characteristics Cooperative  Mood Depressed  Thought Process  Coherency WDL  Content WDL  Delusions None reported or observed  Perception WDL  Hallucination None reported or observed  Judgment Poor  Confusion None  Danger to Self  Current suicidal ideation? Denies  Self-Injurious Behavior No self-injurious ideation or behavior indicators observed or expressed   Agreement Not to Harm Self Yes  Description of Agreement Verbal  Danger to Others  Danger to Others None reported or observed

## 2022-10-28 NOTE — Inpatient Diabetes Management (Signed)
Inpatient Diabetes Program Recommendations  AACE/ADA: New Consensus Statement on Inpatient Glycemic Control (2015)  Target Ranges:  Prepandial:   less than 140 mg/dL      Peak postprandial:   less than 180 mg/dL (1-2 hours)      Critically ill patients:  140 - 180 mg/dL   Lab Results  Component Value Date   GLUCAP 294 (H) 10/28/2022   HGBA1C 9.9 (H) 10/19/2022    Review of Glycemic Control  Diabetes history: DM2 Outpatient Diabetes medications: Humalog 2-8 QID, Lantus 12 units BID, metformin 750 BID Current orders for Inpatient glycemic control: Semglee 12 every day, Novolog 0-15 TID with meals and 0-5 HS, metformin 750 BID  HgbA1C - 9/9%  Inpatient Diabetes Program Recommendations:    Increase Semglee to 15 units every day  Add Novolog 5 units TID with meals if eating > 50%  See progress note on 6/26 from Inpatient Diabetes Management.   Continue to follow.  Thank you. Ailene Ards, RD, LDN, CDCES Inpatient Diabetes Coordinator [phone]

## 2022-10-28 NOTE — Progress Notes (Signed)
RN Oletta Lamas informed NP patient has a BG 447, advised RN to give patient sliding scale novolog 15 units. Patient is on a modified carb diet and is not supposed to have bedtime snacks. However, RN reports that she observed patient at bedtime with empty snack wrappers at the bedside. RN rechecked BG is now 464. NP advised RN to administer patient scheduled semglee 12 units. RN reports patient is asymptomatic. NP ordered lab BMP for patient. Patient has order for a diabetes consult.

## 2022-10-28 NOTE — Group Note (Signed)
Occupational Therapy Group Note  Group Topic: Sleep Hygiene  Group Date: 10/28/2022 Start Time: 1430 End Time: 1500 Facilitators: Ted Mcalpine, OT   Group Description: Group encouraged increased participation and engagement through topic focused on sleep hygiene. Patients reflected on the quality of sleep they typically receive and identified areas that need improvement. Group was given background information on sleep and sleep hygiene, including common sleep disorders. Group members also received information on how to improve one's sleep and introduced a sleep diary as a tool that can be utilized to track sleep quality over a length of time. Group session ended with patients identifying one or more strategies they could utilize or implement into their sleep routine in order to improve overall sleep quality.        Therapeutic Goal(s):  Identify one or more strategies to improve overall sleep hygiene  Identify one or more areas of sleep that are negatively impacted (sleep too much, too little, etc)     Participation Level: Engaged   Participation Quality: Independent   Behavior: Appropriate   Speech/Thought Process: Relevant   Affect/Mood: Flat   Insight: Fair   Judgement: Fair      Modes of Intervention: Education  Patient Response to Interventions:  Attentive   Plan: Continue to engage patient in OT groups 2 - 3x/week.  10/28/2022  Ted Mcalpine, OT Kerrin Champagne, OT

## 2022-10-28 NOTE — [facility] Group Notes (Signed)
[facility] Group Notes:  (Nursing/MHT/Case Management/Adjunct)  Date:  10/28/2022  Time:  2000  Type of Therapy:  Alcoholics  Anonymous  Meeting  Participation Level:  Active  Participation Quality:  Appropriate and Attentive  Affect:  Blunted  Cognitive:  Alert  Insight:  Limited  Engagement in Group:  Developing/Improving  Modes of Intervention:  Education and Support  Summary of Progress/Problems:   Marcille Buffy 10/28/2022, 9:48 PM

## 2022-10-28 NOTE — Group Note (Signed)
Date:  10/28/2022 Time:  9:27 AM  Group Topic/Focus:  Goals Group:   The focus of this group is to help patients establish daily goals to achieve during treatment and discuss how the patient can incorporate goal setting into their daily lives to aide in recovery.    Participation Level:  Did Not Attend   Antonio [name] 10/28/2022, 9:27 AM

## 2022-10-28 NOTE — Progress Notes (Signed)
Patient refused to come out for his bedtime medication. During assessment, patient sitting up in his bed, patient eating snacks, noted emptied bags of chips laying on the floor,  patient had assorted kinds of snack in front of him and was still eating. Diabetic education on diet provided and reminded. Blood sugar was 227. Patient received scheduled Trazodone and 2 units of Novolog insulin at bedtime per sliding scale, no side effects reported. Patient denies SI,HI,and AVH. Patient remains safe on the unit and his room, routine safety checks ongoing.

## 2022-10-28 NOTE — Progress Notes (Signed)
Blood sugar rechecked 15 minutes after administration of insulin Novolog 15 Units, was 464. Patient remains asymptomatic, provider called and updated, Lab BMP obtained per order. Patient resting in bed, no s/s of distress noted.

## 2022-10-29 LAB — GLUCOSE, CAPILLARY
Glucose-Capillary: 328 mg/dL — ABNORMAL HIGH (ref 70–99)
Glucose-Capillary: 230 mg/dL — ABNORMAL HIGH (ref 70–99)
Glucose-Capillary: 270 mg/dL — ABNORMAL HIGH (ref 70–99)

## 2022-10-29 NOTE — Progress Notes (Addendum)
[facility] MD Progress Note  10/29/2022 7:28 AM Antonio [name]  MRN:  [id number]  Principal Problem: Major depressive disorder, recurrent severe without psychotic features (HCC) Diagnosis: Principal Problem:   Major depressive disorder, recurrent severe without psychotic features (HCC) Active Problems:   Cachexia (HCC)  Reason for Admission:  Antonio [name] is a 52 y.o., male with a past psychiatric history of major depressive disorder and social anxiety disorder, substance use history of stimulant use disorder, tobacco use disorder, and tobacco use disorder, and significant PMHx of uncontrolled diabetes  who presents to the [facility] Voluntary from  Emergency Department  for evaluation and management of suicidal ideations (admitted on 10/26/2022, total  LOS: 3 days )  Chart Review from last 24 hours:  The patient's chart was reviewed and nursing notes were reviewed. The patient's case was discussed in multidisciplinary team meeting.   - Overnight events to report per chart review / staff report:  poorly controlled blood sugars per nursing - Patient received all scheduled medications except: feeding supplement and bedtime insulin - Patient received the following PRN medications: hydroxyzine and ibuprofen  Information Obtained Today During Patient Interview: The patient was seen and evaluated on the unit. On assessment today the patient reports feeling better, not feeling depressed or anxious today. He has no suicidal thoughts.  I asked patient if he was ever diagnosed with throat cancer-he denies. He does say he has "a hole in my throat" and thinks he has cancer. I brought up to patient his complex medical issues including his poorly controlled diabetes and debility in the setting of complex social determinants of health and whether or not he was amenable to speaking with palliative care for goals of care discussion. Patient was agreeable to this plan.  Patient endorses good sleep;  endorses good appetite.  Patient does not endorse any side-effects they attribute to medications.  Psychotropic medications: Current Gabapentin 600 mg twice a day - patient reportedly taking consistently, reports good response Sertraline 100 mg daily - patient reportedly taking consistently, reports good response Trazodone 10 mg daily at bedtime ("the white pill") - patient reportedly taking consistently, reports good response Aripiprazole daily, cannot remember dose - patient reportedly taking consistently, reports good response   Past Denies other medication trials   Allergies: endorses the following medication allergies with resulting symptoms: acetaminophen   Substance Use History: Alcohol: sober for 20 years, 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    --------   Tobacco: endorses, current, smokes 1 packs per day since age 14 Cannabis (marijuana): endorses smoking, last time 3 weeks ago Cocaine: tried in past and has since quit, no current use Methamphetamines:  endorses using, last time 3 weeks ago Psilocybin (mushrooms): never tried Ecstasy (MDMA / molly): never tried Opiates (fentanyl / heroin): tried in past, no current use Benzos (Xanax, Klonopin): never tried IV drug use: endorses ("it's how I use meth") Prescribed meds abuse: denies   History of detox: attempted detox from methamphetamines and cannabis with good results History of rehab: denies  Past Medical/Surgical History:  Medical Diagnoses: throat cancer, diabetes, liver issues Home Rx: insulin Prior Hosp: multiple times Prior Surgeries / non-head trauma: ankle surgery   Head trauma: denies LOC: endorses prior to this admission Concussions: denies Seizures: denies   Last menstrual period and contraceptives: N/A   Family History:  Medical: unknown Psych: unknown Suicide: unknown Homicide: unknown Substance use family hx: unknown   Social History:  Place of birth and grew up where: born in [place] Abuse: no  history  of abuse Marital Status: single Sexual orientation: straight Children: denies Employment: unemployed Housing: Teacher, English as a foreign language: no reliable source of income Legal: no Special educational needs teacher: never served Consulting civil engineer: denies owning any firearms Pills stockpile: none  Current Medications: Current Facility-Administered Medications  Medication Dose Route Frequency Provider Last Rate Last Admin   alum & mag hydroxide-simeth (MAALOX/MYLANTA) 200-200-20 MG/5ML suspension 30 mL  30 mL Oral Q4H PRN Augusto Gamble, MD       ARIPiprazole (ABILIFY) tablet 5 mg  5 mg Oral Daily Dahlia Byes C, NP   5 mg at 10/28/22 0753   bismuth subsalicylate (PEPTO BISMOL) chewable tablet 524 mg  524 mg Oral Q3H PRN Augusto Gamble, MD       diphenhydrAMINE (BENADRYL) capsule 50 mg  50 mg Oral TID PRN Earney Navy, NP       Or   diphenhydrAMINE (BENADRYL) injection 50 mg  50 mg Intramuscular TID PRN Earney Navy, NP       feeding supplement (ENSURE ENLIVE / ENSURE PLUS) liquid 237 mL  237 mL Oral BID BM Bobbitt, Shalon E, NP       gabapentin (NEURONTIN) capsule 400 mg  400 mg Oral TID Augusto Gamble, MD   400 mg at 10/28/22 1727   haloperidol (HALDOL) tablet 5 mg  5 mg Oral TID PRN Earney Navy, NP       Or   haloperidol lactate (HALDOL) injection 5 mg  5 mg Intramuscular TID PRN Earney Navy, NP       hydrOXYzine (ATARAX) tablet 25 mg  25 mg Oral TID PRN Augusto Gamble, MD   25 mg at 10/28/22 1144   ibuprofen (ADVIL) tablet 800 mg  800 mg Oral Q6H PRN Dahlia Byes C, NP   800 mg at 10/28/22 1141   insulin aspart (novoLOG) injection 0-15 Units  0-15 Units Subcutaneous TID WC Dahlia Byes C, NP   11 Units at 10/29/22 4742   insulin aspart (novoLOG) injection 0-5 Units  0-5 Units Subcutaneous QHS Dahlia Byes C, NP   2 Units at 10/27/22 2140   insulin aspart (novoLOG) injection 5 Units  5 Units Subcutaneous TID WC Massengill, Harrold Donath, MD   5 Units at 10/29/22 0713   insulin  glargine-yfgn (SEMGLEE) injection 15 Units  15 Units Subcutaneous Daily Massengill, Harrold Donath, MD       LORazepam (ATIVAN) tablet 2 mg  2 mg Oral TID PRN Earney Navy, NP       Or   LORazepam (ATIVAN) injection 2 mg  2 mg Intramuscular TID PRN Earney Navy, NP       metFORMIN (GLUCOPHAGE-XR) 24 hr tablet 750 mg  750 mg Oral BID WC Onuoha, Josephine C, NP   750 mg at 10/28/22 1815   nicotine (NICODERM CQ - dosed in mg/24 hours) patch 14 mg  14 mg Transdermal Daily PRN Augusto Gamble, MD       nicotine polacrilex (NICORETTE) gum 2 mg  2 mg Oral PRN Augusto Gamble, MD       ondansetron [facility]) tablet 8 mg  8 mg Oral Q8H PRN Augusto Gamble, MD       polyethylene glycol (MIRALAX / GLYCOLAX) packet 17 g  17 g Oral Daily PRN Augusto Gamble, MD       senna (SENOKOT) tablet 8.6 mg  1 tablet Oral QHS PRN Augusto Gamble, MD       sertraline (ZOLOFT) tablet 100 mg  100 mg Oral Daily Earney Navy, NP  100 mg at 10/28/22 0754   traZODone (DESYREL) tablet 50 mg  50 mg Oral QHS Dahlia Byes C, NP   50 mg at 10/28/22 2149    Lab Results:  Results for orders placed or performed during the hospital encounter of 10/26/22 (from the past 48 hour(s))  Glucose, capillary     Status: Abnormal   Collection Time: 10/27/22 11:45 AM  Result Value Ref Range   Glucose-Capillary 371 (H) 70 - 99 mg/dL    Comment: Glucose reference range applies only to samples taken after fasting for at least 8 hours.  Glucose, capillary     Status: Abnormal   Collection Time: 10/27/22  5:09 PM  Result Value Ref Range   Glucose-Capillary 382 (H) 70 - 99 mg/dL    Comment: Glucose reference range applies only to samples taken after fasting for at least 8 hours.  Glucose, capillary     Status: Abnormal   Collection Time: 10/27/22  8:10 PM  Result Value Ref Range   Glucose-Capillary 227 (H) 70 - 99 mg/dL    Comment: Glucose reference range applies only to samples taken after fasting for at least 8 hours.  Glucose, capillary      Status: Abnormal   Collection Time: 10/28/22  5:51 AM  Result Value Ref Range   Glucose-Capillary 447 (H) 70 - 99 mg/dL    Comment: Glucose reference range applies only to samples taken after fasting for at least 8 hours.   Comment 1 Notify RN    Comment 2 Document in Chart   Glucose, capillary     Status: Abnormal   Collection Time: 10/28/22  6:36 AM  Result Value Ref Range   Glucose-Capillary 464 (H) 70 - 99 mg/dL    Comment: Glucose reference range applies only to samples taken after fasting for at least 8 hours.   Comment 1 Notify RN    Comment 2 Document in Chart   Lipid panel     Status: None   Collection Time: 10/28/22  6:51 AM  Result Value Ref Range   Cholesterol 136 0 - 200 mg/dL   Triglycerides 161 <096 mg/dL   HDL 51 >04 mg/dL   Total CHOL/HDL Ratio 2.7 RATIO   VLDL 23 0 - 40 mg/dL   LDL Cholesterol 62 0 - 99 mg/dL    Comment:        Total Cholesterol/HDL:CHD Risk Coronary Heart Disease Risk Table                     Men   Women  1/2 Average Risk   3.4   3.3  Average Risk       5.0   4.4  2 X Average Risk   9.6   7.1  3 X Average Risk  23.4   11.0        Use the calculated Patient Ratio above and the CHD Risk Table to determine the patient's CHD Risk.        ATP III CLASSIFICATION (LDL):  <100     mg/dL   Optimal  540-981  mg/dL   Near or Above                    Optimal  130-159  mg/dL   Borderline  191-478  mg/dL   High  >295     mg/dL   Very High Performed at [facility], [address] [place], [place] [address]   TSH  Status: None   Collection Time: 10/28/22  6:51 AM  Result Value Ref Range   TSH 2.268 0.350 - 4.500 uIU/mL    Comment: Performed by a 3rd Generation assay with a functional sensitivity of <=0.01 uIU/mL. Performed at [facility], [address], [place], [place] [address]   RPR     Status: None   Collection Time: 10/28/22  6:51 AM  Result Value Ref Range   RPR Ser Ql NON REACTIVE NON  REACTIVE    Comment: Performed at [facility], [address], [place], [place] [address]  Rapid HIV screen (HIV 1/2 Ab+Ag)     Status: None   Collection Time: 10/28/22  6:51 AM  Result Value Ref Range   HIV-1 P24 Antigen - HIV24 NON REACTIVE NON REACTIVE    Comment: (NOTE) Detection of p24 may be inhibited by biotin in the sample, causing false negative results in acute infection.    HIV 1/2 Antibodies NON REACTIVE NON REACTIVE   Interpretation (HIV Ag Ab)      A non reactive test result means that HIV 1 or HIV 2 antibodies and HIV 1 p24 antigen were not detected in the specimen.    Comment: Performed at [facility], [address], [place], [place] [address]  Hepatitis panel, acute     Status: Abnormal   Collection Time: 10/28/22  6:51 AM  Result Value Ref Range   Hepatitis B Surface Ag NON REACTIVE NON REACTIVE   HCV Ab Reactive (A) NON REACTIVE    Comment: (NOTE) The CDC recommends that a Reactive HCV antibody result be followed up  with a HCV Nucleic Acid Amplification test.     Hep A IgM NON REACTIVE NON REACTIVE   Hep B C IgM NON REACTIVE NON REACTIVE    Comment: Performed at [facility], [address], [place], [place] [address]  Hepatic function panel     Status: Abnormal   Collection Time: 10/28/22  6:51 AM  Result Value Ref Range   Total Protein 7.1 6.5 - 8.1 g/dL   Albumin 3.5 3.5 - 5.0 g/dL   AST 42 (H) 15 - 41 U/L   ALT 35 0 - 44 U/L   Alkaline Phosphatase 128 (H) 38 - 126 U/L   Total Bilirubin 0.4 0.3 - 1.2 mg/dL   Bilirubin, Direct 0.1 0.0 - 0.2 mg/dL   Indirect Bilirubin 0.3 0.3 - 0.9 mg/dL    Comment: Performed at [facility], [address] [place], [place] [address]  Basic metabolic panel     Status: Abnormal   Collection Time: 10/28/22  7:01 AM  Result Value Ref Range   Sodium 134 (L) 135 - 145 mmol/L   Potassium 4.2 3.5 - 5.1 mmol/L   Chloride 104 98 - 111 mmol/L   CO2 20 (L) 22 - 32 mmol/L    Glucose, Bld 495 (H) 70 - 99 mg/dL    Comment: Glucose reference range applies only to samples taken after fasting for at least 8 hours.   BUN 23 (H) 6 - 20 mg/dL   Creatinine, Ser 1.30 0.61 - 1.24 mg/dL   Calcium 8.5 (L) 8.9 - 10.3 mg/dL   GFR, Estimated >86 >57 mL/min    Comment: (NOTE) Calculated using the CKD-EPI Creatinine Equation (2021)    Anion gap 10 5 - 15    Comment: Performed at [facility], [address], [place], [place] [address]  Glucose,  capillary     Status: Abnormal   Collection Time: 10/28/22  7:14 AM  Result Value Ref Range   Glucose-Capillary 427 (H) 70 - 99 mg/dL    Comment: Glucose reference range applies only to samples taken after fasting for at least 8 hours.   Comment 1 Notify RN    Comment 2 Document in Chart   Glucose, capillary     Status: Abnormal   Collection Time: 10/28/22 11:45 AM  Result Value Ref Range   Glucose-Capillary 294 (H) 70 - 99 mg/dL    Comment: Glucose reference range applies only to samples taken after fasting for at least 8 hours.  Glucose, capillary     Status: Abnormal   Collection Time: 10/28/22  3:28 PM  Result Value Ref Range   Glucose-Capillary 288 (H) 70 - 99 mg/dL    Comment: Glucose reference range applies only to samples taken after fasting for at least 8 hours.  Glucose, capillary     Status: Abnormal   Collection Time: 10/28/22  5:17 PM  Result Value Ref Range   Glucose-Capillary 379 (H) 70 - 99 mg/dL    Comment: Glucose reference range applies only to samples taken after fasting for at least 8 hours.  Glucose, capillary     Status: Abnormal   Collection Time: 10/28/22  8:04 PM  Result Value Ref Range   Glucose-Capillary 173 (H) 70 - 99 mg/dL    Comment: Glucose reference range applies only to samples taken after fasting for at least 8 hours.  Glucose, capillary     Status: Abnormal   Collection Time: 10/29/22  6:27 AM  Result Value Ref Range   Glucose-Capillary 328 (H) 70 - 99 mg/dL     Comment: Glucose reference range applies only to samples taken after fasting for at least 8 hours.    Blood Alcohol level:  Lab Results  Component Value Date   ETH <10 10/19/2022    Metabolic Labs: Lab Results  Component Value Date   HGBA1C 9.9 (H) 10/19/2022   MPG 237 10/19/2022   MPG 306 10/02/2021   No results found for: "PROLACTIN" Lab Results  Component Value Date   CHOL 136 10/28/2022   TRIG 113 10/28/2022   HDL 51 10/28/2022   CHOLHDL 2.7 10/28/2022   VLDL 23 10/28/2022   LDLCALC 62 10/28/2022   LDLCALC 82 09/15/2020    Physical Findings: AIMS: No  CIWA:    COWS:     Psychiatric Specialty Exam: General Appearance:  Disheveled   Eye Contact:  Poor   Speech:  Slow; Garbled   Volume:  Decreased   Mood:  Depressed   Affect:  Congruent; Depressed; Flat   Thought Content:  Logical; WDL   Suicidal Thoughts:  Suicidal Thoughts: Yes, Passive SI Passive Intent and/or Plan: Without Plan   Homicidal Thoughts:  Homicidal Thoughts: No   Thought Process:  Coherent; Goal Directed   Orientation:  Full (Time, Place and Person)     Memory:  Immediate Fair; Recent Fair   Judgment:  Fair   Insight:  Fair   Concentration:  Fair   Recall:  Fair   Fund of Knowledge:  Good   Language:  Fair   Psychomotor Activity:  Psychomotor Activity: Normal   Assets:  Resilience; Desire for Improvement   Sleep:  Sleep: Fair    Review of Systems Review of Systems  Constitutional: Negative.   Respiratory: Negative.    Cardiovascular: Negative.   Gastrointestinal: Negative.   Genitourinary: Negative.  Vital Signs: Blood pressure 115/82, pulse 89, temperature 98.3 F (36.8 C), temperature source Oral, resp. rate 18, height 6' (1.829 m), weight 56.6 kg, SpO2 99%. Body mass index is 16.93 kg/m. Physical Exam Vitals and nursing note reviewed.  Constitutional:      Appearance: Normal appearance.  HENT:     Head: Normocephalic and  atraumatic.  Pulmonary:     Effort: Pulmonary effort is normal.  Musculoskeletal:     Comments: Ambulating in wheelchair  Neurological:     General: No focal deficit present.     Mental Status: He is alert.     Assets  Assets: Resilience; Desire for Improvement   Treatment Plan Summary: Daily contact with patient to assess and evaluate symptoms and progress in treatment and Medication management  Diagnoses / Active Problems: Major depressive disorder, recurrent severe without psychotic features (HCC) Principal Problem:   Major depressive disorder, recurrent severe without psychotic features (HCC) Active Problems:   Cachexia (HCC)   ASSESSMENT: Major depressive disorder, recurrent, severe, r/o substance induced mood disorder Social anxiety disorder Stimulant use disorder Cannabis use disorder Tobacco use disorder Hx alcohol use  Patient no longer feeling depressed or suicidal. Main issues right now are medical - will optimize diabetes regimen before sending patient out. Patient is also open to speaking with palliative care. Will also benefit from ID follow-up outpatient for HCV.  PLAN: Safety and Monitoring:  -- Voluntary admission to inpatient psychiatric unit for safety, stabilization and treatment  -- Daily contact with patient to assess and evaluate symptoms and progress in treatment  -- Patient's case to be discussed in multi-disciplinary team meeting  -- Observation Level : q15 minute checks  -- Vital signs:  q12 hours  -- Precautions: suicide, elopement, and assault  2. Interventions (medications, psychoeducation, etc):              -- Diabetes regimen: insulin, metformin. Diabetes coordinator consulted for recommendations - recs implemented             -- Infectious disease testing for high risk drug use. HCV Ab reactive, quantitative testing ordered. Given chronic HCV, will need outpatient f/u with ID  -- Palliative care consulted, appreciate  recommendations -- continue home gabapentin 400 mg three times a day for social anxiety, neuropathic pain             -- continue home sertraline 100 mg daily for depression and social anxiety             -- continue home aripiprazole 5 mg daily to augment treatment for depression             -- continue home trazodone 50 mg at bedtime for insomnia  -- Patient in need of nicotine replacement; nicotine polacrilex (gum) and nicotine patch 14 mg / 24 hours ordered. Smoking cessation encouraged  PRN medications for symptomatic management:              -- continue acetaminophen 650 mg every 6 hours as needed for mild to moderate pain, fever, and headaches              -- continue hydroxyzine 25 mg three times a day as needed for anxiety              -- continue bismuth subsalicylate 524 mg oral chewable tablet every 3 hours as needed for diarrhea / loose stools              -- continue senna 8.6 mg oral  at bedtime and polyethylene glycol 17 g oral daily as needed for mild to moderate constipation              -- continue ondansetron 8 mg every 8 hours as needed for nausea or vomiting              -- continue aluminum-magnesium hydroxide + simethicone 30 mL every 4 hours as needed for heartburn or indigestion  -- As needed agitation protocol in-place  The risks/benefits/side-effects/alternatives to the above medication were discussed in detail with the patient and time was given for questions. The patient consents to medication trial. FDA black box warnings, if present, were discussed.  The patient is agreeable with the medication plan, as above. We will monitor the patient's response to pharmacologic treatment, and adjust medications as necessary.  3. Routine and other pertinent labs:             -- Metabolic profile:  BMI: Body mass index is 16.93 kg/m.  Prolactin: No results found for: "PROLACTIN"  Lipid Panel: Lab Results  Component Value Date   CHOL 136 10/28/2022   TRIG 113  10/28/2022   HDL 51 10/28/2022   CHOLHDL 2.7 10/28/2022   VLDL 23 10/28/2022   LDLCALC 62 10/28/2022   LDLCALC 82 09/15/2020    HbgA1c: Hgb A1c MFr Bld (%)  Date Value  10/19/2022 9.9 (H)    TSH: TSH (uIU/mL)  Date Value  10/28/2022 2.268    EKG monitoring: QTc: 465 (10/25/2022)   4. Group Therapy:  -- Encouraged patient to participate in unit milieu and in scheduled group therapies   -- Short Term Goals: Ability to identify changes in lifestyle to reduce recurrence of condition, verbalize feelings, identify and develop effective coping behaviors, maintain clinical measurements within normal limits, and identify triggers associated with substance abuse/mental health issues will improve. Improvement in ability to disclose and discuss suicidal ideas, demonstrate self-control, and comply with prescribed medications.  -- Long Term Goals: Improvement in symptoms so as ready for discharge -- Patient is encouraged to participate in group therapy while admitted to the psychiatric unit. -- We will address other chronic and acute stressors, which contributed to the patient's Major depressive disorder, recurrent severe without psychotic features (HCC) in order to reduce the risk of self-harm at discharge.  5. Discharge Planning:   -- Social work and case management to assist with discharge planning and identification of hospital follow-up needs prior to discharge  -- Estimated LOS: 2 days  -- Discharge Concerns: Need to establish a safety plan; Medication compliance and effectiveness  -- Discharge Goals: Return home with outpatient referrals for mental health follow-up including medication management/psychotherapy  I certify that inpatient services furnished can reasonably be expected to improve the patient's condition.    I discussed my assessment, planned testing and intervention for the patient with Dr. Abbott Pao who agrees with my formulated course of action.  Signed: Augusto Gamble,  MD 10/29/2022, 7:28 AM

## 2022-10-29 NOTE — BHH Counselor (Signed)
CSW spoke with Dr. Phillips Odor concerning pt's current state. DR. Phillips Odor advised that the pt's foot wound be examined and that the pt be medically cleared prior to making a determination on pursuing housing options . CSW Will continue to monitor.

## 2022-10-29 NOTE — Plan of Care (Signed)
Nurse discussed diabetes, diet and coping skills with patient.

## 2022-10-29 NOTE — Plan of Care (Addendum)
D:  Patient uses wheelchair in hallway.  Patient denied SI and HI, contracts for safety.  Denied A/V hallucinations.  Patient has been in dayroom watching tv with peers, attending groups. Patient has been cooperative and pleasant today. A:  Medications administered per MD orders.  Emotional support and encouragement given patient. R:  Safety maintained with 15 minute checks.

## 2022-10-29 NOTE — Group Note (Signed)
LCSW Group Therapy Note  Group Date: 10/29/2022 Start Time: 1100 End Time: 1145   Type of Therapy and Topic:  Group Therapy - Healthy vs Unhealthy Coping Skills  Participation Level:  Active   Description of Group The focus of this group was to determine what unhealthy coping techniques typically are used by group members and what healthy coping techniques would be helpful in coping with various problems. Patients were guided in becoming aware of the differences between healthy and unhealthy coping techniques. Patients were asked to identify 2-3 healthy coping skills they would like to learn to use more effectively.  Therapeutic Goals Patients learned that coping is what human beings do all day long to deal with various situations in their lives Patients defined and discussed healthy vs unhealthy coping techniques Patients identified their preferred coping techniques and identified whether these were healthy or unhealthy Patients determined 2-3 healthy coping skills they would like to become more familiar with and use more often. Patients provided support and ideas to each other   Summary of Patient Progress:  During group, Antonio [name] expressed interest. Patient proved open to input from peers and feedback from CSW. Patient demonstrated positive insight into the subject matter, was respectful of peers, and participated throughout the entire session.   Therapeutic Modalities Cognitive Behavioral Therapy Motivational Interviewing  Starleen Arms, Theresia Majors 10/29/2022  1:25 PM

## 2022-10-29 NOTE — Progress Notes (Signed)
   10/29/22 0600  15 Minute Checks  Location Bedroom  Visual Appearance Calm  Behavior Sleeping  Sleep (Behavioral Health Patients Only)  Calculate sleep? (Click Yes once per 24 hr at 0600 safety check) Yes  Documented sleep last 24 hours 9.75

## 2022-10-29 NOTE — Plan of Care (Signed)
  Problem: Activity: Goal: Interest or engagement in activities will improve Outcome: Progressing   Problem: Safety: Goal: Periods of time without injury will increase Outcome: Progressing   

## 2022-10-29 NOTE — Progress Notes (Signed)
   10/29/22 2343  Psych Admission Type (Psych Patients Only)  Admission Status Voluntary  Psychosocial Assessment  Patient Complaints Anxiety;Depression  Eye Contact Fair  Facial Expression Sad  Affect Depressed;Flat  Speech Logical/coherent  Interaction Guarded;Minimal  Motor Activity Tremors;Unsteady  Appearance/Hygiene Body odor;Disheveled;Poor hygiene  Behavior Characteristics Cooperative;Anxious;Irritable  Mood Depressed;Anxious  Thought Process  Coherency WDL  Content WDL  Delusions None reported or observed  Perception WDL  Hallucination None reported or observed  Judgment Impaired  Confusion None  Danger to Self  Current suicidal ideation? Denies  Self-Injurious Behavior No self-injurious ideation or behavior indicators observed or expressed   Agreement Not to Harm Self Yes  Description of Agreement verbal  Danger to Others  Danger to Others None reported or observed

## 2022-10-29 NOTE — Group Note (Signed)
Recreation Therapy Group Note   Group Topic:Animal Assisted Therapy   Group Date: 10/29/2022 Start Time: 9147 End Time: 1034 Facilitators: Yanett Conkright-McCall, LRT,CTRS Location: [address] Dayroom   Animal-Assisted Activity (AAA) Program Checklist/Progress Notes Patient Eligibility Criteria Checklist & Daily Group note for Rec Tx Intervention  AAA/T Program Assumption of Risk Form signed by Patient/ or Parent Legal Guardian Yes  Patient understands his/her participation is voluntary Yes   Affect/Mood: N/A   Participation Level: Did not attend    Clinical Observations/Individualized Feedback:     Plan: Continue to engage patient in RT group sessions 2-3x/week.   Antonio [name], LRT,CTRS 10/29/2022 1:08 PM

## 2022-10-29 NOTE — Progress Notes (Signed)
   10/28/22 2150  Psych Admission Type (Psych Patients Only)  Admission Status Voluntary  Psychosocial Assessment  Patient Complaints Depression  Eye Contact Fair  Facial Expression Sad  Affect Depressed;Flat  Speech Logical/coherent;Soft  Interaction Minimal  Motor Activity Slow  Appearance/Hygiene Disheveled;Poor hygiene  Behavior Characteristics Cooperative;Calm  Mood Depressed  Thought Process  Coherency WDL  Content WDL  Delusions None reported or observed  Perception WDL  Hallucination None reported or observed  Judgment Impaired  Confusion None  Danger to Self  Current suicidal ideation? Passive  Self-Injurious Behavior No self-injurious ideation or behavior indicators observed or expressed   Agreement Not to Harm Self Yes  Description of Agreement verbal  Danger to Others  Danger to Others None reported or observed   Pt was offered support and encouragement. Pt was given scheduled medications. Q 15 minute checks were done for safety.  Pt attended group and interacts well with peers and staff.  Pt has no complaints. Pt receptive to treatment and safety maintained on unit.

## 2022-10-29 NOTE — [facility] Group Notes (Signed)
[facility] Group Notes:  (Nursing/MHT/Case Management/Adjunct)  Date:  10/29/2022  Time:  10:20 PM  Type of Therapy:   Wrap-up group  Participation Level:  Active  Participation Quality:  Appropriate  Affect:  Appropriate  Cognitive:  Appropriate  Insight:  Appropriate  Engagement in Group:  Engaged  Modes of Intervention:  Education  Summary of Progress/Problems: Pt goal to attend groups. Pt reports attending three groups. Day 7/10.   Antonio [name] 10/29/2022, 10:20 PM

## 2022-10-30 DIAGNOSIS — F332 Major depressive disorder, recurrent severe without psychotic features: Principal | ICD-10-CM

## 2022-10-30 LAB — GC/CHLAMYDIA PROBE AMP (~~LOC~~) NOT AT ARMC
Chlamydia: NEGATIVE
Comment: NEGATIVE
Comment: NORMAL
Neisseria Gonorrhea: NEGATIVE

## 2022-10-30 LAB — GLUCOSE, CAPILLARY
Glucose-Capillary: 346 mg/dL — ABNORMAL HIGH (ref 70–99)
Glucose-Capillary: 259 mg/dL — ABNORMAL HIGH (ref 70–99)
Glucose-Capillary: 94 mg/dL (ref 70–99)
Glucose-Capillary: 218 mg/dL — ABNORMAL HIGH (ref 70–99)

## 2022-10-30 MED ORDER — BACITRACIN ZINC 500 UNIT/GM EX OINT
TOPICAL_OINTMENT | CUTANEOUS | Status: AC
  Administered 2022-10-30: 1
  Filled 2022-10-30: qty 0.9

## 2022-10-30 NOTE — Progress Notes (Signed)
[facility] MD Progress Note  10/30/2022 9:00 AM Antonio [name]  MRN:  [id number]  Principal Problem: Major depressive disorder, recurrent severe without psychotic features (HCC) Diagnosis: Principal Problem:   Major depressive disorder, recurrent severe without psychotic features (HCC) Active Problems:   Cachexia (HCC)  Reason for Admission:  Antonio [name] is a 52 y.o., male with a past psychiatric history of major depressive disorder and social anxiety disorder, substance use history of stimulant use disorder, tobacco use disorder, and tobacco use disorder, and significant PMHx of uncontrolled diabetes  who presents to the [facility] Voluntary from  Emergency Department  for evaluation and management of suicidal ideations (admitted on 10/26/2022, total  LOS: 4 days )  Chart Review from last 24 hours:  The patient's chart was reviewed and nursing notes were reviewed. The patient's case was discussed in multidisciplinary team meeting.   - Overnight events to report per chart review / staff report:  did not want to speak to RN this morning, otherwise behaving appropriately on unit - Patient received all scheduled medications - Patient did not receive any PRN medications  Information Obtained Today During Patient Interview: The patient was seen and evaluated on the unit. On assessment today the patient reports feeling good. He denies feeling depressed.  He endorses feeling anxious and when I asked what about, he says "anxious to go home." He confirms [place] and would like to get out of the hospital.  Patient endorses good sleep; endorses good appetite.  Patient does not endorse any side-effects they attribute to medications.  Psychotropic medications: Current Gabapentin 600 mg twice a day - patient reportedly taking consistently, reports good response Sertraline 100 mg daily - patient reportedly taking consistently, reports good response Trazodone 10 mg daily at  bedtime ("the white pill") - patient reportedly taking consistently, reports good response Aripiprazole daily, cannot remember dose - patient reportedly taking consistently, reports good response   Past Denies other medication trials   Allergies: endorses the following medication allergies with resulting symptoms: acetaminophen   Substance Use History: Alcohol: sober for 20 years, 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    --------   Tobacco: endorses, current, smokes 1 packs per day since age 20 Cannabis (marijuana): endorses smoking, last time 3 weeks ago Cocaine: tried in past and has since quit, no current use Methamphetamines:  endorses using, last time 3 weeks ago Psilocybin (mushrooms): never tried Ecstasy (MDMA / molly): never tried Opiates (fentanyl / heroin): tried in past, no current use Benzos (Xanax, Klonopin): never tried IV drug use: endorses ("it's how I use meth") Prescribed meds abuse: denies   History of detox: attempted detox from methamphetamines and cannabis with good results History of rehab: denies  Past Medical/Surgical History:  Medical Diagnoses: throat cancer, diabetes, liver issues Home Rx: insulin Prior Hosp: multiple times Prior Surgeries / non-head trauma: ankle surgery   Head trauma: denies LOC: endorses prior to this admission Concussions: denies Seizures: denies   Last menstrual period and contraceptives: N/A   Family History:  Medical: unknown Psych: unknown Suicide: unknown Homicide: unknown Substance use family hx: unknown   Social History:  Place of birth and grew up where: born in [place] Abuse: no history of abuse Marital Status: single Sexual orientation: straight Children: denies Employment: unemployed Housing: unhoused Finances: no reliable source of income Legal: no Special educational needs teacher: never served Consulting civil engineer: denies owning any firearms Pills stockpile: none  Current Medications: Current Facility-Administered Medications   Medication Dose Route Frequency Provider Last  Rate Last Admin   alum & mag hydroxide-simeth (MAALOX/MYLANTA) 200-200-20 MG/5ML suspension 30 mL  30 mL Oral Q4H PRN Augusto Gamble, MD       ARIPiprazole (ABILIFY) tablet 5 mg  5 mg Oral Daily Onuoha, Josephine C, NP   5 mg at 10/29/22 0900   bismuth subsalicylate (PEPTO BISMOL) chewable tablet 524 mg  524 mg Oral Q3H PRN Augusto Gamble, MD       diphenhydrAMINE (BENADRYL) capsule 50 mg  50 mg Oral TID PRN Earney Navy, NP       Or   diphenhydrAMINE (BENADRYL) injection 50 mg  50 mg Intramuscular TID PRN Earney Navy, NP       feeding supplement (ENSURE ENLIVE / ENSURE PLUS) liquid 237 mL  237 mL Oral BID BM Bobbitt, Shalon E, NP   237 mL at 10/29/22 1610   gabapentin (NEURONTIN) capsule 400 mg  400 mg Oral TID Augusto Gamble, MD   400 mg at 10/29/22 1609   haloperidol (HALDOL) tablet 5 mg  5 mg Oral TID PRN Earney Navy, NP       Or   haloperidol lactate (HALDOL) injection 5 mg  5 mg Intramuscular TID PRN Earney Navy, NP       hydrOXYzine (ATARAX) tablet 25 mg  25 mg Oral TID PRN Augusto Gamble, MD   25 mg at 10/28/22 1144   ibuprofen (ADVIL) tablet 800 mg  800 mg Oral Q6H PRN Dahlia Byes C, NP   800 mg at 10/28/22 1141   insulin aspart (novoLOG) injection 0-15 Units  0-15 Units Subcutaneous TID WC Dahlia Byes C, NP   11 Units at 10/30/22 0720   insulin aspart (novoLOG) injection 0-5 Units  0-5 Units Subcutaneous QHS Dahlia Byes C, NP   3 Units at 10/29/22 2117   insulin aspart (novoLOG) injection 5 Units  5 Units Subcutaneous TID WC Massengill, Harrold Donath, MD   5 Units at 10/30/22 0721   insulin glargine-yfgn (SEMGLEE) injection 15 Units  15 Units Subcutaneous Daily Massengill, Harrold Donath, MD   15 Units at 10/29/22 0900   LORazepam (ATIVAN) tablet 2 mg  2 mg Oral TID PRN Earney Navy, NP       Or   LORazepam (ATIVAN) injection 2 mg  2 mg Intramuscular TID PRN Earney Navy, NP       metFORMIN  (GLUCOPHAGE-XR) 24 hr tablet 750 mg  750 mg Oral BID WC Onuoha, Josephine C, NP   750 mg at 10/29/22 1727   nicotine (NICODERM CQ - dosed in mg/24 hours) patch 14 mg  14 mg Transdermal Daily PRN Augusto Gamble, MD       nicotine polacrilex (NICORETTE) gum 2 mg  2 mg Oral PRN Augusto Gamble, MD       ondansetron [facility]) tablet 8 mg  8 mg Oral Q8H PRN Augusto Gamble, MD       polyethylene glycol (MIRALAX / GLYCOLAX) packet 17 g  17 g Oral Daily PRN Augusto Gamble, MD       senna (SENOKOT) tablet 8.6 mg  1 tablet Oral QHS PRN Augusto Gamble, MD       sertraline (ZOLOFT) tablet 100 mg  100 mg Oral Daily Onuoha, Josephine C, NP   100 mg at 10/29/22 0900   traZODone (DESYREL) tablet 50 mg  50 mg Oral QHS Dahlia Byes C, NP   50 mg at 10/29/22 2117    Lab Results:  Results for orders placed or performed during the  hospital encounter of 10/26/22 (from the past 48 hour(s))  Glucose, capillary     Status: Abnormal   Collection Time: 10/28/22 11:45 AM  Result Value Ref Range   Glucose-Capillary 294 (H) 70 - 99 mg/dL    Comment: Glucose reference range applies only to samples taken after fasting for at least 8 hours.  Glucose, capillary     Status: Abnormal   Collection Time: 10/28/22  3:28 PM  Result Value Ref Range   Glucose-Capillary 288 (H) 70 - 99 mg/dL    Comment: Glucose reference range applies only to samples taken after fasting for at least 8 hours.  Glucose, capillary     Status: Abnormal   Collection Time: 10/28/22  5:17 PM  Result Value Ref Range   Glucose-Capillary 379 (H) 70 - 99 mg/dL    Comment: Glucose reference range applies only to samples taken after fasting for at least 8 hours.  Glucose, capillary     Status: Abnormal   Collection Time: 10/28/22  8:04 PM  Result Value Ref Range   Glucose-Capillary 173 (H) 70 - 99 mg/dL    Comment: Glucose reference range applies only to samples taken after fasting for at least 8 hours.  Glucose, capillary     Status: Abnormal   Collection Time:  10/29/22  6:27 AM  Result Value Ref Range   Glucose-Capillary 328 (H) 70 - 99 mg/dL    Comment: Glucose reference range applies only to samples taken after fasting for at least 8 hours.  Glucose, capillary     Status: Abnormal   Collection Time: 10/29/22 11:59 AM  Result Value Ref Range   Glucose-Capillary 230 (H) 70 - 99 mg/dL    Comment: Glucose reference range applies only to samples taken after fasting for at least 8 hours.   Comment 1 Notify RN    Comment 2 Document in Chart   Glucose, capillary     Status: Abnormal   Collection Time: 10/29/22  8:12 PM  Result Value Ref Range   Glucose-Capillary 270 (H) 70 - 99 mg/dL    Comment: Glucose reference range applies only to samples taken after fasting for at least 8 hours.  Glucose, capillary     Status: Abnormal   Collection Time: 10/30/22  6:14 AM  Result Value Ref Range   Glucose-Capillary 346 (H) 70 - 99 mg/dL    Comment: Glucose reference range applies only to samples taken after fasting for at least 8 hours.    Blood Alcohol level:  Lab Results  Component Value Date   ETH <10 10/19/2022    Metabolic Labs: Lab Results  Component Value Date   HGBA1C 9.9 (H) 10/19/2022   MPG 237 10/19/2022   MPG 306 10/02/2021   No results found for: "PROLACTIN" Lab Results  Component Value Date   CHOL 136 10/28/2022   TRIG 113 10/28/2022   HDL 51 10/28/2022   CHOLHDL 2.7 10/28/2022   VLDL 23 10/28/2022   LDLCALC 62 10/28/2022   LDLCALC 82 09/15/2020    Physical Findings: AIMS: No  CIWA:    COWS:     Psychiatric Specialty Exam: General Appearance:  Disheveled   Eye Contact:  Fair   Speech:  Slow   Volume:  Decreased   Mood:  -- ("I feel good")   Affect:  Depressed; Non-Congruent; Flat   Thought Content:  WDL   Suicidal Thoughts:  Suicidal Thoughts: No SI Active Intent and/or Plan: Without Intent SI Passive Intent and/or Plan: Without Intent  Homicidal Thoughts:  Homicidal Thoughts: No   Thought  Process:  Coherent; Goal Directed; Linear   Orientation:  Full (Time, Place and Person)     Memory:  Immediate Good; Recent Good; Remote Good   Judgment:  Good   Insight:  Good   Concentration:  Good   Recall:  Good   Fund of Knowledge:  Good   Language:  Good   Psychomotor Activity:  Psychomotor Activity: Normal   Assets:  Resilience   Sleep:  Sleep: Good    Review of Systems Review of Systems  Constitutional: Negative.   Respiratory: Negative.    Cardiovascular: Negative.   Gastrointestinal: Negative.   Genitourinary: Negative.     Vital Signs: Blood pressure 94/80, pulse (!) 106, temperature 98.2 F (36.8 C), temperature source Oral, resp. rate 16, height 6' (1.829 m), weight 56.6 kg, SpO2 99%. Body mass index is 16.93 kg/m. Physical Exam Vitals and nursing note reviewed.  Constitutional:      Appearance: Normal appearance.  HENT:     Head: Normocephalic and atraumatic.  Pulmonary:     Effort: Pulmonary effort is normal.  Musculoskeletal:     Comments: Ambulating in wheelchair  Neurological:     General: No focal deficit present.     Mental Status: He is alert.     Assets  Assets: Resilience   Treatment Plan Summary: Daily contact with patient to assess and evaluate symptoms and progress in treatment and Medication management  Diagnoses / Active Problems: Major depressive disorder, recurrent severe without psychotic features (HCC) Principal Problem:   Major depressive disorder, recurrent severe without psychotic features (HCC) Active Problems:   Cachexia (HCC)   ASSESSMENT: Major depressive disorder, recurrent, severe, r/o substance induced mood disorder Social anxiety disorder Stimulant use disorder Cannabis use disorder Tobacco use disorder Hx alcohol use  Patient no longer feeling depressed or suicidal. Main issues right now are medical - will optimize diabetes regimen before sending patient out. Patient is also open to  speaking with palliative care. Will also benefit from ID follow-up outpatient for HCV.  PLAN: Safety and Monitoring:  -- Voluntary admission to inpatient psychiatric unit for safety, stabilization and treatment  -- Daily contact with patient to assess and evaluate symptoms and progress in treatment  -- Patient's case to be discussed in multi-disciplinary team meeting  -- Observation Level : q15 minute checks  -- Vital signs:  q12 hours  -- Precautions: suicide, elopement, and assault  2. Interventions (medications, psychoeducation, etc):              -- Diabetes regimen: insulin, metformin. Diabetes coordinator consulted for recommendations - recs implemented             -- Infectious disease testing for high risk drug use. HCV Ab reactive, quantitative testing ordered. Given chronic HCV, will need outpatient f/u with ID  -- Palliative care consulted, appreciate recommendations -- continue home gabapentin 400 mg three times a day for social anxiety, neuropathic pain             -- continue home sertraline 100 mg daily for depression and social anxiety             -- continue home aripiprazole 5 mg daily to augment treatment for depression             -- continue home trazodone 50 mg at bedtime for insomnia  -- Patient in need of nicotine replacement; nicotine polacrilex (gum) and nicotine patch 14 mg / 24 hours  ordered. Smoking cessation encouraged  PRN medications for symptomatic management:              -- continue acetaminophen 650 mg every 6 hours as needed for mild to moderate pain, fever, and headaches              -- continue hydroxyzine 25 mg three times a day as needed for anxiety              -- continue bismuth subsalicylate 524 mg oral chewable tablet every 3 hours as needed for diarrhea / loose stools              -- continue senna 8.6 mg oral at bedtime and polyethylene glycol 17 g oral daily as needed for mild to moderate constipation              -- continue ondansetron 8 mg  every 8 hours as needed for nausea or vomiting              -- continue aluminum-magnesium hydroxide + simethicone 30 mL every 4 hours as needed for heartburn or indigestion  -- As needed agitation protocol in-place  The risks/benefits/side-effects/alternatives to the above medication were discussed in detail with the patient and time was given for questions. The patient consents to medication trial. FDA black box warnings, if present, were discussed.  The patient is agreeable with the medication plan, as above. We will monitor the patient's response to pharmacologic treatment, and adjust medications as necessary.  3. Routine and other pertinent labs:             -- Metabolic profile:  BMI: Body mass index is 16.93 kg/m.  Prolactin: No results found for: "PROLACTIN"  Lipid Panel: Lab Results  Component Value Date   CHOL 136 10/28/2022   TRIG 113 10/28/2022   HDL 51 10/28/2022   CHOLHDL 2.7 10/28/2022   VLDL 23 10/28/2022   LDLCALC 62 10/28/2022   LDLCALC 82 09/15/2020    HbgA1c: Hgb A1c MFr Bld (%)  Date Value  10/19/2022 9.9 (H)    TSH: TSH (uIU/mL)  Date Value  10/28/2022 2.268    EKG monitoring: QTc: 465 (10/25/2022)   4. Group Therapy:  -- Encouraged patient to participate in unit milieu and in scheduled group therapies   -- Short Term Goals: Ability to identify changes in lifestyle to reduce recurrence of condition, verbalize feelings, identify and develop effective coping behaviors, maintain clinical measurements within normal limits, and identify triggers associated with substance abuse/mental health issues will improve. Improvement in ability to disclose and discuss suicidal ideas, demonstrate self-control, and comply with prescribed medications.  -- Long Term Goals: Improvement in symptoms so as ready for discharge -- Patient is encouraged to participate in group therapy while admitted to the psychiatric unit. -- We will address other chronic and acute  stressors, which contributed to the patient's Major depressive disorder, recurrent severe without psychotic features (HCC) in order to reduce the risk of self-harm at discharge.  5. Discharge Planning:   -- Social work and case management to assist with discharge planning and identification of hospital follow-up needs prior to discharge  -- Estimated LOS: 2 days  -- Discharge Concerns: Need to establish a safety plan; Medication compliance and effectiveness  -- Discharge Goals: Return home with outpatient referrals for mental health follow-up including medication management/psychotherapy  I certify that inpatient services furnished can reasonably be expected to improve the patient's condition.    I discussed my assessment, planned  testing and intervention for the patient with Dr. Abbott Pao who agrees with my formulated course of action.  Signed: Augusto Gamble, MD 10/30/2022, 9:00 AM

## 2022-10-30 NOTE — Plan of Care (Signed)
Brief Psych Note: Spoke to Dr. Earlene Plater at [facility] ED to evaluate patient's chronic foot wound and obtain medical clearance before we find placement for patient.  Nursing staff and [facility] notified.  Patient is cleared psychiatrically but may come back to [facility] after medical clearance from ED for assistance in placement. Dr. Earlene Plater will see patient and provide recommendations afterwards.  Signed: Augusto Gamble, MD 10/30/2022 10:58 AM

## 2022-10-30 NOTE — Discharge Instructions (Signed)
There were no signs of infection of the wound right now.  He should keep dressed and clean.  If he develops fever, redness around the site or severe pain he should be reevaluated.

## 2022-10-30 NOTE — ED Triage Notes (Signed)
Pt arrives from [facility] with sitter who states they were sent for clearance due to wound to pts left foot. Pt states it has been there for 7-8 months and has 10/10 pain.

## 2022-10-30 NOTE — ED Provider Notes (Signed)
[place] EMERGENCY DEPARTMENT AT [facility] Provider Note   CSN: [id number] Arrival date & time: 10/30/22  1142     History  Chief Complaint  Patient presents with   foot wound    Antonio [name] is a 52 y.o. male.  HPI 52 year old male history of IV drug use, alcoholic hepatitis, diabetes presenting for foot wound.  I spoke with Dr. Jodie Echevaria at the behavioral health hospital.  They note he has a foot wound and was concerned and felt like it needed to be evaluated.  They are unsure how long it has been there.  He is currently at the behavioral hospital voluntarily and they plan to take him back there afterwards.  He states he has a wound on his left medial ankle for 7 months.  It has gradually worsened.  It is mildly painful but not acutely worse today.  He has not had any redness, purulence.  No specific trauma.  No fevers or chills.  He has been taking all of his medications at the psychiatry hospital.     Home Medications Prior to Admission medications   Medication Sig Start Date End Date Taking? Authorizing Provider  ARIPiprazole (ABILIFY) 10 MG tablet Take 10 mg by mouth daily.    [provider]  gabapentin (NEURONTIN) 600 MG tablet Take 600 mg by mouth 4 (four) times daily.    [provider]  HUMALOG KWIKPEN 100 UNIT/ML KwikPen Inject 2-8 Units into the skin See admin instructions. Inject 2-8 units into the skin four times a day, per sliding scale: BGL 140-180 = 2 units; 181-240 = 4 units; 241-300 = 6 units; 301-350 = 8 units    [provider]  LANTUS SOLOSTAR 100 UNIT/ML Solostar Pen Inject 12 Units into the skin 2 (two) times daily.    [provider]  metFORMIN (GLUCOPHAGE-XR) 750 MG 24 hr tablet Take 1 tablet (750 mg total) by mouth 2 (two) times daily with a meal. 10/19/22   Harriet Pho, Barnetta Chapel, PA-C  sertraline (ZOLOFT) 100 MG tablet Take 1 tablet (100 mg total) by mouth daily. 09/18/20   [name], NP  traZODone (DESYREL)  50 MG tablet Take 50 mg by mouth at bedtime.    [provider]      Allergies    Acetaminophen    Review of Systems   Review of Systems Review of systems completed and notable as per HPI.  ROS otherwise negative.   Physical Exam Updated Vital Signs BP 101/80   Pulse 90   Temp 97.8 F (36.6 C) (Oral)   Resp 16   Ht 6' (1.829 m)   Wt 56.6 kg   SpO2 99%   BMI 16.93 kg/m  Physical Exam Vitals and nursing note reviewed.  Constitutional:      General: He is not in acute distress.    Appearance: He is well-developed.  HENT:     Head: Normocephalic and atraumatic.  Eyes:     Conjunctiva/sclera: Conjunctivae normal.  Cardiovascular:     Rate and Rhythm: Normal rate and regular rhythm.     Heart sounds: No murmur heard. Pulmonary:     Effort: Pulmonary effort is normal. No respiratory distress.     Breath sounds: Normal breath sounds.  Abdominal:     Palpations: Abdomen is soft.     Tenderness: There is no abdominal tenderness.  Musculoskeletal:        General: No swelling.     Cervical back: Neck supple.  Comments: Small area of skin breakdown and wound to the medial left malleolus.  No tenderness.  No discharge.  Does not probe to bone.  Palpable DP and PT pulse.  Normal range of motion of the ankle.  He has no fluctuance, erythema, warmth.  Skin:    General: Skin is warm and dry.     Capillary Refill: Capillary refill takes less than 2 seconds.  Neurological:     Mental Status: He is alert.  Psychiatric:        Mood and Affect: Mood normal.     ED Results / Procedures / Treatments   Labs (all labs ordered are listed, but only abnormal results are displayed) Labs Reviewed  GLUCOSE, CAPILLARY - Abnormal; Notable for the following components:      Result Value   Glucose-Capillary 289 (*)    All other components within normal limits  GLUCOSE, CAPILLARY - Abnormal; Notable for the following components:   Glucose-Capillary 350 (*)    All other  components within normal limits  GLUCOSE, CAPILLARY - Abnormal; Notable for the following components:   Glucose-Capillary 371 (*)    All other components within normal limits  GLUCOSE, CAPILLARY - Abnormal; Notable for the following components:   Glucose-Capillary 382 (*)    All other components within normal limits  HEPATITIS PANEL, ACUTE - Abnormal; Notable for the following components:   HCV Ab Reactive (*)    All other components within normal limits  HEPATIC FUNCTION PANEL - Abnormal; Notable for the following components:   AST 42 (*)    Alkaline Phosphatase 128 (*)    All other components within normal limits  GLUCOSE, CAPILLARY - Abnormal; Notable for the following components:   Glucose-Capillary 227 (*)    All other components within normal limits  GLUCOSE, CAPILLARY - Abnormal; Notable for the following components:   Glucose-Capillary 447 (*)    All other components within normal limits  GLUCOSE, CAPILLARY - Abnormal; Notable for the following components:   Glucose-Capillary 464 (*)    All other components within normal limits  BASIC METABOLIC PANEL - Abnormal; Notable for the following components:   Sodium 134 (*)    CO2 20 (*)    Glucose, Bld 495 (*)    BUN 23 (*)    Calcium 8.5 (*)    All other components within normal limits  GLUCOSE, CAPILLARY - Abnormal; Notable for the following components:   Glucose-Capillary 427 (*)    All other components within normal limits  GLUCOSE, CAPILLARY - Abnormal; Notable for the following components:   Glucose-Capillary 294 (*)    All other components within normal limits  GLUCOSE, CAPILLARY - Abnormal; Notable for the following components:   Glucose-Capillary 288 (*)    All other components within normal limits  GLUCOSE, CAPILLARY - Abnormal; Notable for the following components:   Glucose-Capillary 379 (*)    All other components within normal limits  GLUCOSE, CAPILLARY - Abnormal; Notable for the following components:    Glucose-Capillary 173 (*)    All other components within normal limits  GLUCOSE, CAPILLARY - Abnormal; Notable for the following components:   Glucose-Capillary 328 (*)    All other components within normal limits  GLUCOSE, CAPILLARY - Abnormal; Notable for the following components:   Glucose-Capillary 230 (*)    All other components within normal limits  GLUCOSE, CAPILLARY - Abnormal; Notable for the following components:   Glucose-Capillary 270 (*)    All other components within normal limits  GLUCOSE,  CAPILLARY - Abnormal; Notable for the following components:   Glucose-Capillary 346 (*)    All other components within normal limits  LIPID PANEL  TSH  RPR  RAPID HIV SCREEN (HIV 1/2 AB+AG)  HCV RNA QUANT RFLX ULTRA OR GENOTYP  GC/CHLAMYDIA PROBE AMP ([place]) NOT AT [facility]    EKG None  Radiology No results found.  Procedures Procedures    Medications Ordered in ED Medications  ARIPiprazole (ABILIFY) tablet 5 mg (5 mg Oral Given 10/30/22 0942)  ibuprofen (ADVIL) tablet 800 mg (800 mg Oral Given 10/28/22 1141)  insulin aspart (novoLOG) injection 0-15 Units (11 Units Subcutaneous Given 10/30/22 0720)  insulin aspart (novoLOG) injection 0-5 Units (3 Units Subcutaneous Given 10/29/22 2117)  metFORMIN (GLUCOPHAGE-XR) 24 hr tablet 750 mg (750 mg Oral Given 10/30/22 0942)  sertraline (ZOLOFT) tablet 100 mg (100 mg Oral Given 10/30/22 0942)  traZODone (DESYREL) tablet 50 mg (50 mg Oral Given 10/29/22 2117)  haloperidol (HALDOL) tablet 5 mg (has no administration in time range)    Or  haloperidol lactate (HALDOL) injection 5 mg (has no administration in time range)  LORazepam (ATIVAN) tablet 2 mg (has no administration in time range)    Or  LORazepam (ATIVAN) injection 2 mg (has no administration in time range)  diphenhydrAMINE (BENADRYL) capsule 50 mg (has no administration in time range)    Or  diphenhydrAMINE (BENADRYL) injection 50 mg (has no administration in time range)   feeding supplement (ENSURE ENLIVE / ENSURE PLUS) liquid 237 mL (237 mLs Oral Not Given 10/30/22 1105)  alum & mag hydroxide-simeth (MAALOX/MYLANTA) 200-200-20 MG/5ML suspension 30 mL (has no administration in time range)  bismuth subsalicylate (PEPTO BISMOL) chewable tablet 524 mg (has no administration in time range)  hydrOXYzine (ATARAX) tablet 25 mg (25 mg Oral Given 10/28/22 1144)  ondansetron (ZOFRAN) tablet 8 mg (has no administration in time range)  polyethylene glycol (MIRALAX / GLYCOLAX) packet 17 g (has no administration in time range)  senna (SENOKOT) tablet 8.6 mg (has no administration in time range)  gabapentin (NEURONTIN) capsule 400 mg (400 mg Oral Given 10/30/22 0942)  nicotine polacrilex (NICORETTE) gum 2 mg (has no administration in time range)  nicotine (NICODERM CQ - dosed in mg/24 hours) patch 14 mg (has no administration in time range)  sodium chloride 0.9 % nebulizer solution (has no administration in time range)  insulin glargine-yfgn (SEMGLEE) injection 15 Units (15 Units Subcutaneous Given 10/30/22 0941)  insulin aspart (novoLOG) injection 5 Units (5 Units Subcutaneous Given 10/30/22 0721)  bacitracin 500 UNIT/GM ointment (1 Application  Given 10/30/22 1216)    ED Course/ Medical Decision Making/ A&P                             Medical Decision Making  Medical Decision Making:   Antonio [name] is a 52 y.o. male who presented to the ED today with chronic left lower extremity wound.  He has small area of skin breakdown to the left medial malleolus.  He has been there for 7 months and has not acutely worsened.  He had x-ray July 13 of the left foot which did not show any signs of bony erosion or osteomyelitis and he has no probe to bone here.  I suspect it is skin breakdown from frequent walking and rubbing of the area.  Will dress the wound here and encouraged him to continue wound care at the psychiatric facility.  He has no signs of  cellulitis, abscess or systemic  infection.  Has been taking his insulin at the hospital and recent had lab work here which was reassuring.  Given he has no acute symptoms I do not think repeat lab work is indicated at this time.  I did give him strict return precautions for signs of infection including purulence, redness, severe pain or fever.  I attempted to call the provider Dr. Mohammed Kindle multiple times but was unable to reach him.  I did send him a message through secure chat.  I previously talked to him earlier and they were fine with him going back to the behavioral hospital, he is currently not under IVC and is voluntary.   Patient's presentation is most consistent with acute, uncomplicated illness.           Final Clinical Impression(s) / ED Diagnoses Final diagnoses:  Wound of foot    Rx / DC Orders ED Discharge Orders          Ordered    Ambulatory referral to Infectious Disease       Comments: Chronic HCV   10/29/22 0957              Laurence Spates, MD 10/30/22 1337

## 2022-10-30 NOTE — Plan of Care (Signed)
  Problem: Education: Goal: Knowledge of [facility] Education information/materials will improve Outcome: Not Progressing Goal: Emotional status will improve Outcome: Not Progressing Goal: Mental status will improve Outcome: Not Progressing   

## 2022-10-30 NOTE — [facility] Group Notes (Signed)
[facility] Group Notes:  (Nursing/MHT/Case Management/Adjunct)  Date:  10/30/2022  Time:  2000  Type of Therapy:   Narcotics Anonymous Meeting  Participation Level:  Active  Participation Quality:  Appropriate and Attentive  Affect:  Depressed and Flat  Cognitive:  Lacking  Insight:  Lacking  Engagement in Group:  Developing/Improving  Modes of Intervention:  Education and Support  Summary of Progress/Problems:  Antonio [name] 10/30/2022, 9:52 PM

## 2022-10-30 NOTE — Plan of Care (Signed)
  Problem: Education: Goal: Emotional status will improve Outcome: Not Progressing Goal: Mental status will improve Outcome: Not Progressing   Problem: Activity: Goal: Interest or engagement in activities will improve Outcome: Not Progressing   

## 2022-10-30 NOTE — Progress Notes (Signed)
   10/30/22 2251  Psych Admission Type (Psych Patients Only)  Admission Status Voluntary  Psychosocial Assessment  Patient Complaints Depression  Eye Contact Fair  Facial Expression Sad  Affect Flat  Speech Logical/coherent  Interaction Minimal  Motor Activity Unsteady  Appearance/Hygiene Body odor;Poor hygiene  Behavior Characteristics Cooperative;Anxious;Guarded  Mood Depressed  Thought Process  Coherency WDL  Content WDL  Delusions None reported or observed  Perception WDL  Hallucination None reported or observed  Judgment Impaired  Confusion None  Danger to Self  Current suicidal ideation? Denies  Agreement Not to Harm Self Yes  Description of Agreement verbal  Danger to Others  Danger to Others None reported or observed

## 2022-10-30 NOTE — Progress Notes (Signed)
   10/30/22 1116  Psych Admission Type (Psych Patients Only)  Admission Status Voluntary  Psychosocial Assessment  Patient Complaints Depression  Eye Contact Fair  Facial Expression Sad  Affect Flat  Speech Logical/coherent  Interaction Minimal  Motor Activity Unsteady  Appearance/Hygiene Body odor;Poor hygiene  Behavior Characteristics Anxious;Guarded  Mood Depressed  Thought Process  Coherency WDL  Content WDL  Delusions None reported or observed  Perception WDL  Hallucination None reported or observed  Judgment Impaired  Confusion None  Danger to Self  Current suicidal ideation? Denies  Agreement Not to Harm Self Yes  Description of Agreement Verbal  Danger to Others  Danger to Others None reported or observed

## 2022-10-30 NOTE — Group Note (Signed)
Recreation Therapy Group Note   Group Topic:Other  Group Date: 10/30/2022 Start Time: 1305 End Time: 1448 Facilitators: Seline Enzor-McCall, LRT,CTRS Location: [address] Dayroom   Activity Description/Intervention: Therapeutic Drumming. Patients with peers and staff were given the opportunity to engage in a leader facilitated [facility] Group Empowerment Drumming Circle with staff from the [employer], in partnership with [employer]. Teaching laboratory technician and trained [employer], Theodoro Doing leading with LRT observing and documenting intervention and pt response. This evidenced-based practice targets 7 areas of health and wellbeing in the human experience including: stress-reduction, exercise, self-expression, camaraderie/support, nurturing, spirituality, and music-making (leisure).   Goal Area(s) Addresses:  Patient will engage in pro-social way in music group.  Patient will follow directions of drum leader on the first prompt. Patient will demonstrate no behavioral issues during group.  Patient will identify if a reduction in stress level occurs as a result of participation in therapeutic drum circle.    Education: Leisure exposure, Pharmacologist, Musical expression, Discharge Planning   Affect/Mood: N/A   Participation Level: Did not attend    Clinical Observations/Individualized Feedback:     Plan: Continue to engage patient in RT group sessions 2-3x/week.   Zeidy Tayag-McCall, LRT,CTRS 10/30/2022 4:01 PM

## 2022-10-30 NOTE — [facility] Group Notes (Signed)
Spiritual care group facilitated by Chaplain Katy Claussen, B[facility]  Group focused on topic of strength. Group members reflected on what thoughts and feelings emerge when they hear this topic. They then engaged in facilitated dialog around how strength is present in their lives. This dialog focused on representing what strength had been to them in their lives (images and patterns given) and what they saw as helpful in their life now (what they needed / wanted).  Activity drew on narrative framework.  Patient Progress: Did not attend.  

## 2022-10-30 NOTE — Progress Notes (Signed)
Palliative care consult received on 7/23. I reviewed the chart in detail and discussed this patient's case with Dr. Jodie Echevaria and also with CSW Joy Buford. This patient is chronically ill with poorly managed diabetes, non-healing leg wound, chronic pancreatitis, Hep C Cirrhosis -untreated, recurrent multi-focal PNA from dysphagia of unknown etiology. He has no medial insurance and is homeless. He is covered under [employer] for [employer] and Support Only. He has severe major depression and history of methamphetamine, alcohol use disorder.  I discussed with BH providers that without a location to provide services either hospice or palliative that we are limited in how we can help and impact his care. In reviewing his chart I also have concerns about his medical stability and high possibility for progressive liver disease, possible occult malignancy and an actively infected lower leg wound.  Needs wound imaging and further evaluation, maybe even biopsy Concern for Head and Neck Occult Malignancy or Lung-unexplained dysphagia, weight loss, high ammonia, difficult to treat BG levels, pleural effusion  Providing both acute and chronic disease management has been nearly impossible because he is both uninsured and homeless, often has decompensated depression and inconsistent medication adherence. All of the condition he has are manageable and treatable-but left unmanaged and without a stable housing situation or care facility he will likely die from cirrhosis or sepsis from his wound.  He needs ACP so we know who how to proceed and who to contact when he comes back to ED septic or near death if he continues to be discharged back to the street or signs out AMA.  Will follow for transition plan and ACP discussion.  Anderson Malta, DO Palliative Medicine

## 2022-10-30 NOTE — Inpatient Diabetes Management (Signed)
Inpatient Diabetes Program Recommendations  AACE/ADA: New Consensus Statement on Inpatient Glycemic Control (2015)  Target Ranges:  Prepandial:   less than 140 mg/dL      Peak postprandial:   less than 180 mg/dL (1-2 hours)      Critically ill patients:  140 - 180 mg/dL    Latest Reference Range & Units 10/29/22 06:27 10/29/22 11:59 10/29/22 20:12  Glucose-Capillary 70 - 99 mg/dL 454 (H)  16 units Novolog  15 units Semglee @0900   230 (H)  10 units Novolog  270 (H)  3 units Novolog   (H): Data is abnormally high  Latest Reference Range & Units 10/30/22 06:14  Glucose-Capillary 70 - 99 mg/dL 098 (H)  16 units Novolog   (H): Data is abnormally high   Home DM Meds: Humalog 2-8 units QID        Lantus 12 units BID        Metformin 750 mg BID  Current Orders: Semglee 15 units Daily      Novolog 5 units TID with meals      Metformin 750 mg BID      Novolog Moderate Correction Scale/ SSI (0-15 units) TID AC + HS     MD- If pt will be here another day, please consider the following insulin adjustments:  1. Increase Semglee to 20 units Daily (if 15 unit dose already given please also give pt an additional 5 units Semglee X 1 dose today as well)  2. Increase Novolog Meal Coverage to 8 units TID with meals     --Will follow patient during hospitalization--  Ambrose Finland RN, MSN, CDCES Diabetes Coordinator Inpatient Glycemic Control Team Team Pager: [phone] (8a-5p)

## 2022-10-31 ENCOUNTER — Encounter (HOSPITAL_COMMUNITY): Payer: Self-pay

## 2022-10-31 ENCOUNTER — Other Ambulatory Visit: Payer: Self-pay

## 2022-10-31 ENCOUNTER — Observation Stay (HOSPITAL_COMMUNITY)
Admission: EM | Admit: 2022-10-31 | Discharge: 2022-11-02 | Disposition: A | Discharge status: Home / Self Care | Payer: MEDICAID | Attending: Internal Medicine | Admitting: Internal Medicine

## 2022-10-31 DIAGNOSIS — R197 Diarrhea, unspecified: Secondary | ICD-10-CM | POA: Diagnosis not present

## 2022-10-31 DIAGNOSIS — I5022 Chronic systolic (congestive) heart failure: Secondary | ICD-10-CM | POA: Diagnosis present

## 2022-10-31 DIAGNOSIS — F1020 Alcohol dependence, uncomplicated: Secondary | ICD-10-CM | POA: Diagnosis present

## 2022-10-31 DIAGNOSIS — D61818 Other pancytopenia: Secondary | ICD-10-CM | POA: Diagnosis present

## 2022-10-31 DIAGNOSIS — E1165 Type 2 diabetes mellitus with hyperglycemia: Principal | ICD-10-CM | POA: Diagnosis present

## 2022-10-31 DIAGNOSIS — S91002A Unspecified open wound, left ankle, initial encounter: Secondary | ICD-10-CM | POA: Diagnosis present

## 2022-10-31 DIAGNOSIS — Z86718 Personal history of other venous thrombosis and embolism: Secondary | ICD-10-CM | POA: Insufficient documentation

## 2022-10-31 DIAGNOSIS — L97329 Non-pressure chronic ulcer of left ankle with unspecified severity: Secondary | ICD-10-CM | POA: Insufficient documentation

## 2022-10-31 DIAGNOSIS — K7460 Unspecified cirrhosis of liver: Secondary | ICD-10-CM | POA: Diagnosis present

## 2022-10-31 DIAGNOSIS — Z7984 Long term (current) use of oral hypoglycemic drugs: Secondary | ICD-10-CM | POA: Insufficient documentation

## 2022-10-31 DIAGNOSIS — F332 Major depressive disorder, recurrent severe without psychotic features: Secondary | ICD-10-CM | POA: Diagnosis present

## 2022-10-31 DIAGNOSIS — Z765 Malingerer [conscious simulation]: Secondary | ICD-10-CM | POA: Insufficient documentation

## 2022-10-31 DIAGNOSIS — F17210 Nicotine dependence, cigarettes, uncomplicated: Secondary | ICD-10-CM | POA: Insufficient documentation

## 2022-10-31 LAB — CBC WITH DIFFERENTIAL/PLATELET
Abs Immature Granulocytes: 0.03 10*3/uL (ref 0.00–0.07)
Abs Immature Granulocytes: 0.05 10*3/uL (ref 0.00–0.07)
Basophils Absolute: 0 10*3/uL (ref 0.0–0.1)
Basophils Absolute: 0 10*3/uL (ref 0.0–0.1)
Basophils Relative: 1 %
Basophils Relative: 1 %
Eosinophils Absolute: 0.1 10*3/uL (ref 0.0–0.5)
Eosinophils Absolute: 0.1 10*3/uL (ref 0.0–0.5)
Eosinophils Relative: 2 %
Eosinophils Relative: 2 %
HCT: 34.4 % — ABNORMAL LOW (ref 39.0–52.0)
HCT: 32.8 % — ABNORMAL LOW (ref 39.0–52.0)
Hemoglobin: 11.5 g/dL — ABNORMAL LOW (ref 13.0–17.0)
Hemoglobin: 11.1 g/dL — ABNORMAL LOW (ref 13.0–17.0)
Immature Granulocytes: 1 %
Immature Granulocytes: 1 %
Lymphocytes Relative: 37 %
Lymphocytes Relative: 29 %
Lymphs Abs: 1.3 10*3/uL (ref 0.7–4.0)
Lymphs Abs: 1.1 10*3/uL (ref 0.7–4.0)
MCH: 31.3 pg (ref 26.0–34.0)
MCH: 31.3 pg (ref 26.0–34.0)
MCHC: 33.4 g/dL (ref 30.0–36.0)
MCHC: 33.8 g/dL (ref 30.0–36.0)
MCV: 93.7 fL (ref 80.0–100.0)
MCV: 92.4 fL (ref 80.0–100.0)
Monocytes Absolute: 0.2 10*3/uL (ref 0.1–1.0)
Monocytes Absolute: 0.3 10*3/uL (ref 0.1–1.0)
Monocytes Relative: 6 %
Monocytes Relative: 7 %
Neutro Abs: 1.9 10*3/uL (ref 1.7–7.7)
Neutro Abs: 2.3 10*3/uL (ref 1.7–7.7)
Neutrophils Relative %: 53 %
Neutrophils Relative %: 60 %
Platelets: 104 10*3/uL — ABNORMAL LOW (ref 150–400)
Platelets: 106 10*3/uL — ABNORMAL LOW (ref 150–400)
RBC: 3.67 MIL/uL — ABNORMAL LOW (ref 4.22–5.81)
RBC: 3.55 MIL/uL — ABNORMAL LOW (ref 4.22–5.81)
RDW: 14.5 % (ref 11.5–15.5)
RDW: 14 % (ref 11.5–15.5)
WBC: 3.6 10*3/uL — ABNORMAL LOW (ref 4.0–10.5)
WBC: 3.9 10*3/uL — ABNORMAL LOW (ref 4.0–10.5)
nRBC: 0 % (ref 0.0–0.2)
nRBC: 0 % (ref 0.0–0.2)

## 2022-10-31 LAB — COMPREHENSIVE METABOLIC PANEL
ALT: 36 U/L (ref 0–44)
ALT: 34 U/L (ref 0–44)
AST: 37 U/L (ref 15–41)
AST: 35 U/L (ref 15–41)
Albumin: 3.6 g/dL (ref 3.5–5.0)
Albumin: 3.4 g/dL — ABNORMAL LOW (ref 3.5–5.0)
Alkaline Phosphatase: 108 U/L (ref 38–126)
Alkaline Phosphatase: 103 U/L (ref 38–126)
Anion gap: 10 (ref 5–15)
Anion gap: 10 (ref 5–15)
BUN: 24 mg/dL — ABNORMAL HIGH (ref 6–20)
BUN: 20 mg/dL (ref 6–20)
CO2: 25 mmol/L (ref 22–32)
CO2: 23 mmol/L (ref 22–32)
Calcium: 8.7 mg/dL — ABNORMAL LOW (ref 8.9–10.3)
Calcium: 8.6 mg/dL — ABNORMAL LOW (ref 8.9–10.3)
Chloride: 98 mmol/L (ref 98–111)
Chloride: 99 mmol/L (ref 98–111)
Creatinine, Ser: 0.77 mg/dL (ref 0.61–1.24)
Creatinine, Ser: 0.65 mg/dL (ref 0.61–1.24)
GFR, Estimated: >60 mL/min (ref >60)
GFR, Estimated: >60 mL/min (ref >60)
Glucose, Bld: 337 mg/dL — ABNORMAL HIGH (ref 70–99)
Glucose, Bld: 272 mg/dL — ABNORMAL HIGH (ref 70–99)
Potassium: 4.3 mmol/L (ref 3.5–5.1)
Potassium: 4.3 mmol/L (ref 3.5–5.1)
Sodium: 133 mmol/L — ABNORMAL LOW (ref 135–145)
Sodium: 132 mmol/L — ABNORMAL LOW (ref 135–145)
Total Bilirubin: 0.6 mg/dL (ref 0.3–1.2)
Total Bilirubin: 0.4 mg/dL (ref 0.3–1.2)
Total Protein: 7 g/dL (ref 6.5–8.1)
Total Protein: 6.7 g/dL (ref 6.5–8.1)

## 2022-10-31 LAB — URINALYSIS, ROUTINE W REFLEX MICROSCOPIC
Bilirubin Urine: NEGATIVE
Glucose, UA: >=500 mg/dL — AB
Hgb urine dipstick: NEGATIVE
Ketones, ur: NEGATIVE mg/dL
Leukocytes,Ua: NEGATIVE
Nitrite: NEGATIVE
Protein, ur: NEGATIVE mg/dL
Specific Gravity, Urine: 1.017 (ref 1.005–1.030)
pH: 6 (ref 5.0–8.0)

## 2022-10-31 LAB — VITAMIN D 25 HYDROXY (VIT D DEFICIENCY, FRACTURES): Vit D, 25-Hydroxy: 32.01 ng/mL (ref 30–100)

## 2022-10-31 LAB — CBG MONITORING, ED: Glucose-Capillary: 255 mg/dL — ABNORMAL HIGH (ref 70–99)

## 2022-10-31 LAB — ETHANOL: Alcohol, Ethyl (B): <10 mg/dL (ref <10)

## 2022-10-31 LAB — GLUCOSE, CAPILLARY: Glucose-Capillary: 287 mg/dL — ABNORMAL HIGH (ref 70–99)

## 2022-10-31 LAB — LIPASE, BLOOD: Lipase: 30 U/L (ref 11–51)

## 2022-10-31 LAB — C-REACTIVE PROTEIN: CRP: 0.8 mg/dL (ref <1.0)

## 2022-10-31 LAB — SEDIMENTATION RATE: Sed Rate: 11 mm/hr (ref 0–16)

## 2022-10-31 LAB — T4, FREE: Free T4: 0.85 ng/dL (ref 0.61–1.12)

## 2022-10-31 MED ORDER — ONDANSETRON HCL 4 MG/2ML IJ SOLN: 4.0000 mg | Freq: Four times a day (QID) | INTRAMUSCULAR | Status: DC | PRN

## 2022-10-31 MED ORDER — ACETAMINOPHEN 650 MG RE SUPP: 650.0000 mg | Freq: Four times a day (QID) | RECTAL | Status: DC | PRN

## 2022-10-31 MED ORDER — ONDANSETRON HCL 4 MG PO TABS: 4.0000 mg | ORAL_TABLET | Freq: Four times a day (QID) | ORAL | Status: DC | PRN

## 2022-10-31 MED ORDER — LOPERAMIDE HCL 2 MG PO CAPS
2.0000 mg | ORAL_CAPSULE | Freq: Four times a day (QID) | ORAL | Status: DC | PRN
  Administered 2022-10-31: 2 mg via ORAL
  Filled 2022-10-31: qty 1

## 2022-10-31 MED ORDER — ENSURE ENLIVE PO LIQD
237.0000 mL | Freq: Two times a day (BID) | ORAL | Status: DC
  Administered 2022-10-31: 237 mL via ORAL

## 2022-10-31 MED ORDER — ACETAMINOPHEN 325 MG PO TABS
650.0000 mg | ORAL_TABLET | Freq: Four times a day (QID) | ORAL | Status: DC | PRN
  Filled 2022-10-31: qty 2

## 2022-10-31 MED ORDER — ARIPIPRAZOLE 5 MG PO TABS
10.0000 mg | ORAL_TABLET | Freq: Every day | ORAL | Status: DC
  Administered 2022-11-01 – 2022-11-02 (×2): 10 mg via ORAL
  Filled 2022-10-31 (×2): qty 2

## 2022-10-31 MED ORDER — INSULIN GLARGINE-YFGN 100 UNIT/ML ~~LOC~~ SOLN
12.0000 [IU] | Freq: Two times a day (BID) | SUBCUTANEOUS | Status: DC
  Administered 2022-10-31 – 2022-11-01 (×2): 12 [IU] via SUBCUTANEOUS
  Filled 2022-10-31 (×3): qty 0.12

## 2022-10-31 MED ORDER — ONDANSETRON HCL 4 MG/2ML IJ SOLN
4.0000 mg | Freq: Once | INTRAMUSCULAR | Status: AC
  Administered 2022-10-31: 4 mg via INTRAVENOUS
  Filled 2022-10-31: qty 2

## 2022-10-31 MED ORDER — SERTRALINE HCL 100 MG PO TABS
100.0000 mg | ORAL_TABLET | Freq: Every day | ORAL | Status: DC
  Administered 2022-11-01 – 2022-11-02 (×2): 100 mg via ORAL
  Filled 2022-10-31 (×2): qty 1

## 2022-10-31 MED ORDER — GABAPENTIN 300 MG PO CAPS
600.0000 mg | ORAL_CAPSULE | Freq: Four times a day (QID) | ORAL | Status: DC
  Administered 2022-10-31 – 2022-11-02 (×8): 600 mg via ORAL
  Filled 2022-10-31 (×8): qty 2

## 2022-10-31 MED ORDER — SODIUM CHLORIDE 0.9 % IV BOLUS
1000.0000 mL | Freq: Once | INTRAVENOUS | Status: AC
  Administered 2022-10-31: 1000 mL via INTRAVENOUS

## 2022-10-31 MED ORDER — SODIUM CHLORIDE 0.9 % IV SOLN: INTRAVENOUS | Status: DC

## 2022-10-31 MED ORDER — TRAZODONE HCL 50 MG PO TABS
50.0000 mg | ORAL_TABLET | Freq: Every day | ORAL | Status: DC
  Administered 2022-10-31 – 2022-11-01 (×2): 50 mg via ORAL
  Filled 2022-10-31 (×2): qty 1

## 2022-10-31 NOTE — ED Notes (Signed)
Pt aware that we need urine sample. 

## 2022-10-31 NOTE — Progress Notes (Signed)
   10/31/22 1000  Psych Admission Type (Psych Patients Only)  Admission Status Voluntary  Psychosocial Assessment  Patient Complaints Malaise;Depression  Eye Contact Brief  Facial Expression Flat  Affect Flat  Speech Slurred;Slow;Soft  Interaction Minimal  Motor Activity Unsteady  Appearance/Hygiene Body odor;Poor hygiene  Behavior Characteristics Cooperative;Guarded  Mood Depressed  Thought Process  Coherency WDL  Content WDL  Delusions None reported or observed  Perception WDL  Hallucination None reported or observed  Judgment Poor  Confusion None  Danger to Self  Current suicidal ideation? Denies  Agreement Not to Harm Self Yes  Description of Agreement verbal  Danger to Others  Danger to Others None reported or observed

## 2022-10-31 NOTE — Discharge Summary (Signed)
Physician Discharge Summary Note Patient:  Antonio [name] is an 52 y.o., male MRN:  [id number] DOB:  Sep 02, 1970 Patient phone:  [phone] (home)  Patient address:   [address] [place] [address],  Total Time spent with patient: 1 hour  Date of Admission:  10/26/2022 Date of Discharge: 10/31/2022  Reason for Admission:  Antonio [name] is a 52 y.o., male with a past psychiatric history of major depressive disorder and social anxiety disorder, substance use history of stimulant use disorder, tobacco use disorder, and tobacco use disorder, and significant PMHx of uncontrolled diabetes  who presents to the [facility] Voluntary from  Emergency Department  for evaluation and management of suicidal ideations   Principal Problem: Major depressive disorder, recurrent severe without psychotic features [facility]) Discharge Diagnoses: Principal Problem:   Major depressive disorder, recurrent severe without psychotic features (HCC) Active Problems:   Cachexia (HCC)  Past Psychiatric History:  Previous psych diagnoses:  major depressive disorder, social anxiety disorder Prior inpatient psychiatric treatment:  about 4 times throughout various hospitals for depression Prior outpatient psychiatric treatment:  denies Current psychiatric provider: Denies   Current therapist: Denies Psychotherapy hx: Denies   History of suicide attempts:  twice in past tried to hang himself History of homicide: Denies   Psychotropic medications: Current Gabapentin 600 mg twice a day - patient reportedly taking consistently, reports good response Sertraline 100 mg daily - patient reportedly taking consistently, reports good response Trazodone 10 mg daily at bedtime ("the white pill") - patient reportedly taking consistently, reports good response Aripiprazole daily, cannot remember dose - patient reportedly taking consistently, reports good response   Past Denies other medication trials    Allergies: endorses the following medication allergies with resulting symptoms: acetaminophen   Substance Use History: Alcohol: sober for 20 years, 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    --------   Tobacco: endorses, current, smokes 1 packs per day since age 52 Cannabis (marijuana): endorses smoking, last time 3 weeks ago Cocaine: tried in past and has since quit, no current use Methamphetamines:  endorses using, last time 3 weeks ago Psilocybin (mushrooms): never tried Ecstasy (MDMA / molly): never tried Opiates (fentanyl / heroin): tried in past, no current use Benzos (Xanax, Klonopin): never tried IV drug use: endorses ("it's how I use meth") Prescribed meds abuse: denies   History of detox: attempted detox from methamphetamines and cannabis with good results History of rehab: denies  Past Medical History:  Past Medical History:  Diagnosis Date   Alcoholic hepatitis with ascites    liver bx march 2013, no cirrhosis.  Hep b/c negative   Alcoholism /alcohol abuse 05/2011   Ascites    3 to 4 paracentesis from 2/28 to 06/10/11 at [facility] totalling  19 liters.   DVT (deep venous thrombosis) (HCC) 05/2011   Hepatitis    Pulmonary embolism (HCC) 05/2011   sent home on Xarelto.    Past Surgical History:  Procedure Laterality Date   ANKLE SURGERY     ESOPHAGOGASTRODUODENOSCOPY  3.12.2013   Dr Braulio Conte in [place].  Irregular GE Jx, biopsied benign, naso/SB feeding tube inserted    LIVER BIOPSY  06/2011   transjugular   PARACENTESIS     several during admission at [facility] 2/26- 3/18    Family History:  Medical: unknown Psych: unknown Suicide: unknown Homicide: unknown Substance use family hx: unknown   Social History:  Place of birth and grew up where: born in [place] Abuse: no history of abuse Marital Status: single  Sexual orientation: straight Children: denies Employment: unemployed Housing: unhoused Finances: no reliable source of income Legal: no Diplomatic Services operational officer: never served Consulting civil engineer: denies owning any firearms Pills stockpile: none  Hospital Course:   During the patient's hospitalization, patient had extensive initial psychiatric evaluation, and follow-up psychiatric evaluations every day.  Psychiatric diagnoses provided upon initial assessment: Major depressive disorder, recurrent, severe, r/o substance induced mood disorder, social anxiety disorder, stimulant use disorder, cannabis use disorder, tobacco use disorder, and hx alcohol use  Patient's psychiatric medications were adjusted on admission:             -- changed home gabapentin from 600 mg twice a day to 400 mg three times a day for social anxiety, neuropathic pain             -- continue home sertraline 100 mg daily for depression and social anxiety             -- continue home aripiprazole 5 mg daily to augment treatment for depression             -- continue home trazodone 50 mg at bedtime for insomnia  During the hospitalization, other adjustments were made to the patient's psychiatric medication regimen:   -- continue home gabapentin 400 mg three times a day for social anxiety, neuropathic pain             -- continue home sertraline 100 mg daily for depression and social anxiety             -- continue home aripiprazole 5 mg daily to augment treatment for depression             -- continue home trazodone 50 mg at bedtime for insomnia  Patient's care was discussed during the interdisciplinary team meeting every day during the hospitalization.  The patient denies any side effects to prescribed psychiatric medication.  Gradually, patient started adjusting to milieu. The patient was evaluated each day by a clinical provider to ascertain response to treatment. Improvement was noted by the patient's report of decreasing symptoms, improved sleep and appetite, affect, medication tolerance, behavior, and participation in unit programming.  Patient was asked each day to  complete a self inventory noting mood, mental status, pain, new symptoms, anxiety and concerns.   Symptoms were reported as significantly decreased or resolved completely by discharge.  The patient reports that their mood is stable.  The patient denied having suicidal thoughts for more than 48 hours prior to discharge.  Patient denies having homicidal thoughts.  Patient denies having auditory hallucinations.  Patient denies any visual hallucinations or other symptoms of psychosis.  The patient was motivated to continue taking medication with a goal of continued improvement in mental health.   Symptoms were reported as significantly decreased or resolved completely by discharge.   On day of discharge, the patient reports that their mood is stable. The patient denied having suicidal thoughts for more than 48 hours prior to discharge.  Patient denies having homicidal thoughts.  Patient denies having auditory hallucinations.  Patient denies any visual hallucinations or other symptoms of psychosis. The patient was motivated to continue taking medication with a goal of continued improvement in mental health.   The patient reports their target psychiatric symptoms of suicidal ideations and depression responded well to the psychiatric medications, and the patient reports overall benefit other psychiatric hospitalization. Supportive psychotherapy was provided to the patient. The patient also participated in regular group therapy while hospitalized. Coping skills, problem solving  as well as relaxation therapies were also part of the unit programming.  Labs were reviewed with the patient, and abnormal results were discussed with the patient.  The patient is able to verbalize their individual safety plan to this provider.  # It is recommended to the patient to continue psychiatric medications as prescribed, after discharge from the hospital.    # It is recommended to the patient to follow up with your  outpatient psychiatric provider and PCP.  # It was discussed with the patient, the impact of alcohol, drugs, tobacco have been there overall psychiatric and medical wellbeing, and total abstinence from substance use was recommended the patient.ed.  # Prescriptions provided or sent directly to preferred pharmacy at discharge. Patient agreeable to plan. Given opportunity to ask questions. Appears to feel comfortable with discharge.    # In the event of worsening symptoms, the patient is instructed to call the crisis hotline, 911 and or go to the nearest ED for appropriate evaluation and treatment of symptoms. To follow-up with primary care provider for other medical issues, concerns and or health care needs  # Patient was discharged  [facility]  with a plan to follow up as noted below.  On day of discharge patient denies suicidal ideations or feeling depressed. He developed diarrhea overnight and this will be addressed at [facility] once he goes to the med-surg floor.  Physical Findings: AIMS:  , ,  ,  ,    CIWA:    COWS:     Musculoskeletal: Strength & Muscle Tone: within normal limits Gait & Station: normal Patient leans: N/A  Psychiatric Specialty Exam  Presentation  General Appearance: Disheveled  Eye Contact:Fair  Speech:Slow  Speech Volume:Decreased  Handedness:Right   Mood and Affect  Mood:-- ("I feel okay")  Duration of Depression Symptoms: Less than two weeks  Affect:Flat   Thought Process  Thought Processes:Coherent; Goal Directed; Linear  Descriptions of Associations:Intact  Orientation:Full (Time, Place and Person)  Thought Content:WDL  History of Schizophrenia/Schizoaffective disorder:No  Duration of Psychotic Symptoms:No data recorded Hallucinations:Hallucinations: None  Ideas of Reference:None  Suicidal Thoughts:Suicidal Thoughts: No SI Active Intent and/or Plan: Without Intent SI Passive Intent and/or Plan: Without Intent  Homicidal  Thoughts:Homicidal Thoughts: No   Sensorium  Memory:Immediate Good; Recent Good; Remote Good  Judgment:Good  Insight:Good   Executive Functions  Concentration:Good  Attention Span:Good  Recall:Good  Fund of Knowledge:Good  Language:Good   Psychomotor Activity  Psychomotor Activity:Psychomotor Activity: Normal   Assets  Assets:Resilience   Sleep  Sleep:Sleep: Good   Physical Exam: Physical Exam Vitals and nursing note reviewed.  Constitutional:      Appearance: Normal appearance.  HENT:     Head: Normocephalic and atraumatic.  Pulmonary:     Effort: Pulmonary effort is normal.  Musculoskeletal:     Comments: Ambulating on wheelchair  Neurological:     General: No focal deficit present.     Mental Status: He is alert.    Review of Systems  Constitutional: Negative.   Respiratory: Negative.    Cardiovascular: Negative.   Gastrointestinal:  Positive for diarrhea.  Genitourinary: Negative.    Blood pressure 106/77, pulse 86, temperature 97.7 F (36.5 C), temperature source Oral, resp. rate 16, height 6' (1.829 m), weight 56.6 kg, SpO2 100%. Body mass index is 16.93 kg/m.  Social History   Tobacco Use  Smoking Status Every Day   Current packs/day: 1.00   Average packs/day: 1 pack/day for 30.0 years (30.0 ttl pk-yrs)  Types: Cigarettes   Start date: 10/26/2007  Smokeless Tobacco Never   Tobacco Cessation:  Prescription not provided because: patient transferring to ED  Blood Alcohol level:  Lab Results  Component Value Date   ETH <10 10/19/2022    Metabolic Disorder Labs:  Lab Results  Component Value Date   HGBA1C 9.9 (H) 10/19/2022   MPG 237 10/19/2022   MPG 306 10/02/2021   No results found for: "PROLACTIN" Lab Results  Component Value Date   CHOL 136 10/28/2022   TRIG 113 10/28/2022   HDL 51 10/28/2022   CHOLHDL 2.7 10/28/2022   VLDL 23 10/28/2022   LDLCALC 62 10/28/2022   LDLCALC 82 09/15/2020    See Psychiatric Specialty  Exam and Suicide Risk Assessment completed by Attending Physician prior to discharge.  Discharge destination:  Other:  [facility]  Is patient on multiple antipsychotic therapies at discharge:  No   Has Patient had three or more failed trials of antipsychotic monotherapy by history:  No  Recommended Plan for Multiple Antipsychotic Therapies: NA  Discharge Instructions     Activity as tolerated - No restrictions   Complete by: As directed    Ambulatory referral to Infectious Disease   Complete by: As directed    Chronic HCV   Diet - low sodium heart healthy   Complete by: As directed    Leave dressing on - Keep it clean, dry, and intact until clinic visit   Complete by: As directed       Allergies as of 10/31/2022       Reactions   Acetaminophen Other (See Comments)   "Liver issues"        Medication List     TAKE these medications      Indication  ARIPiprazole 10 MG tablet Commonly known as: ABILIFY Take 10 mg by mouth daily.  Indication: Major Depressive Disorder   gabapentin 600 MG tablet Commonly known as: NEURONTIN Take 600 mg by mouth 4 (four) times daily.  Indication: Neuropathic Pain   HumaLOG KwikPen 100 UNIT/ML KwikPen Generic drug: insulin lispro Inject 2-8 Units into the skin See admin instructions. Inject 2-8 units into the skin four times a day, per sliding scale: BGL 140-180 = 2 units; 181-240 = 4 units; 241-300 = 6 units; 301-350 = 8 units  Indication: Type 2 Diabetes   Lantus SoloStar 100 UNIT/ML Solostar Pen Generic drug: insulin glargine Inject 12 Units into the skin 2 (two) times daily.  Indication: Type 2 Diabetes   metFORMIN 750 MG 24 hr tablet Commonly known as: GLUCOPHAGE-XR Take 1 tablet (750 mg total) by mouth 2 (two) times daily with a meal.  Indication: Type 2 Diabetes   sertraline 100 MG tablet Commonly known as: ZOLOFT Take 1 tablet (100 mg total) by mouth daily.  Indication: Major Depressive Disorder   traZODone 50  MG tablet Commonly known as: DESYREL Take 50 mg by mouth at bedtime.  Indication: Trouble Sleeping        Follow-up [employer] [employer]. Go to.   Why: Please go to this provider for group therapy and medication management services, Monday through Friday from 8:00 am to 3:00 pm. Contact information: [address] [place] [address] [phone]                 Follow-up recommendations / Comments: Activity: as tolerated  Diet: heart healthy  Other: -Follow-up with your outpatient psychiatric provider -instructions on appointment date, time,  and address (location) are provided to you in discharge paperwork.  -Take your psychiatric medications as prescribed at discharge - instructions are provided to you in the discharge paperwork  -Follow-up with outpatient primary care doctor and other specialists -for management of chronic medical disease, including: health maintenance checks and diabetes, foot wound, and diarrhea  -Testing: Follow-up with outpatient provider for abnormal lab results: poorly controlled glucose  -Recommend abstinence from alcohol, tobacco, and other illicit drug use at discharge.   -If your psychiatric symptoms recur, worsen, or if you have side effects to your psychiatric medications, call your outpatient psychiatric provider, 911, [phone] or go to the nearest emergency department.  -If suicidal thoughts recur, call your outpatient psychiatric provider, 911, [phone] or go to the nearest emergency department.  Signed: Augusto Gamble, MD 10/31/2022, 11:44 AM

## 2022-10-31 NOTE — Plan of Care (Signed)
  Problem: Education: Goal: Emotional status will improve Outcome: Progressing Goal: Mental status will improve Outcome: Progressing   Problem: Activity: Goal: Interest or engagement in activities will improve Outcome: Progressing Goal: Sleeping patterns will improve Outcome: Progressing

## 2022-10-31 NOTE — [facility] Suicide Risk Assessment (Signed)
Suicide Risk Assessment  Discharge Assessment    [facility] Discharge Suicide Risk Assessment  Principal Problem: Major depressive disorder, recurrent severe without psychotic features (HCC) Discharge Diagnoses: Principal Problem:   Major depressive disorder, recurrent severe without psychotic features (HCC) Active Problems:   Cachexia (HCC)   Reason for Admission: Antonio [name] is a 52 y.o., male with a past psychiatric history of major depressive disorder and social anxiety disorder, substance use history of stimulant use disorder, tobacco use disorder, and tobacco use disorder, and significant PMHx of uncontrolled diabetes who presents to the [facility] Voluntary from Emergency Department for evaluation and management of suicidal ideations   Hospital Summary During the patient's hospitalization, patient had extensive initial psychiatric evaluation, and follow-up psychiatric evaluations every day.   Psychiatric diagnoses provided upon initial assessment: Major depressive disorder, recurrent, severe, r/o substance induced mood disorder, social anxiety disorder, stimulant use disorder, cannabis use disorder, tobacco use disorder, and hx alcohol use   Patient's psychiatric medications were adjusted on admission:             -- changed home gabapentin from 600 mg twice a day to 400 mg three times a day for social anxiety, neuropathic pain             -- continue home sertraline 100 mg daily for depression and social anxiety             -- continue home aripiprazole 5 mg daily to augment treatment for depression             -- continue home trazodone 50 mg at bedtime for insomnia   During the hospitalization, other adjustments were made to the patient's psychiatric medication regimen:    -- continue home gabapentin 400 mg three times a day for social anxiety, neuropathic pain             -- continue home sertraline 100 mg daily for depression and social anxiety             --  continue home aripiprazole 5 mg daily to augment treatment for depression             -- continue home trazodone 50 mg at bedtime for insomnia   Patient's care was discussed during the interdisciplinary team meeting every day during the hospitalization.   The patient denies any side effects to prescribed psychiatric medication.   Gradually, patient started adjusting to milieu. The patient was evaluated each day by a clinical provider to ascertain response to treatment. Improvement was noted by the patient's report of decreasing symptoms, improved sleep and appetite, affect, medication tolerance, behavior, and participation in unit programming.  Patient was asked each day to complete a self inventory noting mood, mental status, pain, new symptoms, anxiety and concerns.   Symptoms were reported as significantly decreased or resolved completely by discharge.  The patient reports that their mood is stable.  The patient denied having suicidal thoughts for more than 48 hours prior to discharge.  Patient denies having homicidal thoughts.  Patient denies having auditory hallucinations.  Patient denies any visual hallucinations or other symptoms of psychosis.  The patient was motivated to continue taking medication with a goal of continued improvement in mental health.    Symptoms were reported as significantly decreased or resolved completely by discharge.    On day of discharge, the patient reports that their mood is stable. The patient denied having suicidal thoughts for more than 48 hours prior to discharge.  Patient denies  having homicidal thoughts.  Patient denies having auditory hallucinations.  Patient denies any visual hallucinations or other symptoms of psychosis. The patient was motivated to continue taking medication with a goal of continued improvement in mental health.    The patient reports their target psychiatric symptoms of suicidal ideations and depression responded well to the psychiatric  medications, and the patient reports overall benefit other psychiatric hospitalization. Supportive psychotherapy was provided to the patient. The patient also participated in regular group therapy while hospitalized. Coping skills, problem solving as well as relaxation therapies were also part of the unit programming.   Labs were reviewed with the patient, and abnormal results were discussed with the patient.   The patient is able to verbalize their individual safety plan to this provider.   # It is recommended to the patient to continue psychiatric medications as prescribed, after discharge from the hospital.     # It is recommended to the patient to follow up with your outpatient psychiatric provider and PCP.   # It was discussed with the patient, the impact of alcohol, drugs, tobacco have been there overall psychiatric and medical wellbeing, and total abstinence from substance use was recommended the patient.ed.   # Prescriptions provided or sent directly to preferred pharmacy at discharge. Patient agreeable to plan. Given opportunity to ask questions. Appears to feel comfortable with discharge.    # In the event of worsening symptoms, the patient is instructed to call the crisis hotline, 911 and or go to the nearest ED for appropriate evaluation and treatment of symptoms. To follow-up with primary care provider for other medical issues, concerns and or health care needs   # Patient was discharged  [name]  with a plan to follow up as noted below.   On day of discharge patient denies suicidal ideations or feeling depressed. He developed diarrhea overnight and this will be addressed at [facility] once he goes to the med-surg floor.  Total Time spent with patient: 1 hour  Musculoskeletal: Strength & Muscle Tone: within normal limits Gait & Station: normal Patient leans: N/A  Psychiatric Specialty Exam  Presentation  General Appearance: Disheveled  Eye  Contact:Fair  Speech:Slow  Speech Volume:Decreased  Handedness:Right   Mood and Affect  Mood:-- ("I feel okay")  Duration of Depression Symptoms: Less than two weeks  Affect:Flat   Thought Process  Thought Processes:Coherent; Goal Directed; Linear  Descriptions of Associations:Intact  Orientation:Full (Time, Place and Person)  Thought Content:WDL  History of Schizophrenia/Schizoaffective disorder:No  Duration of Psychotic Symptoms:No data recorded Hallucinations:Hallucinations: None  Ideas of Reference:None  Suicidal Thoughts:Suicidal Thoughts: No SI Active Intent and/or Plan: Without Intent SI Passive Intent and/or Plan: Without Intent  Homicidal Thoughts:Homicidal Thoughts: No   Sensorium  Memory:Immediate Good; Recent Good; Remote Good  Judgment:Good  Insight:Good   Executive Functions  Concentration:Good  Attention Span:Good  Recall:Good  Fund of Knowledge:Good  Language:Good   Psychomotor Activity  Psychomotor Activity:Psychomotor Activity: Normal   Assets  Assets:Resilience   Sleep  Sleep:Sleep: Good   Physical Exam: Physical Exam Vitals and nursing note reviewed.  Constitutional:      Appearance: Normal appearance.  HENT:     Head: Normocephalic and atraumatic.  Pulmonary:     Effort: Pulmonary effort is normal.  Musculoskeletal:     Comments: Ambulating on wheelchair  Neurological:     General: No focal deficit present.     Mental Status: He is alert.      Review of Systems  Constitutional:  Negative.   Respiratory: Negative.    Cardiovascular: Negative.   Gastrointestinal:  Positive for diarrhea.  Genitourinary: Negative.   Blood pressure 106/77, pulse 86, temperature 97.7 F (36.5 C), temperature source Oral, resp. rate 16, height 6' (1.829 m), weight 56.6 kg, SpO2 100%. Body mass index is 16.93 kg/m.  Mental Status Per Nursing Assessment::   On Admission:  Suicidal ideation indicated by patient  Demographic  Factors:  Male, Caucasian, Low socioeconomic status, and Unemployed  Loss Factors: Loss of significant relationship and Decline in physical health  Historical Factors: Prior suicide attempts  Risk Reduction Factors:   NA  Continued Clinical Symptoms:  Alcohol/Substance Abuse/Dependencies More than one psychiatric diagnosis  Cognitive Features That Contribute To Risk:  None    Suicide Risk:  Mild: There are no identifiable suicide plans, no associated intent, mild dysphoria and related symptoms, good self-control (both objective and subjective assessment), few other risk factors, and identifiable protective factors, including available and accessible social support.   Follow-up Information     [employer] [employer]. Go to.   Why: Please go to this provider for group therapy and medication management services, Monday through Friday from 8:00 am to 3:00 pm. Contact information: [address] [place] [address] [phone]                 Plan Of Care/Follow-up recommendations:  Activity: as tolerated   Diet: heart healthy   Other: -Follow-up with your outpatient psychiatric provider -instructions on appointment date, time, and address (location) are provided to you in discharge paperwork.   -Take your psychiatric medications as prescribed at discharge - instructions are provided to you in the discharge paperwork   -Follow-up with outpatient primary care doctor and other specialists -for management of chronic medical disease, including: health maintenance checks and diabetes, foot wound, and diarrhea   -Testing: Follow-up with outpatient provider for abnormal lab results: poorly controlled glucose   -Recommend abstinence from alcohol, tobacco, and other illicit drug use at discharge.    -If your psychiatric symptoms recur, worsen, or if you have side effects to your psychiatric medications, call your outpatient psychiatric provider, 911, [phone] or go to  the nearest emergency department.   -If suicidal thoughts recur, call your outpatient psychiatric provider, 911, [phone] or go to the nearest emergency department.  Signed: Augusto Gamble, MD 10/31/2022, 11:48 AM
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
## 2022-10-31 NOTE — Group Note (Signed)
Date:  10/31/2022 Time:  10:14 AM  Group Topic/Focus:  Goals Group:   The focus of this group is to help patients establish daily goals to achieve during treatment and discuss how the patient can incorporate goal setting into their daily lives to aide in recovery.    Participation Level:  Did Not Attend  Participation Quality:      Affect:      Cognitive:      Insight: None  Engagement in Group:      Modes of Intervention:      Additional Comments:     Reymundo Poll 10/31/2022, 10:14 AM

## 2022-10-31 NOTE — Plan of Care (Signed)
  Problem: Education: Goal: Knowledge of General Education information will improve Description: Including pain rating scale, medication(s)/side effects and non-pharmacologic comfort measures Outcome: Progressing   Problem: Health Behavior/Discharge Planning: Goal: Ability to manage health-related needs will improve Outcome: Progressing   Problem: Clinical Measurements: Goal: Ability to maintain clinical measurements within normal limits will improve Outcome: Progressing Goal: Will remain free from infection Outcome: Progressing   Problem: Nutrition: Goal: Adequate nutrition will be maintained Outcome: Progressing   Problem: Elimination: Goal: Will not experience complications related to bowel motility Outcome: Progressing   Problem: Safety: Goal: Ability to remain free from injury will improve Outcome: Progressing   Problem: Skin Integrity: Goal: Risk for impaired skin integrity will decrease Outcome: Not Progressing Note: Medial L ankle wound.

## 2022-10-31 NOTE — ED Notes (Signed)
Unsuccessful IV attempt x2.  

## 2022-10-31 NOTE — ED Triage Notes (Signed)
BIB [facility] after being d/c from [facility], Pt states he is supposed to be admitted to the hospital by Dr. Robb Matar. Pt is lethargic, c/o abdominal pain, diarrhea, non healing wound to right foot, hx of DM, Hep C. 293 cbg 106/60 BP 83 HR 98% room air

## 2022-10-31 NOTE — ED Notes (Signed)
ED TO INPATIENT HANDOFF REPORT  Name/Age/Gender Antonio [name] 52 y.o. male  Code Status    Code Status Orders  (From admission, onward)           Start     Ordered   10/31/22 1310  Full code  Continuous       Question:  By:  Answer:  Consent: discussion documented in EHR   10/31/22 1311           Code Status History     Date Active Date Inactive Code Status Order ID Comments User Context   10/26/2022 2208 10/31/2022 1153 Full Code [id number]  Earney Navy, NP Inpatient   09/27/2021 1237 10/08/2021 1224 DNR [id number]  Azucena Fallen, MD Inpatient   09/25/2021 1350 09/27/2021 1237 Partial Code [id number]  Azucena Fallen, MD Inpatient   09/22/2021 2100 09/25/2021 1350 Full Code [id number]  Briscoe Deutscher, MD ED   09/13/2020 2119 09/17/2020 1721 Full Code [id number]  Jackelyn Poling, NP Inpatient   06/22/2011 0142 07/01/2011 2001 Full Code [id number]  Sandria Senter, RN Inpatient       Home/SNF/Other Home  Chief Complaint Diarrhea [R19.7]  Level of Care/Admitting Diagnosis ED Disposition     ED Disposition  Admit   Condition  --   Comment  Hospital Area: [facility] [[id number]]  Level of Care: Med-Surg [16]  May place patient in observation at [facility] or [name] if equivalent level of care is available:: No  Covid Evaluation: Asymptomatic - no recent exposure (last 10 days) testing not required  Diagnosis: Diarrhea [787.91.ICD-9-CM]  Admitting Physician: Bobette Mo [[id number]]  Attending Physician: Bobette Mo [[id number]]          Medical History Past Medical History:  Diagnosis Date   Alcoholic hepatitis with ascites    liver bx march 2013, no cirrhosis.  Hep b/c negative   Alcoholism /alcohol abuse 05/2011   Ascites    3 to 4 paracentesis from 2/28 to 06/10/11 at [facility] totalling  19 liters.   DVT (deep venous thrombosis) (HCC) 05/2011   Hepatitis    Pulmonary embolism (HCC) 05/2011   sent home  on Xarelto.    Allergies Allergies  Allergen Reactions   Acetaminophen Other (See Comments)    "Liver issues"    IV Location/Drains/Wounds Patient Lines/Drains/Airways Status     Active Line/Drains/Airways     None            Labs/Imaging Results for orders placed or performed during the hospital encounter of 10/31/22 (from the past 48 hour(s))  Comprehensive metabolic panel     Status: Abnormal   Collection Time: 10/31/22 12:35 PM  Result Value Ref Range   Sodium 132 (L) 135 - 145 mmol/L   Potassium 4.3 3.5 - 5.1 mmol/L   Chloride 99 98 - 111 mmol/L   CO2 23 22 - 32 mmol/L   Glucose, Bld 272 (H) 70 - 99 mg/dL    Comment: Glucose reference range applies only to samples taken after fasting for at least 8 hours.   BUN 20 6 - 20 mg/dL   Creatinine, Ser 1.66 0.61 - 1.24 mg/dL   Calcium 8.6 (L) 8.9 - 10.3 mg/dL   Total Protein 6.7 6.5 - 8.1 g/dL   Albumin 3.4 (L) 3.5 - 5.0 g/dL   AST 35 15 - 41 U/L   ALT 34 0 - 44 U/L   Alkaline Phosphatase 103 38 - 126 U/L  Total Bilirubin 0.4 0.3 - 1.2 mg/dL   GFR, Estimated >46 >96 mL/min    Comment: (NOTE) Calculated using the CKD-EPI Creatinine Equation (2021)    Anion gap 10 5 - 15    Comment: Performed at [facility], [address], [place], [place] [address]  Lipase, blood     Status: None   Collection Time: 10/31/22 12:35 PM  Result Value Ref Range   Lipase 30 11 - 51 U/L    Comment: Performed at [facility], [address] [place], [place] [address]  CBG monitoring, ED     Status: Abnormal   Collection Time: 10/31/22  1:00 PM  Result Value Ref Range   Glucose-Capillary 255 (H) 70 - 99 mg/dL    Comment: Glucose reference range applies only to samples taken after fasting for at least 8 hours.   No results found.  Pending Labs Unresulted Labs (From admission, onward)     Start     Ordered   11/01/22 0500  Comprehensive metabolic panel  Tomorrow morning,   R        10/31/22  1311   11/01/22 0500  CBC  Tomorrow morning,   R        10/31/22 1311   10/31/22 1311  Gastrointestinal Panel by PCR , Stool  (Gastrointestinal Panel by PCR, Stool                                                                                                                                                     **Does Not include CLOSTRIDIUM DIFFICILE testing. **If CDIFF testing is needed, place order from the "C Difficile Testing" order set.**)  Once,   R        10/31/22 1311   10/31/22 1311  C Difficile Quick Screen w PCR reflex  (C Difficile quick screen w PCR reflex panel )  Once, for 24 hours,   TIMED       References:    CDiff Information Tool   10/31/22 1311   10/31/22 1206  CBC with Differential  (ED Abdominal Pain)  Once,   STAT        10/31/22 1205   10/31/22 1206  Urinalysis, Routine w reflex microscopic -Urine, Clean Catch  (ED Abdominal Pain)  Once,   URGENT       Question:  Specimen Source  Answer:  Urine, Clean Catch   10/31/22 1205   10/31/22 1206  Ethanol  Once,   URGENT        10/31/22 1205            Vitals/Pain Today's Vitals   10/31/22 1200 10/31/22 1221  BP: 106/80   Pulse: 90   Resp: 17   Temp:  98 F (36.7 C)  TempSrc:  Oral  SpO2: 97%  Isolation Precautions Enteric precautions (UV disinfection)  Medications Medications  sodium chloride 0.9 % bolus 1,000 mL (has no administration in time range)  ondansetron (ZOFRAN) injection 4 mg (has no administration in time range)  0.9 %  sodium chloride infusion (has no administration in time range)  acetaminophen (TYLENOL) tablet 650 mg (has no administration in time range)    Or  acetaminophen (TYLENOL) suppository 650 mg (has no administration in time range)  ondansetron (ZOFRAN) tablet 4 mg (has no administration in time range)    Or  ondansetron (ZOFRAN) injection 4 mg (has no administration in time range)  gabapentin (NEURONTIN) tablet 600 mg (has no administration in time range)  insulin  glargine-yfgn (SEMGLEE) injection 12 Units (has no administration in time range)  sertraline (ZOLOFT) tablet 100 mg (has no administration in time range)  traZODone (DESYREL) tablet 50 mg (has no administration in time range)  ARIPiprazole (ABILIFY) tablet 10 mg (has no administration in time range)    Mobility walks with cane

## 2022-10-31 NOTE — H&P (Addendum)
History and Physical    Patient: Antonio [name] ZOX:[id number] DOB: [birth year] DOA: 10/31/2022 DOS: the patient was seen and examined on 10/31/2022 PCP: Pcp, No  Patient coming from: Home  Chief Complaint:  Chief Complaint  Patient presents with   Abdominal Pain   Wound Check   HPI: Antonio [name] is a 52 y.o. male with medical history significant of alcohol abuse, alcoholic hepatitis, ascites, history of DVT, pulmonary embolism, medication noncompliance, polysubstance tobacco use disorder, controlled type 2 diabetes, recurrent MDD with psychotic features was discharged from behavioral health to the emergency department due to uncontrolled blood glucose and left foot/ankle area wound. He denied fever, chills, rhinorrhea, sore throat, wheezing or hemoptysis.  No chest pain, palpitations, diaphoresis, PND, orthopnea or pitting edema of the lower extremities.  He has had multiple episodes of diarrhea since this morning.  He has had diarrhea with metformin in the past.  No abdominal pain, nausea, emesis, constipation, melena or hematochezia.  No flank pain, dysuria, frequency or hematuria.  No polyuria, polydipsia, polyphagia or blurred vision.   Lab work: His urinalysis showed glucosuria greater than 500 and rare bacteria.  CBC with a white count of 3.9, hemoglobin 11.1 g/dL platelets 811.  CMP showed a glucose of 272 mg/dL and albumin of 3.4 g/dL.  The rest of the CMP measurements were normal after sodium and calcium were corrected.  Lipase was 30 units/L.  Imaging: Left foot x-ray done 12 days ago with no acute findings.  Portable 1 view chest radiograph with no active disease.   ED course: Initial vital signs were temperature 98 F, pulse 90, respirations 17, BP 106 over 80 mmHg O2 sat 97% on room air.  The patient received ondansetron 4 mg IVP and 1000 mL normal saline bolus.  Review of Systems: As mentioned in the history of present illness. All other systems reviewed and are negative.  Past  Medical History:  Diagnosis Date   Alcoholic hepatitis with ascites    liver bx march 2013, no cirrhosis.  Hep b/c negative   Alcoholism /alcohol abuse 05/2011   Ascites    3 to 4 paracentesis from 2/28 to 06/10/11 at [facility] totalling  19 liters.   DVT (deep venous thrombosis) (HCC) 05/2011   Hepatitis    Pulmonary embolism (HCC) 05/2011   sent home on Xarelto.   Past Surgical History:  Procedure Laterality Date   ANKLE SURGERY     ESOPHAGOGASTRODUODENOSCOPY  3.12.2013   Dr Braulio Conte in [place].  Irregular GE Jx, biopsied benign, naso/SB feeding tube inserted    LIVER BIOPSY  06/2011   transjugular   PARACENTESIS     several during admission at [facility] 2/26- 3/18   Social History:  reports that he has been smoking cigarettes. He started smoking about 15 years ago. He has a 30 pack-year smoking history. He has never used smokeless tobacco. He reports current drug use. Drug: Amphetamines. He reports that he does not drink alcohol.  Allergies  Allergen Reactions   Acetaminophen Other (See Comments)    "Liver issues"    History reviewed. No pertinent family history.  Prior to Admission medications   Medication Sig Start Date End Date Taking? Authorizing Provider  ARIPiprazole (ABILIFY) 10 MG tablet Take 10 mg by mouth daily.    [provider]  gabapentin (NEURONTIN) 600 MG tablet Take 600 mg by mouth 4 (four) times daily.    [provider]  HUMALOG KWIKPEN 100 UNIT/ML KwikPen Inject 2-8 Units into the skin  See admin instructions. Inject 2-8 units into the skin four times a day, per sliding scale: BGL 140-180 = 2 units; 181-240 = 4 units; 241-300 = 6 units; 301-350 = 8 units    [provider]  LANTUS SOLOSTAR 100 UNIT/ML Solostar Pen Inject 12 Units into the skin 2 (two) times daily.    [provider]  metFORMIN (GLUCOPHAGE-XR) 750 MG 24 hr tablet Take 1 tablet (750 mg total) by mouth 2 (two) times daily with a meal. 10/19/22    Harriet Pho, Barnetta Chapel, PA-C  sertraline (ZOLOFT) 100 MG tablet Take 1 tablet (100 mg total) by mouth daily. 09/18/20   [name], NP  traZODone (DESYREL) 50 MG tablet Take 50 mg by mouth at bedtime.    [provider]    Physical Exam: Vitals:   10/31/22 1200 10/31/22 1221  BP: 106/80   Pulse: 90   Resp: 17   Temp:  98 F (36.7 C)  TempSrc:  Oral  SpO2: 97%    Physical Exam Vitals and nursing note reviewed.  Constitutional:      General: He is awake. He is not in acute distress.    Appearance: He is well-developed.  HENT:     Head: Normocephalic.     Nose: No rhinorrhea.  Eyes:     General: No scleral icterus.    Pupils: Pupils are equal, round, and reactive to light.  Neck:     Vascular: No JVD.  Cardiovascular:     Rate and Rhythm: Normal rate and regular rhythm.     Heart sounds: S1 normal and S2 normal.  Pulmonary:     Effort: Pulmonary effort is normal.     Breath sounds: Normal breath sounds. No wheezing, rhonchi or rales.  Abdominal:     Palpations: Abdomen is soft.     Tenderness: There is no abdominal tenderness. There is no right CVA tenderness, left CVA tenderness, guarding or rebound.  Musculoskeletal:     Cervical back: Neck supple.  Skin:    General: Skin is warm and dry.     Comments: Left ankle healing ulcer at the level of the internal malleolar area of the right fibula.  Please see picture below.  Neurological:     General: No focal deficit present.     Mental Status: He is alert and oriented to person, place, and time.  Psychiatric:        Behavior: Behavior is cooperative.     Data Reviewed:  Results are pending, will review when available.  Assessment and Plan: Principal Problem:   Diarrhea Observation/MedSurg. Continue IV fluids. Likely due to metformin use -But will check GI pathogen by PCR. -Check C. difficile toxin. Antiemetics as needed. Pantoprazole 40 mg IVP daily. Follow CBC, CMP in AM.  Active Problems:    Uncontrolled type 2 diabetes mellitus with hyperglycemia,  without long-term current use of insulin (HCC) Carbohydrate modified diet. CBG monitoring with RI SS. Continue Semglee 12 units p.o. twice daily.    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 Follow-up CBC in AM. Avoid alcohol consumption.    Alcoholism (HCC) Currently in remission after behavioral health admission. Hopefully the patient will remain in remission once he is discharged.    Major depressive disorder, recurrent severe without psychotic features (HCC) Continue aripiprazole 10 mg p.o. daily. Continue sertraline 100 mg p.o. daily. Continue trazodone 50 mg p.o. nightly.    Chronic systolic heart  failure [facility]) This diagnosis was when the patient was at [facility]. Does not seem overloaded. No echocardiogram available in epic. Will order transthoracic echocardiogram.    Ankle wound, left, initial encounter Discussed with general surgery. They believe this is healing properly. No need for biopsy at this moment. WOC has been consulted. Check ABIs.    Advance Care Planning:   Code Status: Full Code   Consults:   Family Communication:   Severity of Illness: The appropriate patient status for this patient is OBSERVATION. Observation status is judged to be reasonable and necessary in order to provide the required intensity of service to ensure the patient's safety. The patient's presenting symptoms, physical exam findings, and initial radiographic and laboratory data in the context of their medical condition is felt to place them at decreased risk for further clinical deterioration. Furthermore, it is anticipated that the patient will be medically stable for discharge from the hospital within 2 midnights of admission.   Author: Bobette Mo, MD 10/31/2022 1:00 PM  For on call review [email or web address].   This document was prepared using Dragon voice recognition  software and may contain some unintended transcription errors.

## 2022-10-31 NOTE — Inpatient Diabetes Management (Signed)
Inpatient Diabetes Program Recommendations  AACE/ADA: New Consensus Statement on Inpatient Glycemic Control (2015)  Target Ranges:  Prepandial:   less than 140 mg/dL      Peak postprandial:   less than 180 mg/dL (1-2 hours)      Critically ill patients:  140 - 180 mg/dL    Latest Reference Range & Units 10/30/22 06:14 10/30/22 14:07 10/30/22 16:49 10/30/22 22:04  Glucose-Capillary 70 - 99 mg/dL 295 (H)  16 units Novolog  15 units Semglee @0941  259 (H)  13 units Novolog  94  5 units Novolog  218 (H)  2 units Novolog   (H): Data is abnormally high  Latest Reference Range & Units 10/31/22 06:12  Glucose-Capillary 70 - 99 mg/dL 284 (H)  13 units Novolog  15 units Semglee  (H): Data is abnormally high   Home DM Meds: Humalog 2-8 units QID                              Lantus 12 units BID                              Metformin 750 mg BID   Current Orders: Semglee 15 units Daily                            Novolog 5 units TID with meals                            Metformin 750 mg BID                            Novolog Moderate Correction Scale/ SSI (0-15 units) TID AC + HS         MD- If pt will be here another day, please consider the following insulin adjustments:   1. Increase Semglee to 20 units Daily (Since 15 unit dose already given please also give pt an additional 5 units Semglee X 1 dose today as well)   2. Increase Novolog Meal Coverage to 8 units TID with meals   --Will follow patient during hospitalization--  Ambrose Finland RN, MSN, CDCES Diabetes Coordinator Inpatient Glycemic Control Team Team Pager: [phone] (8a-5p)

## 2022-10-31 NOTE — Progress Notes (Signed)
Discharge Note:  Patient denies SI/HI/AVH at this time. Patient discharged to [facility] for diarrhea, malaise and uncontrolled DM. Patient agrees to adhere to medication management, psychiatric follow-up, and outpatient services. Patient questions and concerns addressed and answered. Patient taken to [facility] ED via EMS.

## 2022-10-31 NOTE — ED Notes (Signed)
Pt belongings were left at [facility]. Spoke with staff there, was told they will send them over via safe transport.

## 2022-11-01 ENCOUNTER — Observation Stay (HOSPITAL_BASED_OUTPATIENT_CLINIC_OR_DEPARTMENT_OTHER): Payer: MEDICAID

## 2022-11-01 ENCOUNTER — Observation Stay (HOSPITAL_COMMUNITY): Payer: MEDICAID

## 2022-11-01 DIAGNOSIS — I5022 Chronic systolic (congestive) heart failure: Secondary | ICD-10-CM

## 2022-11-01 DIAGNOSIS — R197 Diarrhea, unspecified: Secondary | ICD-10-CM | POA: Diagnosis not present

## 2022-11-01 DIAGNOSIS — S91002A Unspecified open wound, left ankle, initial encounter: Secondary | ICD-10-CM | POA: Diagnosis not present

## 2022-11-01 DIAGNOSIS — K7460 Unspecified cirrhosis of liver: Secondary | ICD-10-CM

## 2022-11-01 DIAGNOSIS — L97309 Non-pressure chronic ulcer of unspecified ankle with unspecified severity: Secondary | ICD-10-CM | POA: Diagnosis not present

## 2022-11-01 LAB — COMPREHENSIVE METABOLIC PANEL
ALT: 31 U/L (ref 0–44)
AST: 38 U/L (ref 15–41)
Albumin: 3.3 g/dL — ABNORMAL LOW (ref 3.5–5.0)
Alkaline Phosphatase: 99 U/L (ref 38–126)
Anion gap: 8 (ref 5–15)
BUN: 22 mg/dL — ABNORMAL HIGH (ref 6–20)
CO2: 25 mmol/L (ref 22–32)
Calcium: 8.3 mg/dL — ABNORMAL LOW (ref 8.9–10.3)
Chloride: 95 mmol/L — ABNORMAL LOW (ref 98–111)
Creatinine, Ser: 0.8 mg/dL (ref 0.61–1.24)
GFR, Estimated: >60 mL/min (ref >60)
Glucose, Bld: 375 mg/dL — ABNORMAL HIGH (ref 70–99)
Potassium: 4.7 mmol/L (ref 3.5–5.1)
Sodium: 128 mmol/L — ABNORMAL LOW (ref 135–145)
Total Bilirubin: 0.9 mg/dL (ref 0.3–1.2)
Total Protein: 6.6 g/dL (ref 6.5–8.1)

## 2022-11-01 LAB — GLUCOSE, CAPILLARY
Glucose-Capillary: 369 mg/dL — ABNORMAL HIGH (ref 70–99)
Glucose-Capillary: 360 mg/dL — ABNORMAL HIGH (ref 70–99)
Glucose-Capillary: 135 mg/dL — ABNORMAL HIGH (ref 70–99)
Glucose-Capillary: 98 mg/dL (ref 70–99)

## 2022-11-01 LAB — CBC
HCT: 31.7 % — ABNORMAL LOW (ref 39.0–52.0)
Hemoglobin: 10.9 g/dL — ABNORMAL LOW (ref 13.0–17.0)
MCH: 31.3 pg (ref 26.0–34.0)
MCHC: 34.4 g/dL (ref 30.0–36.0)
MCV: 91.1 fL (ref 80.0–100.0)
Platelets: 96 10*3/uL — ABNORMAL LOW (ref 150–400)
RBC: 3.48 MIL/uL — ABNORMAL LOW (ref 4.22–5.81)
RDW: 14.1 % (ref 11.5–15.5)
WBC: 5.3 10*3/uL (ref 4.0–10.5)
nRBC: 0 % (ref 0.0–0.2)

## 2022-11-01 LAB — ECHOCARDIOGRAM COMPLETE: Single Plane A2C EF: 58.3 %

## 2022-11-01 MED ORDER — INSULIN ASPART 100 UNIT/ML IJ SOLN: 0.0000 [IU] | Freq: Three times a day (TID) | INTRAMUSCULAR | Status: DC

## 2022-11-01 MED ORDER — INSULIN ASPART 100 UNIT/ML IJ SOLN
4.0000 [IU] | Freq: Three times a day (TID) | INTRAMUSCULAR | Status: DC
  Administered 2022-11-01 – 2022-11-02 (×3): 4 [IU] via SUBCUTANEOUS

## 2022-11-01 MED ORDER — INSULIN GLARGINE-YFGN 100 UNIT/ML ~~LOC~~ SOLN
15.0000 [IU] | Freq: Two times a day (BID) | SUBCUTANEOUS | Status: DC
  Administered 2022-11-01 – 2022-11-02 (×2): 15 [IU] via SUBCUTANEOUS
  Filled 2022-11-01 (×3): qty 0.15

## 2022-11-01 MED ORDER — GLUCERNA SHAKE PO LIQD
237.0000 mL | Freq: Two times a day (BID) | ORAL | Status: DC
  Administered 2022-11-01 – 2022-11-02 (×3): 237 mL via ORAL
  Filled 2022-11-01 (×4): qty 237

## 2022-11-01 MED ORDER — INSULIN ASPART 100 UNIT/ML IJ SOLN
0.0000 [IU] | Freq: Three times a day (TID) | INTRAMUSCULAR | Status: DC
  Administered 2022-11-01: 15 [IU] via SUBCUTANEOUS
  Administered 2022-11-01: 2 [IU] via SUBCUTANEOUS
  Administered 2022-11-01: 15 [IU] via SUBCUTANEOUS
  Administered 2022-11-02: 5 [IU] via SUBCUTANEOUS

## 2022-11-01 NOTE — Consult Note (Signed)
Reason for Consult: LE wound; just DC from [facility], seen by [facility] nursing this week during that admission.  Review of images and chart. Present x 7 months, unclear etiology. Has been see by [facility]s in the past for wound  Wound type: full thickness wound left malleolus Pressure Injury POA: NA Measurement: see nursing notes Wound bed:100% new image taken yesterday; improving with current POC established earlier this week  Drainage (amount, consistency, odor) not documented  Periwound: intact, no hyperkeratosis present  Dressing procedure/placement/frequency: Silver hydrofiber for exudate and chronic wound bioburdan. Change every other day.       Re consult if needed, will not follow at this time. Thanks  Marsa Matteo [employer], RN,CWOCN, CNS, CWON-AP [phone])

## 2022-11-01 NOTE — Plan of Care (Signed)

## 2022-11-01 NOTE — TOC Initial Note (Signed)
Transition of Care [facility]) - Initial/Assessment Note    Patient Details  Name: Antonio [name] MRN: [id number] Date of Birth: 10-Mar-1971  Transition of Care [facility]) CM/SW Contact:    Otelia Santee, LCSW Phone Number: 11/01/2022, 12:09 PM  Clinical Narrative:                 Met with pt at bedside to discuss need for resources. Pt shares he is currently homeless and sleeps on the streets. Pt is from [place], [place] and would like to return at discharge. Pt shares he will need a ride home at discharge as he has no one who would be able to pick him up.  Pt is agreeable to having resources for food, transportation, shelters, and substance use. Resources have been added to AVS.  Pt also has no insurance and no primary care provider. CSW added list of providers in [facility] for pt to call to schedule  an appointment as their may be out of pocket costs.      Expected Discharge Plan: Home/Self Care Barriers to Discharge: No Barriers Identified   Patient Goals and CMS Choice Patient states their goals for this hospitalization and ongoing recovery are:: To get a ride CMS Medicare.gov Compare Post Acute Care list provided to:: Patient Choice offered to / list presented to : Patient [place] ownership interest in [facility].provided to:: Patient    Expected Discharge Plan and Services In-house Referral: Clinical Social Work Discharge Planning Services: NA Post Acute Care Choice: NA Living arrangements for the past 2 months: Homeless                 DME Arranged: N/A DME Agency: NA                  Prior Living Arrangements/Services Living arrangements for the past 2 months: Homeless Lives with:: Self Patient language and need for interpreter reviewed:: Yes Do you feel safe going back to the place where you live?: Yes      Need for Family Participation in Patient Care: No (Comment) Care giver support system in place?: No (comment) Current home services: DME  [facility]) Criminal Activity/Legal Involvement Pertinent to Current Situation/Hospitalization: No - Comment as needed  Activities of Daily Living Home Assistive Devices/Equipment: Cane (specify quad or straight) ADL Screening (condition at time of admission) Patient's cognitive ability adequate to safely complete daily activities?: Yes Is the patient deaf or have difficulty hearing?: No Does the patient have difficulty seeing, even when wearing glasses/contacts?: No Does the patient have difficulty concentrating, remembering, or making decisions?: Yes Patient able to express need for assistance with ADLs?: Yes Does the patient have difficulty dressing or bathing?: No Independently performs ADLs?: Yes (appropriate for developmental age) Does the patient have difficulty walking or climbing stairs?: Yes Weakness of Legs: Both Weakness of Arms/Hands: None  Permission Sought/Granted   Permission granted to share information with : No              Emotional Assessment Appearance:: Appears older than stated age Attitude/Demeanor/Rapport: Engaged Affect (typically observed): Accepting Orientation: : Oriented to Self, Oriented to Place, Oriented to  Time, Oriented to Situation Alcohol / Substance Use: Illicit Drugs, Alcohol Use Psych Involvement: No (comment)  Admission diagnosis:  Diarrhea [R19.7] Ankle wound, left, initial encounter [S91.002A] Diarrhea, unspecified type [R19.7] Patient Active Problem List   Diagnosis Date Noted   Diarrhea 10/31/2022   Pancytopenia (HCC) 10/31/2022   Malingering 10/31/2022   Ankle wound, left, initial  encounter 10/31/2022   Major depressive disorder, recurrent severe without psychotic features (HCC) 10/26/2022   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) 10/17/2022   Alcohol abuse 04/10/2022   Influenza B 04/10/2022   Cocaine abuse (HCC) 03/23/2022   Pressure injury of skin 09/23/2021   Multifocal pneumonia 09/22/2021    Polysubstance abuse (HCC) 09/22/2021   Uncontrolled type 2 diabetes mellitus with hyperglycemia, without long-term current use of insulin (HCC) 09/22/2021   Pleural effusion, left 09/22/2021   Protein-calorie malnutrition, severe (HCC) 09/22/2021   IVDU (intravenous drug user)    Acute respiratory failure with hypoxia (HCC)    Methamphetamine abuse (HCC) 07/25/2021   Altered mental status 01/24/2021   Anxiety 01/24/2021   Bilateral low back pain 01/24/2021   Chronic systolic heart failure (HCC) 10/01/2020   Candida glabrata infection 10/01/2020   Hip pain, chronic, right 09/15/2020   MDD (major depressive disorder), recurrent episode, moderate (HCC) 09/13/2020   Homeless 02/16/2020   Acute viral hepatitis B without hepatic coma 03/13/2017   Chronic hepatitis C virus infection (HCC) 12/15/2016   Pancreatitis, acute 07/09/2011   Peritonitis (HCC) 07/09/2011   Alcoholism (HCC) 07/09/2011   Leukocytosis 07/09/2011   Anemia 06/29/2011   SBP (spontaneous bacterial peritonitis) (HCC) 06/27/2011   Failure to thrive in childhood 06/22/2011   Ascites 06/22/2011   Tachycardia 06/21/2011   Cirrhosis (HCC) 06/21/2011   Coagulopathy (HCC) 06/21/2011   Cachexia (HCC) 06/21/2011   Hyponatremia 06/21/2011   PCP:  Oneita Hurt, No Pharmacy:   [address] - [place], [place] - [address]  [place] [address] Phone: [phone] Fax: [phone]  [id number] - [place], [place] - [address] [place] [address] Phone: [phone] Fax: [phone]     Social Determinants of Health (SDOH) Social History: SDOH Screenings   Food Insecurity: Food Insecurity Present (10/31/2022)  Housing: Patient Declined (10/31/2022)  Transportation Needs: Unmet Transportation Needs (10/31/2022)  Utilities: Not At Risk (10/31/2022)  Alcohol Screen: Low Risk  (10/27/2022)  Financial Resource Strain: Low Risk  (04/12/2022)   [facility] from [facility]  Social Connections: Unknown (08/21/2021)   [facility]  Stress: No Stress Concern Present (01/25/2021)   Received from [facility], [facility]  Tobacco Use: High Risk (10/31/2022)  Health Literacy: Medium Risk (10/01/2020)   Received from [facility]   SDOH Interventions: Food Insecurity Interventions: Inpatient TOC, Other (Comment) (Resource placed on AVS) Transportation Interventions: Inpatient TOC, Other (Comment) (Resource added to AVS; Pt will need transportation home at discharge)   Readmission Risk Interventions     No data to display

## 2022-11-01 NOTE — Consult Note (Signed)
ASSESSMENT & PLAN   CHRONIC WOUND LEFT ANKLE: This patient has had a wound on his left ankle for 7 months.  He says that this is related to having a shackle here.  He has a palpable posterior tibial pulse and a toe pressure of 62 mmHg which would suggest adequate circulation for healing.  For this reason I would not recommend arteriography.  With the palpable posterior tibial pulse were not can make his circulation much better than that.  He does have evidence of venous insufficiency.  He would therefore benefit from leg elevation to help the wound contract.  Most importantly he needs to quit tobacco completely as the nicotine can cause vasospasm in the microcirculation for up to 12 hours.  Thus he needs to get off tobacco completely.  This is his best chance of wound healing.  We would be happy to see the patient in the future as an outpatient if needed.  Vascular surgery will be available as needed.  REASON FOR CONSULT:    Peripheral arterial disease with wound left ankle the consult is requested by Dr. Hanley Ben.  HPI:   Antonio [name] is a 52 y.o. male who was admitted yesterday with abdominal pain and also with a left ankle wound.  He tells me that he has had the left ankle wound for 7 months.  He says the wound is related to having an ankle bracelet.  He denies fever or chills.  His risk factors for peripheral arterial disease include type 2 diabetes and tobacco use.  He smokes 1 pack/day.  He denies any history of hypertension or hypercholesterolemia.  He has multiple medical issues including a history of alcoholic hepatitis, ascites, history of DVT and pulmonary embolus, history of polysubstance abuse, type 2 diabetes, and a history of medication noncompliance.  Past Medical History:  Diagnosis Date   Alcoholic hepatitis with ascites    liver bx march 2013, no cirrhosis.  Hep b/c negative   Alcoholism /alcohol abuse 05/2011   Ascites    3 to 4 paracentesis from 2/28 to 06/10/11 at [facility] totalling  19 liters.   DVT (deep venous thrombosis) (HCC) 05/2011   Hepatitis    Pulmonary embolism (HCC) 05/2011   sent home on Xarelto.    History reviewed. No pertinent family history.  SOCIAL HISTORY: Social History   Tobacco Use   Smoking status: Every Day    Current packs/day: 1.00    Average packs/day: 1 pack/day for 30.0 years (30.0 ttl pk-yrs)    Types: Cigarettes    Start date: 10/26/2007   Smokeless tobacco: Never  Substance Use Topics   Alcohol use: No    Allergies  Allergen Reactions   Acetaminophen Other (See Comments)    "Liver issues"    Current Facility-Administered Medications  Medication Dose Route Frequency Provider Last Rate Last Admin   acetaminophen (TYLENOL) tablet 650 mg  650 mg Oral Q6H PRN Antonio Mo, MD       Or   acetaminophen (TYLENOL) suppository 650 mg  650 mg Rectal Q6H PRN Antonio Mo, MD       ARIPiprazole (ABILIFY) tablet 10 mg  10 mg Oral Daily Antonio Mo, MD   10 mg at 11/01/22 1027   feeding supplement (GLUCERNA SHAKE) (GLUCERNA SHAKE) liquid 237 mL  237 mL Oral BID BM Alekh, Kshitiz, MD       gabapentin (NEURONTIN) capsule 600 mg  600 mg Oral QID Antonio Mo, MD  600 mg at 11/01/22 1027   insulin aspart (novoLOG) injection 0-15 Units  0-15 Units Subcutaneous TID WC Glade Lloyd, MD   15 Units at 11/01/22 1308   insulin aspart (novoLOG) injection 4 Units  4 Units Subcutaneous TID WC Glade Lloyd, MD   4 Units at 11/01/22 1309   insulin glargine-yfgn (SEMGLEE) injection 15 Units  15 Units Subcutaneous BID Glade Lloyd, MD       ondansetron (ZOFRAN) tablet 4 mg  4 mg Oral Q6H PRN Antonio Mo, MD       Or   ondansetron [facility]) injection 4 mg  4 mg Intravenous Q6H PRN Antonio Mo, MD       sertraline (ZOLOFT) tablet 100 mg  100 mg Oral Daily Antonio Mo, MD   100 mg at 11/01/22 1027   traZODone (DESYREL) tablet 50 mg  50 mg Oral QHS Antonio Mo, MD   50 mg at  10/31/22 2228    REVIEW OF SYSTEMS:  [X]  denotes positive finding, [ ]  denotes negative finding Cardiac  Comments:  Chest pain or chest pressure:    Shortness of breath upon exertion:    Short of breath when lying flat:    Irregular heart rhythm:        Vascular    Pain in calf, thigh, or hip brought on by ambulation:    Pain in feet at night that wakes you up from your sleep:     Blood clot in your veins:    Leg swelling:         Pulmonary    Oxygen at home:    Productive cough:     Wheezing:         Neurologic    Sudden weakness in arms or legs:     Sudden numbness in arms or legs:     Sudden onset of difficulty speaking or slurred speech:    Temporary loss of vision in one eye:     Problems with dizziness:         Gastrointestinal    Blood in stool:     Vomited blood:         Genitourinary    Burning when urinating:     Blood in urine:        Psychiatric    Major depression:         Hematologic    Bleeding problems:    Problems with blood clotting too easily:        Skin    Rashes or ulcers: x       Constitutional    Fever or chills:    -  PHYSICAL EXAM:   Vitals:   10/31/22 2210 11/01/22 0228 11/01/22 0630 11/01/22 1303  BP: 110/86 106/73 105/77 93/70  Pulse: 84 89 81 88  Resp: 16 16 16 16   Temp: 98 F (36.7 C) 97.7 F (36.5 C) 97.9 F (36.6 C) 97.8 F (36.6 C)  TempSrc:      SpO2: 99% 98% 98% 99%  Weight:      Height:       Body mass index is 17.04 kg/m. GENERAL: The patient is a well-nourished male, in no acute distress. The vital signs are documented above. CARDIAC: There is a regular rate and rhythm.  VASCULAR: The patient is in isolation and there was no stethoscope to assess carotid bruits. He has palpable femoral, popliteal, and posterior tibial pulses bilaterally. He has hyperpigmentation bilaterally consistent with chronic venous insufficiency.  PULMONARY: There is good air exchange bilaterally without wheezing or rales. ABDOMEN:  Soft and non-tender with normal pitched bowel sounds.  MUSCULOSKELETAL: There are no major deformities. NEUROLOGIC: No focal weakness or paresthesias are detected. SKIN: He has a superficial wound over his left medial malleolus. PSYCHIATRIC: The patient has a normal affect.  DATA:    ARTERIAL DOPPLER STUDY: I have independently interpreted his arterial Doppler study today.  On the right side he has a triphasic posterior tibial and dorsalis pedis signal.  ABIs 100%.  Toe pressure 68 mmHg.  On the left side he has a biphasic posterior tibial and dorsalis pedis signal.  ABIs 100%.  Toe pressure 62 mmHg.  Waverly Ferrari [facility]

## 2022-11-01 NOTE — Progress Notes (Signed)
ABI's have been completed. Preliminary results can be found in CV Proc through chart review.   11/01/22 9:36 AM Olen Cordial RVT

## 2022-11-01 NOTE — Progress Notes (Signed)
  Echocardiogram 2D Echocardiogram has been performed.  Antonio [name] 11/01/2022, 9:08 AM

## 2022-11-01 NOTE — Inpatient Diabetes Management (Signed)
Inpatient Diabetes Program Recommendations  AACE/ADA: New Consensus Statement on Inpatient Glycemic Control (2015)  Target Ranges:  Prepandial:   less than 140 mg/dL      Peak postprandial:   less than 180 mg/dL (1-2 hours)      Critically ill patients:  140 - 180 mg/dL    Latest Reference Range & Units 10/31/22 06:12 10/31/22 13:00  Glucose-Capillary 70 - 99 mg/dL 161 (H) 096 (H)  (H): Data is abnormally high    Home DM Meds: Humalog 2-8 units QID                              Lantus 12 units BID                              Metformin 750 mg BID   Current Orders: Semglee 12 units BID    Pt was getting Novolog SSI and Novolog meal coverage over at [facility]  MD- Please consider:  1. Start Novolog Moderate Correction Scale/ SSI (0-15 units) TID AC + HS  2. Start Novolog 4 units TID with meals for meal coverage    --Will follow patient during hospitalization--  Ambrose Finland RN, MSN, CDCES Diabetes Coordinator Inpatient Glycemic Control Team Team Pager: [phone] (8a-5p)

## 2022-11-01 NOTE — Progress Notes (Signed)
PT Cancellation Note  Patient Details Name: Antonio [name] MRN: [id number] DOB: 06-Aug-1970   Cancelled Treatment:    Reason Eval/Treat Not Completed: Pain limiting ability to participate. Pt declined PT 2* pain. RN notified of pt request for pain medication. Will follow. Pt reports he ambulates with a cane at baseline.    Ralene Bathe Kistler PT 11/01/2022  Acute Rehabilitation Services  Office [phone]

## 2022-11-01 NOTE — Progress Notes (Addendum)
PROGRESS NOTE    Antonio [name]  QMV:[id number] DOB: Nov 11, 1970 DOA: 10/31/2022 PCP: Pcp, No   Brief Narrative:  52 y.o. male with medical history significant of alcohol abuse, alcoholic hepatitis, ascites, history of DVT, pulmonary embolism, medication noncompliance, polysubstance tobacco use disorder, controlled type 2 diabetes, recurrent MDD with psychotic features with recent admission to behavioral health Hospital from 10/26/2022-10/31/2022 for management of suicidal ideations.  He was discharged from behavioral health to emergency department due to uncontrolled blood glucose and left foot/ankle area wound along with diarrhea.  On presentation, blood glucose was 272.  Left foot x-ray done 12 days prior to presentation had shown no acute findings.  Assessment & Plan:   Diabetes mellitus type II with hyperglycemia -Blood sugars still elevated.  Increase Semglee to 15 units twice daily.  Add NovoLog with meals and CBGs with SSI.  Diabetes coordinator following.  Carb modified diet.  Diarrhea -May be related to his chronic pancreatitis versus metformin use.  Stool for C. difficile and GI PCR pending.  Chronic left ankle wound -Admitting hospitalist discussed with general surgery who thought that the wound was healing properly: No need for biopsy at this moment -Wound care consult.  Follow ABI  Cirrhosis of liver History of alcohol abuse History of hep B and hep C Thrombocytopenia, chronic -No signs of alcohol withdrawal at this time.  Was recently in behavioral health hospital. -Check ultrasound of abdomen to rule out ascites.  Currently not volume overloaded.  Strict input and output. Daily weights.  Check ammonia level in AM. -Outpatient follow-up with GI if patient is compliant -Monitor platelets intermittently.  Currently showing no signs of bleeding.  Major depressive disorder, recurrent severe without psychotic features -Was recently hospitalized for suicidal ideation and discharged  on 10/31/2022 from [facility] on aripiprazole, sertraline and trazodone.  Continue the same.  Outpatient follow-up with psychiatry  Chronic systolic heart failure -Does not seem to be in volume overload.  Echo pending.  Strict input and output.  Daily weights.  Fluid restriction.  Currently not on any diuretics.  Hyponatremia -Possibly from hypoglycemia as well.  Monitor.  Anemia of chronic disease -From chronic illnesses.  Hemoglobin stable.  Monitor intermittently  Noncompliance Homelessness -Patient has been very noncompliant with his medical care and is currently apparently homeless.  TOC consulted for the same.  Physical deconditioning -PT eval  DVT prophylaxis: SCDs Code Status: Full Family Communication: None at bedside Disposition Plan: Status is: Observation The patient will require care spanning > 2 midnights and should be moved to inpatient because: Of severity of illness.  Consultants: None  Procedures: 2D echo pending  Antimicrobials: None   Subjective: Patient seen and examined at bedside.  Very poor historian.  No seizures, agitation, fever or vomiting reported.  Objective: Vitals:   10/31/22 1500 10/31/22 2210 11/01/22 0228 11/01/22 0630  BP: 118/85 110/86 106/73 105/77  Pulse: 80 84 89 81  Resp: 18 16 16 16   Temp: (!) 97.5 F (36.4 C) 98 F (36.7 C) 97.7 F (36.5 C) 97.9 F (36.6 C)  TempSrc: Oral     SpO2: 99% 99% 98% 98%  Weight: 57 kg     Height: 6' (1.829 m)       Intake/Output Summary (Last 24 hours) at 11/01/2022 1040 Last data filed at 11/01/2022 0931 Gross per 24 hour  Intake 1309.53 ml  Output 1125 ml  Net 184.53 ml   Filed Weights   10/31/22 1500  Weight: 57 kg    Examination:  General  exam: Appears calm and comfortable.  Looks chronically ill and deconditioned.  On room air. Respiratory system: Bilateral decreased breath sounds at bases with some scattered crackles Cardiovascular system: S1 & S2 heard, Rate  controlled Gastrointestinal system: Abdomen is slightly distended, soft and nontender. Normal bowel sounds heard. Extremities: No cyanosis, clubbing, edema  Central nervous system: Awake, extremely slow to respond, poor historian, answers some questions.  No focal neurological deficits. Moving extremities Skin: Left medial malleolus healing ulcer present with no erythema or discharge Psychiatry: Not agitated.  Flat affect.    Data Reviewed: I have personally reviewed following labs and imaging studies  CBC: Recent Labs  Lab 10/25/22 1830 10/31/22 0646 10/31/22 1235 11/01/22 0604  WBC 5.3 3.6* 3.9* 5.3  NEUTROABS 3.1 1.9 2.3  --   HGB 12.6* 11.5* 11.1* 10.9*  HCT 37.1* 34.4* 32.8* 31.7*  MCV 90.5 93.7 92.4 91.1  PLT 141* 104* 106* 96*   Basic Metabolic Panel: Recent Labs  Lab 10/25/22 1830 10/28/22 0701 10/31/22 0646 10/31/22 1235 11/01/22 0713  NA 137 134* 133* 132* 128*  K 3.9 4.2 4.3 4.3 4.7  CL 102 104 98 99 95*  CO2 26 20* 25 23 25   GLUCOSE 157* 495* 337* 272* 375*  BUN 26* 23* 24* 20 22*  CREATININE 0.87 0.93 0.77 0.65 0.80  CALCIUM 9.1 8.5* 8.7* 8.6* 8.3*   GFR: Estimated Creatinine Clearance: 89.1 mL/min (by C-G formula based on SCr of 0.8 mg/dL). Liver Function Tests: Recent Labs  Lab 10/28/22 0651 10/31/22 0646 10/31/22 1235 11/01/22 0713  AST 42* 37 35 38  ALT 35 36 34 31  ALKPHOS 128* 108 103 99  BILITOT 0.4 0.6 0.4 0.9  PROT 7.1 7.0 6.7 6.6  ALBUMIN 3.5 3.6 3.4* 3.3*   Recent Labs  Lab 10/31/22 1235  LIPASE 30   No results for input(s): "AMMONIA" in the last 168 hours. Coagulation Profile: No results for input(s): "INR", "PROTIME" in the last 168 hours. Cardiac Enzymes: No results for input(s): "CKTOTAL", "CKMB", "CKMBINDEX", "TROPONINI" in the last 168 hours. BNP (last 3 results) No results for input(s): "PROBNP" in the last 8760 hours. HbA1C: No results for input(s): "HGBA1C" in the last 72 hours. CBG: Recent Labs  Lab  10/30/22 1649 10/30/22 2204 10/31/22 0612 10/31/22 1300 11/01/22 0924  GLUCAP 94 218* 287* 255* 369*   Lipid Profile: No results for input(s): "CHOL", "HDL", "LDLCALC", "TRIG", "CHOLHDL", "LDLDIRECT" in the last 72 hours. Thyroid Function Tests: Recent Labs    10/31/22 0646  FREET4 0.85  T3FREE 2.5   Anemia Panel: No results for input(s): "VITAMINB12", "FOLATE", "FERRITIN", "TIBC", "IRON", "RETICCTPCT" in the last 72 hours. Sepsis Labs: No results for input(s): "PROCALCITON", "LATICACIDVEN" in the last 168 hours.  No results found for this or any previous visit (from the past 240 hour(s)).       Radiology Studies:  Scheduled Meds:  ARIPiprazole  10 mg Oral Daily   feeding supplement  237 mL Oral BID BM   gabapentin  600 mg Oral QID   insulin aspart  0-15 Units Subcutaneous TID WC   insulin aspart  4 Units Subcutaneous TID WC   insulin glargine-yfgn  12 Units Subcutaneous BID   sertraline  100 mg Oral Daily   traZODone  50 mg Oral QHS   Continuous Infusions:  sodium chloride 75 mL/hr at 10/31/22 1549          Deyanira Fesler, MD [facility] 11/01/2022, 10:40 AM

## 2022-11-01 NOTE — Plan of Care (Signed)

## 2022-11-02 ENCOUNTER — Other Ambulatory Visit (HOSPITAL_COMMUNITY): Payer: Self-pay

## 2022-11-02 DIAGNOSIS — S91002A Unspecified open wound, left ankle, initial encounter: Secondary | ICD-10-CM | POA: Diagnosis not present

## 2022-11-02 DIAGNOSIS — I5022 Chronic systolic (congestive) heart failure: Secondary | ICD-10-CM | POA: Diagnosis not present

## 2022-11-02 DIAGNOSIS — K7460 Unspecified cirrhosis of liver: Secondary | ICD-10-CM | POA: Diagnosis not present

## 2022-11-02 DIAGNOSIS — R197 Diarrhea, unspecified: Secondary | ICD-10-CM | POA: Diagnosis not present

## 2022-11-02 LAB — GLUCOSE, CAPILLARY
Glucose-Capillary: 215 mg/dL — ABNORMAL HIGH (ref 70–99)
Glucose-Capillary: 246 mg/dL — ABNORMAL HIGH (ref 70–99)

## 2022-11-02 MED ORDER — LANTUS SOLOSTAR 100 UNIT/ML ~~LOC~~ SOPN
15.0000 [IU] | PEN_INJECTOR | Freq: Two times a day (BID) | SUBCUTANEOUS | 0 refills | Status: AC
  Filled 2022-11-02: qty 9, 30d supply, fill #0

## 2022-11-02 MED ORDER — LANTUS SOLOSTAR 100 UNIT/ML ~~LOC~~ SOPN: 15.0000 [IU] | PEN_INJECTOR | Freq: Two times a day (BID) | SUBCUTANEOUS | Status: DC

## 2022-11-02 NOTE — Evaluation (Signed)
Physical Therapy Evaluation Patient Details Name: Antonio [name] MRN: [id number] DOB: 1970/08/06 Today's Date: 11/02/2022  History of Present Illness  52 y.o. male with medical history significant of alcohol abuse, alcoholic hepatitis, ascites, history of DVT, pulmonary embolism, medication noncompliance, polysubstance tobacco use disorder, controlled type 2 diabetes, recurrent MDD with psychotic features was discharged from behavioral health to the emergency department due to uncontrolled blood glucose and left foot/ankle area wound.  Clinical Impression  Pt admitted with above diagnosis.  Pt currently with functional limitations due to the deficits listed below (see PT Problem List). Pt will benefit from acute skilled PT to increase their independence and safety with mobility to allow discharge.     The patient  lost balance while ambulating without support, PT required to  steady patient. Patient reports ambulating with a cane.  No belongings nor cane found in patient's room. Patient states they are at [facility]. RN aware of missing belongings.  Patient currently high fall risk and needs a cane for balance. Continue PT.      Assistance Recommended at Discharge PRN  If plan is discharge home, recommend the following:  Can travel by private vehicle  Assistance with cooking/housework;Help with stairs or ramp for entrance    yes    Equipment Recommendations  (cane if he cannot get his back)  Recommendations for Other Services       Functional Status Assessment Patient has had a recent decline in their functional status and demonstrates the ability to make significant improvements in function in a reasonable and predictable amount of time.     Precautions / Restrictions Precautions Precautions: Fall Restrictions Weight Bearing Restrictions: No      Mobility  Bed Mobility Overal bed mobility: Independent                  Transfers Overall transfer level: Needs assistance    Transfers: Sit to/from Stand Sit to Stand: Min guard           General transfer comment: quickly stood as patient stated needed to go to BR. Steady assist, no device    Ambulation/Gait Ambulation/Gait assistance: Min assist, Min guard Gait Distance (Feet): 60 Feet Assistive device: Rolling walker (2 wheels), None Gait Pattern/deviations: Step-through pattern, Drifts right/left, Staggering right, Staggering left Gait velocity: decr     General Gait Details: steady assist to BR x 20', then another 20, balnace loss with thherapist supporting, RW provided.  Stairs            Wheelchair Mobility     Tilt Bed    Modified Rankin (Stroke Patients Only)       Balance Overall balance assessment: Needs assistance, History of Falls Sitting-balance support: Feet supported, No upper extremity supported Sitting balance-Leahy Scale: Good     Standing balance support: During functional activity, No upper extremity supported Standing balance-Leahy Scale: Poor                               Pertinent Vitals/Pain Pain Assessment Pain Assessment: Faces Faces Pain Scale: Hurts little more Pain Location: left foot Pain Descriptors / Indicators: Discomfort Pain Intervention(s): Limited activity within patient's tolerance    Home Living Family/patient expects to be discharged to:: Shelter/Homeless                   Additional Comments: states he has no "camp, no tent"    Prior Function Prior Level of Function : Independent/Modified  Independent             Mobility Comments: uses cane       Hand Dominance   Dominant Hand: Right    Extremity/Trunk Assessment   Upper Extremity Assessment Upper Extremity Assessment: Overall WFL for tasks assessed    Lower Extremity Assessment Lower Extremity Assessment: LLE deficits/detail LLE Deficits / Details: decreased ROM at the ankle    Cervical / Trunk Assessment Cervical / Trunk Assessment:  Kyphotic  Communication   Communication: Other (comment) (dysarthric)  Cognition Arousal/Alertness: Awake/alert Behavior During Therapy: Flat affect Overall Cognitive Status: Difficult to assess                                 General Comments: follows directions, speech is garbled        General Comments      Exercises     Assessment/Plan    PT Assessment Patient needs continued PT services  PT Problem List Decreased strength;Decreased range of motion;Pain;Decreased activity tolerance;Decreased balance;Decreased mobility       PT Treatment Interventions DME instruction;Therapeutic exercise;Gait training;Functional mobility training;Therapeutic activities;Patient/family education    PT Goals (Current goals can be found in the Care Plan section)  Acute Rehab PT Goals Patient Stated Goal: get my cane and clothes PT Goal Formulation: With patient Time For Goal Achievement: 11/16/22 Potential to Achieve Goals: Fair    Frequency Min 1X/week     Co-evaluation               AM-PAC PT "6 Clicks" Mobility  Outcome Measure Help needed turning from your back to your side while in a flat bed without using bedrails?: None Help needed moving from lying on your back to sitting on the side of a flat bed without using bedrails?: None Help needed moving to and from a bed to a chair (including a wheelchair)?: A Little Help needed standing up from a chair using your arms (e.g., wheelchair or bedside chair)?: A Little Help needed to walk in hospital room?: A Lot Help needed climbing 3-5 steps with a railing? : Total 6 Click Score: 17    End of Session Equipment Utilized During Treatment: Gait belt Activity Tolerance: Patient tolerated treatment well Patient left: in bed;with call bell/phone within reach;with bed alarm set Nurse Communication: Mobility status PT Visit Diagnosis: Unsteadiness on feet (R26.81);History of falling (Z91.81);Pain Pain - Right/Left:  Left Pain - part of body: Ankle and joints of foot    Time: 1025-1049 PT Time Calculation (min) (ACUTE ONLY): 24 min   Charges:   PT Evaluation $PT Eval Low Complexity: 1 Low PT Treatments $Gait Training: 8-22 mins PT General Charges $$ ACUTE PT VISIT: 1 Visit         Blanchard Kelch PT Acute Rehabilitation Services Office [phone] Weekend [phone]   Rada Hay 11/02/2022, 12:20 PM

## 2022-11-02 NOTE — TOC Transition Note (Signed)
Transition of Care [facility]) - CM/SW Discharge Note   Patient Details  Name: Antonio [name] MRN: [id number] Date of Birth: 03-09-1971  Transition of Care [facility]) CM/SW Contact:  Adrian Prows, RN Phone Number: 11/02/2022, 11:19 AM   Clinical Narrative:    D/C orders received; notified pt needs transportation; spoke w/ pt in room; inquired if pt can ride bus; he says he would get lost on bus; he verified d/c address: [address] [place], [place] [address]; spoke w/ Honey at [facility] taxi and arranged 12 noon pt pick up at [facility] main entrance; taxi voucher given to [place], [place]; pr notified and agrees to d/c plan; Therapist, nutritional and Release of Liability signed by pt and placed in shadow chart on unit; Lisette Abu, RN also notified; no TOC needs.   Final next level of care: Home/Self Care Barriers to Discharge: No Barriers Identified   Patient Goals and CMS Choice CMS Medicare.gov Compare Post Acute Care list provided to:: Patient Choice offered to / list presented to : Patient  Discharge Placement                         Discharge Plan and Services Additional resources added to the After Visit Summary for   In-house Referral: Clinical Social Work Discharge Planning Services: NA Post Acute Care Choice: NA          DME Arranged: N/A DME Agency: NA                  Social Determinants of Health (SDOH) Interventions SDOH Screenings   Food Insecurity: Food Insecurity Present (10/31/2022)  Housing: Patient Declined (10/31/2022)  Transportation Needs: Unmet Transportation Needs (11/02/2022)  Utilities: Not At Risk (10/31/2022)  Alcohol Screen: Low Risk  (10/27/2022)  Financial Resource Strain: Low Risk  (04/12/2022)   Received from [facility], [facility]  Social Connections: Unknown (08/21/2021)   Received from [facility], [facility]  Stress: No Stress Concern Present (01/25/2021)   Received from [facility], [facility]  Tobacco Use: High Risk  (10/31/2022)  Health Literacy: Medium Risk (10/01/2020)   Received from [facility], [facility]     Readmission Risk Interventions     No data to display

## 2022-11-02 NOTE — Discharge Summary (Signed)
Physician Discharge Summary  Antonio [name]:[id number] DOB: 1970/11/05 DOA: 10/31/2022  PCP: Pcp, No  Admit date: 10/31/2022 Discharge date: 11/02/2022  Recommendations for Outpatient Follow-up:  Follow up with PCP in 1 week with repeat CBC/BMP Outpatient follow-up with psychiatry Outpatient follow-up with GI Comply with medications and follow-up Abstain from illicit drug use Recommend outpatient evaluation for and follow-up with palliative care if continues to be noncompliant.   Follow up in ED if symptoms worsen or new appear   Home Health: No Equipment/Devices: None  Discharge Condition: Stable CODE STATUS: Full Diet recommendation: Heart healthy  Brief/Interim Summary: 52 y.o. male with medical history significant of alcohol abuse, alcoholic hepatitis, ascites, history of DVT, pulmonary embolism, medication noncompliance, polysubstance tobacco use disorder, controlled type 2 diabetes, recurrent MDD with psychotic features with recent admission to behavioral health Hospital from 10/26/2022-10/31/2022 for management of suicidal ideations.  He was discharged from behavioral health to emergency department due to uncontrolled blood glucose and left foot/ankle area wound along with diarrhea.  On presentation, blood glucose was 272.  Left foot x-ray done 12 days prior to presentation had shown no acute findings.  During the hospitalization, wound care evaluated his left ankle wound.  Vascular surgery also evaluated the wound and ABI and recommended that patient stop smoking and did not recommend any vascular surgery intervention.  Blood sugars are still labile but improving.  Patient is homeless and TOC has given him resources.  Patient will be discharged today with outpatient follow-up with PCP/psychiatry.  Patient is to be compliant with medications and follow-up.  Discharge Diagnoses:   Diabetes mellitus type II with hyperglycemia -Blood sugars are labile with hyperglycemia and some low  blood sugar readings.   Diabetes coordinator follow-up appreciated.  Carb modified diet.  Continue long-acting insulin along with short acting insulin per sliding scale.   Diarrhea -May be related to his chronic pancreatitis versus metformin use.  No diarrhea since admission.  Stool testing discontinued.  Chronic left ankle wound -Admitting hospitalist discussed with general surgery who thought that the wound was healing properly: No need for biopsy at this moment -Wound care as per care consult recommendations.  ABIs showed abnormal left toe brachial index.   - Vascular surgery also evaluated the wound and ABI and recommended that patient stop smoking and did not recommend any vascular surgery intervention.    Cirrhosis of liver History of alcohol abuse History of hep B and hep C Thrombocytopenia, chronic -No signs of alcohol withdrawal at this time.  Was recently in behavioral health hospital. -Ultrasound of abdomen did not show significant ascites for paracentesis.  Currently not volume overloaded.  Continue diet and fluid restrictions.  Ammonia not elevated. -Outpatient follow-up with GI if patient is compliant -Monitor platelets intermittently as an outpatient.  Currently showing no signs of bleeding.   Major depressive disorder, recurrent severe without psychotic features -Was recently hospitalized for suicidal ideation and discharged on 10/31/2022 from [facility] on aripiprazole, sertraline and trazodone.  Continue the same.  Outpatient follow-up with psychiatry   Chronic systolic heart failure -Does not seem to be in volume overload.  Echo showed EF of 55 to 60% with indeterminate left ventricular diastolic parameters.  Continue diet and fluid restriction.  Currently not on any diuretics.  Outpatient follow-up with PCP and/or cardiology.  Hyponatremia -Sodium still 128 this morning.  Continue fluid restriction.  Outpatient follow-up with PCP with repeat BMP.   Anemia of chronic  disease -From chronic illnesses.  Hemoglobin stable.  Monitor intermittently  Noncompliance Homelessness -Patient has been very noncompliant with his medical care and is currently apparently homeless.  TOC has given him resources regarding the same.    Discharge Instructions  Discharge Instructions     Ambulatory referral to Gastroenterology   Complete by: As directed    Hospital follow up   What is the reason for referral?: Other   Diet - low sodium heart healthy   Complete by: As directed    Diet Carb Modified   Complete by: As directed    Discharge wound care:   Complete by: As directed    As per wound care RN's recommendations: Wound care  Every other day    Comments: Cleanse ankle wound with saline, pat dry. Cut to fit silver hydrofiber (Aquacel AG+) lawson # [id number]. Cover with dry dressing or foam. Change every other day.   Increase activity slowly   Complete by: As directed       Allergies as of 11/02/2022       Reactions   Acetaminophen Other (See Comments)   "Liver issues"        Medication List     TAKE these medications    ARIPiprazole 10 MG tablet Commonly known as: ABILIFY Take 10 mg by mouth daily.   gabapentin 600 MG tablet Commonly known as: NEURONTIN Take 600 mg by mouth 4 (four) times daily.   HumaLOG KwikPen 100 UNIT/ML KwikPen Generic drug: insulin lispro Inject 2-8 Units into the skin See admin instructions. Inject 2-8 units into the skin four times a day, per sliding scale: BGL 140-180 = 2 units; 181-240 = 4 units; 241-300 = 6 units; 301-350 = 8 units   Lantus SoloStar 100 UNIT/ML Solostar Pen Generic drug: insulin glargine Inject 15 Units into the skin 2 (two) times daily. What changed: how much to take   metFORMIN 750 MG 24 hr tablet Commonly known as: GLUCOPHAGE-XR Take 1 tablet (750 mg total) by mouth 2 (two) times daily with a meal.   sertraline 100 MG tablet Commonly known as: ZOLOFT Take 1 tablet (100 mg total) by mouth  daily.   traZODone 50 MG tablet Commonly known as: DESYREL Take 50 mg by mouth at bedtime.               Discharge Care Instructions  (From admission, onward)           Start     Ordered   11/02/22 0000  Discharge wound care:       Comments: As per wound care RN's recommendations: Wound care  Every other day    Comments: Cleanse ankle wound with saline, pat dry. Cut to fit silver hydrofiber (Aquacel AG+) lawson # [id number]. Cover with dry dressing or foam. Change every other day.   11/02/22 0920            Follow-up Information     [facility]. Schedule an appointment as soon as possible for a visit.   Specialty: Family Medicine Why: Call to schedule appointment to establish primary care services. This provider offers sliding-scale for payment if no isnurance is provided. Contact information: [address] [place] [address] [phone]         [facility] Follow up.   Why: Can call to schedule appointment at this location. Initial visit cost is $120 and follow up visits are $65. Contact information: [address] [place] [address] [phone]         Psychiatrist. Schedule an  appointment as soon as possible for a visit in 1 week(s).                 Allergies  Allergen Reactions   Acetaminophen Other (See Comments)    "Liver issues"    Consultations: Vascular surgery   Procedures/Studies: US Abdomen Complete  Result Date: 11/02/2022 CLINICAL DATA:  52 year old male with history of cirrhosis. EXAM: ABDOMEN ULTRASOUND COMPLETE COMPARISON:  Right upper quadrant abdominal ultrasound 02/16/2020. FINDINGS: Gallbladder: Status post cholecystectomy. Common bile duct: Diameter: 11 mm Liver: No focal lesion identified. Liver has a nodular contour and mild diffuse heterogeneous echogenicity, compatible with reported history of cirrhosis. Portal vein is patent on color Doppler imaging with normal direction of  blood flow towards the liver. IVC: No abnormality visualized. Pancreas: Visualized portion unremarkable. Spleen: Enlarged measuring 15.4 x 15.1 x 6.7 cm (estimated splenic volume of 779 mL). Right Kidney: Length: 8.1 cm. Echogenicity within normal limits. No mass or hydronephrosis visualized. Left Kidney: Length: 11.7 cm. Echogenicity within normal limits. No mass or hydronephrosis visualized. Abdominal aorta: No aneurysm visualized. Other findings: None. IMPRESSION: 1. Dilatation of the common bile duct which measures 11 mm. This could suggest post cholecystectomy physiology. If there is any clinical concern for biliary tract obstruction, further evaluation with abdominal MRI with and without IV gadolinium with MRCP should be considered. 2. Morphologic changes in the liver indicative of underlying cirrhosis, as above. 3. Splenomegaly, suggesting portal venous hypertension. Electronically Signed   By: Trudie Reed M.D.   On: 11/02/2022 08:30   VAS [place] ABI WITH/WO TBI  Result Date: 11/01/2022  LOWER EXTREMITY DOPPLER STUDY Patient Name:  Antonio [name]  Date of Exam:   11/01/2022 Medical Rec #: [id number]      Accession #:    [id number] Date of Birth: 27-Mar-1971       Patient Gender: M Patient Age:   49 years Exam Location:  [facility] Procedure:      VAS [place] ABI WITH/WO TBI Referring Phys: DAVID ORTIZ --------------------------------------------------------------------------------  Indications: Ulceration. High Risk Factors: Diabetes, current smoker.  Limitations: Today's exam was limited due to bandages and Left upper arm IV. Comparison Study: No prior studies. Performing Technologist: Olen Cordial RVT  Examination Guidelines: A complete evaluation includes at minimum, Doppler waveform signals and systolic blood pressure reading at the level of bilateral brachial, anterior tibial, and posterior tibial arteries, when vessel segments are accessible. Bilateral testing is considered an integral part of a  complete examination. Photoelectric Plethysmograph (PPG) waveforms and toe systolic pressure readings are included as required and additional duplex testing as needed. Limited examinations for reoccurring indications may be performed as noted.  ABI Findings: +---------+------------------+-----+---------+--------+ Right    Rt Pressure (mmHg)IndexWaveform Comment  +---------+------------------+-----+---------+--------+ Brachial 93                     triphasic         +---------+------------------+-----+---------+--------+ PTA      100               1.08 triphasic         +---------+------------------+-----+---------+--------+ DP       88                0.95 triphasic         +---------+------------------+-----+---------+--------+ Great Toe68                0.73                   +---------+------------------+-----+---------+--------+ +---------+------------------+-----+-----------+-------+  Left     Lt Pressure (mmHg)IndexWaveform   Comment +---------+------------------+-----+-----------+-------+ Brachial                                   IV      +---------+------------------+-----+-----------+-------+ PTA      99                1.06 multiphasic        +---------+------------------+-----+-----------+-------+ DP       75                0.81 multiphasic        +---------+------------------+-----+-----------+-------+ Great Toe62                0.67                    +---------+------------------+-----+-----------+-------+ +-------+-----------+-----------+------------+------------+ ABI/TBIToday's ABIToday's TBIPrevious ABIPrevious TBI +-------+-----------+-----------+------------+------------+ Right  1.08       0.73                                +-------+-----------+-----------+------------+------------+ Left   1.06       0.67                                +-------+-----------+-----------+------------+------------+  Summary: Right: Resting  right ankle-brachial index is within normal range. The right toe-brachial index is normal. Left: Resting left ankle-brachial index is within normal range. The left toe-brachial index is abnormal. *See table(s) above for measurements and observations.  Electronically signed by Waverly Ferrari MD on 11/01/2022 at 4:16:16 PM.    Final    ECHOCARDIOGRAM COMPLETE  Result Date: 11/01/2022    ECHOCARDIOGRAM REPORT   Patient Name:   ROODY THEBO Date of Exam: 11/01/2022 Medical Rec #:  [id number]     Height:       72.0 in Accession #:    [id number]    Weight:       125.7 lb Date of Birth:  [birth year]      BSA:          1.749 m Patient Age:    52 years      BP:           105/77 mmHg Patient Gender: M             HR:           74 bpm. Exam Location:  Inpatient Procedure: 2D Echo, 3D Echo, Cardiac Doppler and Color Doppler Indications:    I50.40* Unspecified combined systolic (congestive) and diastolic                 (congestive) heart failure  History:        Patient has no prior history of Echocardiogram examinations.                 CHF, Abnormal ECG, Arrythmias:Tachycardia,                 Signs/Symptoms:Altered Mental Status; Risk Factors:Diabetes and                 Current Smoker. ETOH. IVDU. Pulmonary embolus.  Sonographer:    Sheralyn Boatman RDCS Referring Phys: [id number] DAVID MANUEL ORTIZ  Sonographer Comments: Technically difficult study due to poor echo windows and suboptimal parasternal window. Very thin habitus with  extrememly low windows. IMPRESSIONS  1. Left ventricular ejection fraction, by estimation, is 55 to 60%. The left ventricle has normal function. The left ventricle has no regional wall motion abnormalities. Left ventricular diastolic parameters are indeterminate.  2. Right ventricular systolic function is normal. The right ventricular size is normal.  3. Left atrial size was mildly dilated.  4. Thickened subvalvular apparatus Minimal anterior motion of mitral chordae.. The mitral valve is degenerative.  Trivial mitral valve regurgitation. No evidence of mitral stenosis.  5. The non coronary cusp appears to have restricted mobility. . The aortic valve was not well visualized. Aortic valve regurgitation is not visualized. No aortic stenosis is present.  6. Aortic dilatation noted. There is mild dilatation of the aortic root, measuring 40 mm.  7. The inferior vena cava is normal in size with greater than 50% respiratory variability, suggesting right atrial pressure of 3 mmHg. Comparison(s): No prior Echocardiogram. FINDINGS  Left Ventricle: Left ventricular ejection fraction, by estimation, is 55 to 60%. The left ventricle has normal function. The left ventricle has no regional wall motion abnormalities. The left ventricular internal cavity size was normal in size. There is  no left ventricular hypertrophy. Left ventricular diastolic parameters are indeterminate. Right Ventricle: The right ventricular size is normal. No increase in right ventricular wall thickness. Right ventricular systolic function is normal. Left Atrium: Left atrial size was mildly dilated. Right Atrium: Right atrial size was normal in size. Pericardium: There is no evidence of pericardial effusion. Mitral Valve: Thickened subvalvular apparatus Minimal anterior motion of mitral chordae. The mitral valve is degenerative in appearance. There is mild thickening of the mitral valve leaflet(s). Trivial mitral valve regurgitation. No evidence of mitral valve stenosis. Tricuspid Valve: The tricuspid valve is grossly normal. Tricuspid valve regurgitation is trivial. Aortic Valve: The non coronary cusp appears to have restricted mobility. The aortic valve was not well visualized. Aortic valve regurgitation is not visualized. No aortic stenosis is present. Pulmonic Valve: The pulmonic valve was normal in structure. Pulmonic valve regurgitation is not visualized. No evidence of pulmonic stenosis. Aorta: Aortic dilatation noted. There is mild dilatation of the  aortic root, measuring 40 mm. Venous: The inferior vena cava is normal in size with greater than 50% respiratory variability, suggesting right atrial pressure of 3 mmHg. IAS/Shunts: No atrial level shunt detected by color flow Doppler.  LEFT VENTRICLE PLAX 2D LVIDd:         3.70 cm     Diastology LVIDs:         2.70 cm     LV e' medial:    5.55 cm/s LV PW:         0.90 cm     LV E/e' medial:  10.8 LV IVS:        0.80 cm     LV e' lateral:   9.68 cm/s LVOT diam:     2.30 cm     LV E/e' lateral: 6.2 LV SV:         55 LV SV Index:   31 LVOT Area:     4.15 cm  LV Volumes (MOD) LV vol d, MOD A2C: 81.1 ml LV vol d, MOD A4C: 57.3 ml LV vol s, MOD A2C: 33.8 ml LV vol s, MOD A4C: 23.9 ml LV SV MOD A2C:     47.3 ml LV SV MOD A4C:     57.3 ml LV SV MOD BP:      41.1 ml RIGHT VENTRICLE  IVC RV S prime:     10.60 cm/s  IVC diam: 0.80 cm TAPSE (M-mode): 2.0 cm LEFT ATRIUM             Index        RIGHT ATRIUM          Index LA diam:        1.90 cm 1.09 cm/m   RA Area:     8.79 cm LA Vol (A2C):   41.2 ml 23.56 ml/m  RA Volume:   16.00 ml 9.15 ml/m LA Vol (A4C):   32.0 ml 18.30 ml/m LA Biplane Vol: 39.3 ml 22.47 ml/m  AORTIC VALVE LVOT Vmax:   70.80 cm/s LVOT Vmean:  43.200 cm/s LVOT VTI:    0.132 m  AORTA Ao Root diam: 4.00 cm MITRAL VALVE               TRICUSPID VALVE MV Area (PHT): 3.37 cm    TR Peak grad:   16.0 mmHg MV Decel Time: 225 msec    TR Vmax:        200.00 cm/s MV E velocity: 60.00 cm/s MV A velocity: 46.70 cm/s  SHUNTS MV E/A ratio:  1.28        Systemic VTI:  0.13 m                            Systemic Diam: 2.30 cm Dietrich Pates MD Electronically signed by Dietrich Pates MD Signature Date/Time: 11/01/2022/12:01:44 PM    Final    DG Chest Port 1 View  Result Date: 10/19/2022 CLINICAL DATA:  Weakness EXAM: PORTABLE CHEST 1 VIEW COMPARISON:  10/17/2022 FINDINGS: Cardiac shadow is within normal limits. Lungs are well aerated bilaterally. No focal infiltrate or effusion is seen. Skin fold is noted over the  left chest. No bony abnormality is noted. IMPRESSION: No active disease. Electronically Signed   By: Alcide Clever M.D.   On: 10/19/2022 19:41   DG Foot 2 Views Left  Result Date: 10/19/2022 CLINICAL DATA:  Diabetic foot wound. EXAM: LEFT FOOT - 2 VIEW COMPARISON:  None Available. FINDINGS: There is no evidence of fracture or dislocation. No evidence of osteolysis or periostitis. Generalized osteopenia noted. Subtalar joint fusion noted, with surgical pin in medial malleolus of the ankle. Soft tissues are unremarkable. IMPRESSION: No acute findings.  No radiographic evidence of osteomyelitis. Electronically Signed   By: Danae Orleans M.D.   On: 10/19/2022 14:29      Subjective: Patient seen and examined at bedside.  Very poor historian.  No seizures, vomiting, agitation reported.  Discharge Exam: Vitals:   11/01/22 1303 11/02/22 0502  BP: 93/70 98/71  Pulse: 88 85  Resp: 16 18  Temp: 97.8 F (36.6 C) 98.7 F (37.1 C)  SpO2: 99% 94%    General: Pt is alert, awake, not in acute distress.  Looks chronically ill and deconditioned.  Extremely slow to respond.  Flat affect.  On room air.   Cardiovascular: rate controlled, S1/S2 + Respiratory: bilateral decreased breath sounds at bases Abdominal: Soft, NT, ND, bowel sounds + Extremities: no edema, no cyanosis    The results of significant diagnostics from this hospitalization (including imaging, microbiology, ancillary and laboratory) are listed below for reference.     Microbiology: No results found for this or any previous visit (from the past 240 hour(s)).   Labs: BNP (last 3 results) No results for input(s): "BNP" in the last 8760 hours.  Basic Metabolic Panel: Recent Labs  Lab 10/28/22 0701 10/31/22 0646 10/31/22 1235 11/01/22 0713 11/02/22 0706  NA 134* 133* 132* 128* 128*  K 4.2 4.3 4.3 4.7 4.3  CL 104 98 99 95* 95*  CO2 20* 25 23 25 25   GLUCOSE 495* 337* 272* 375* 224*  BUN 23* 24* 20 22* 26*  CREATININE 0.93 0.77  0.65 0.80 0.77  CALCIUM 8.5* 8.7* 8.6* 8.3* 8.3*  MG  --   --   --   --  1.8   Liver Function Tests: Recent Labs  Lab 10/28/22 0651 10/31/22 0646 10/31/22 1235 11/01/22 0713 11/02/22 0706  AST 42* 37 35 38 45*  ALT 35 36 34 31 36  ALKPHOS 128* 108 103 99 94  BILITOT 0.4 0.6 0.4 0.9 0.7  PROT 7.1 7.0 6.7 6.6 6.4*  ALBUMIN 3.5 3.6 3.4* 3.3* 3.3*   Recent Labs  Lab 10/31/22 1235  LIPASE 30   Recent Labs  Lab 11/02/22 0706  AMMONIA 17   CBC: Recent Labs  Lab 10/31/22 0646 10/31/22 1235 11/01/22 0604 11/02/22 0706  WBC 3.6* 3.9* 5.3 6.2  NEUTROABS 1.9 2.3  --  4.4  HGB 11.5* 11.1* 10.9* 11.5*  HCT 34.4* 32.8* 31.7* 33.8*  MCV 93.7 92.4 91.1 90.9  PLT 104* 106* 96* 98*   Cardiac Enzymes: No results for input(s): "CKTOTAL", "CKMB", "CKMBINDEX", "TROPONINI" in the last 168 hours. BNP: Invalid input(s): "POCBNP" CBG: Recent Labs  Lab 11/01/22 0924 11/01/22 1150 11/01/22 1714 11/01/22 2041 11/02/22 0737  GLUCAP 369* 360* 135* 98 215*   D-Dimer No results for input(s): "DDIMER" in the last 72 hours. Hgb A1c No results for input(s): "HGBA1C" in the last 72 hours. Lipid Profile No results for input(s): "CHOL", "HDL", "LDLCALC", "TRIG", "CHOLHDL", "LDLDIRECT" in the last 72 hours. Thyroid function studies Recent Labs    10/31/22 0646  T3FREE 2.5   Anemia work up No results for input(s): "VITAMINB12", "FOLATE", "FERRITIN", "TIBC", "IRON", "RETICCTPCT" in the last 72 hours. Urinalysis    Component Value Date/Time   COLORURINE YELLOW 10/31/2022 1206   APPEARANCEUR CLEAR 10/31/2022 1206   LABSPEC 1.017 10/31/2022 1206   PHURINE 6.0 10/31/2022 1206   GLUCOSEU >=500 (A) 10/31/2022 1206   HGBUR NEGATIVE 10/31/2022 1206   BILIRUBINUR NEGATIVE 10/31/2022 1206   KETONESUR NEGATIVE 10/31/2022 1206   PROTEINUR NEGATIVE 10/31/2022 1206   UROBILINOGEN 0.2 07/09/2011 1341   NITRITE NEGATIVE 10/31/2022 1206   LEUKOCYTESUR NEGATIVE 10/31/2022 1206   Sepsis  Labs Recent Labs  Lab 10/31/22 0646 10/31/22 1235 11/01/22 0604 11/02/22 0706  WBC 3.6* 3.9* 5.3 6.2   Microbiology No results found for this or any previous visit (from the past 240 hour(s)).   Time coordinating discharge: 35 minutes  SIGNED:   Glade Lloyd, MD  [facility] 11/02/2022, 9:21 AM

## 2022-11-02 NOTE — Progress Notes (Signed)
Received a call from [facility] [address] [place] requesting patient's belongings be sent via [facility] security to patient's room. Items located in locker 4 match items listed on belongings record. A white plastic belongings bag with a t-shirt, cigarettes, lighter, sneakers, ball cap and a walking cane sent with security to patient's room. Pt's nurse called and notified items were on their way over.  Rosey Bath, RN

## 2022-11-04 ENCOUNTER — Encounter: Payer: Self-pay | Admitting: Internal Medicine

## 2022-11-11 ENCOUNTER — Other Ambulatory Visit (HOSPITAL_COMMUNITY): Payer: Self-pay

## 2022-11-19 ENCOUNTER — Encounter (HOSPITAL_BASED_OUTPATIENT_CLINIC_OR_DEPARTMENT_OTHER): Payer: MEDICAID | Attending: Internal Medicine | Admitting: Internal Medicine

## 2023-01-20 DIAGNOSIS — R7881 Bacteremia: Secondary | ICD-10-CM | POA: Diagnosis not present

## 2023-01-22 DIAGNOSIS — G931 Anoxic brain damage, not elsewhere classified: Secondary | ICD-10-CM | POA: Diagnosis not present

## 2023-01-22 DIAGNOSIS — R7881 Bacteremia: Secondary | ICD-10-CM | POA: Diagnosis not present

## 2023-01-22 DIAGNOSIS — Z22322 Carrier or suspected carrier of Methicillin resistant Staphylococcus aureus: Secondary | ICD-10-CM | POA: Diagnosis not present

## 2023-01-22 DIAGNOSIS — F1910 Other psychoactive substance abuse, uncomplicated: Secondary | ICD-10-CM | POA: Diagnosis not present

## 2023-06-13 IMAGING — CT CT ANGIO CHEST
2 of 6 series · 18 of 46 positions shown · IV contrast (APPLIED)
Comparison: One-view chest x-ray 09/22/2021.  CTA chest 10/31/2019

CLINICAL DATA: Pulmonary embolism suspected. High probability of
septic emboli. Possible PE.

EXAM:
CT ANGIOGRAPHY CHEST WITH CONTRAST
TECHNIQUE: Multidetector CT imaging of the chest was performed using the
standard protocol during bolus administration of intravenous
contrast. Multiplanar CT image reconstructions and MIPs were
obtained to evaluate the vascular anatomy.

[Series 9: thins · axial · 0.59mm/px · z∈[+1042,+1318]mm · 15 of 432 slices shown]
[im 19/432  lung]
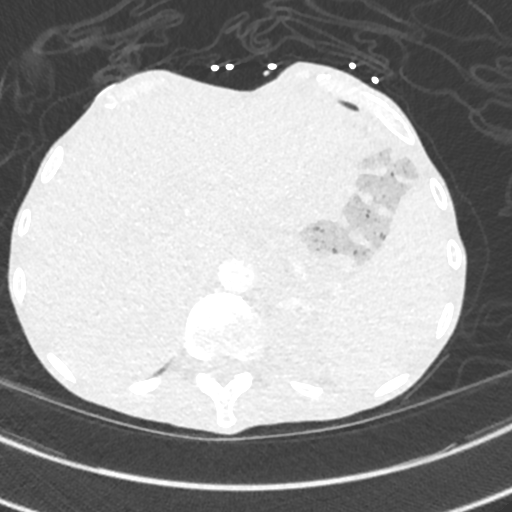
[im 57/432  soft-tissue]
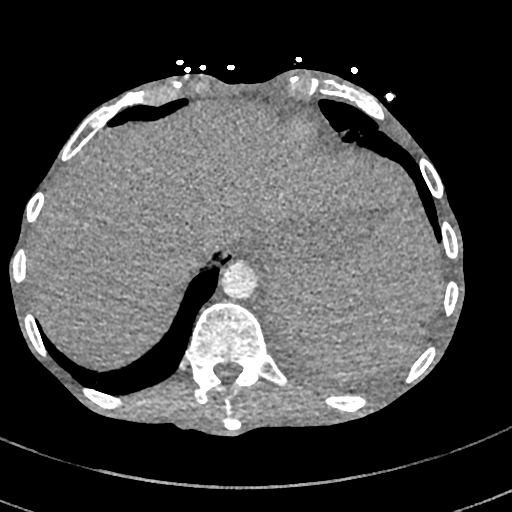
[im 75/432  lung]
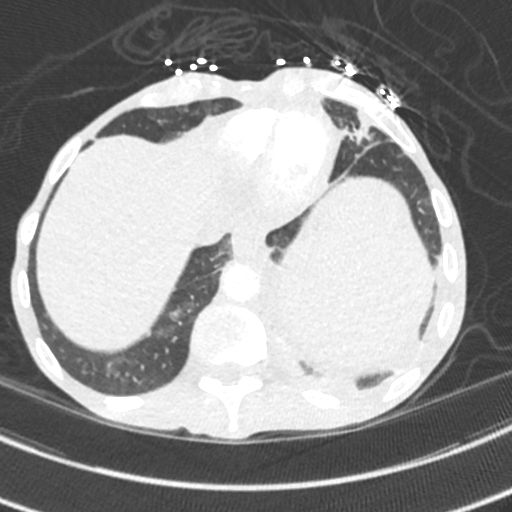
[im 113/432  soft-tissue]
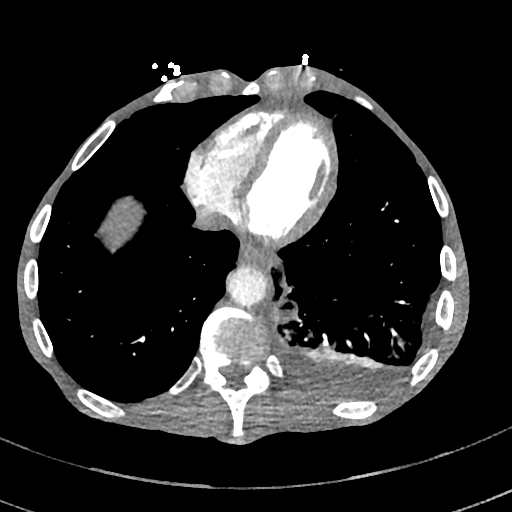
[im 132/432  lung]
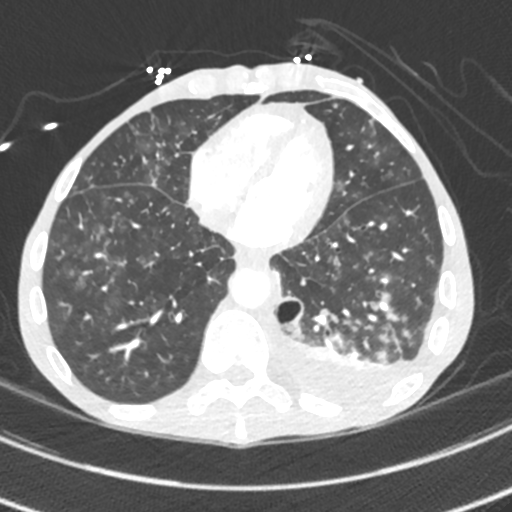
[im 169/432  soft-tissue]
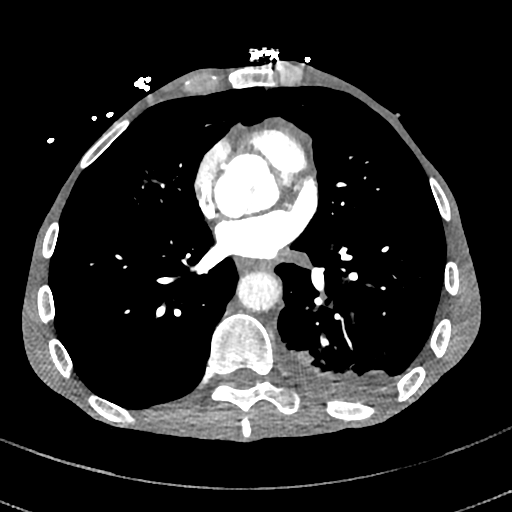
[im 188/432  lung]
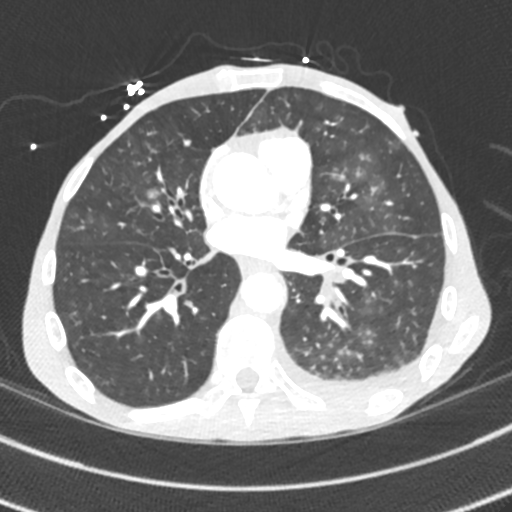
[im 225/432  soft-tissue]
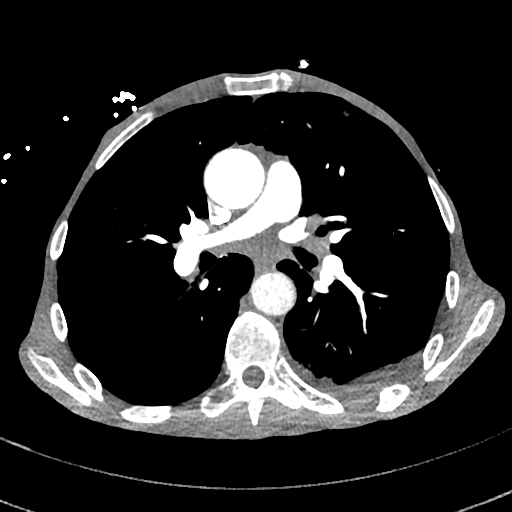
[im 244/432  lung]
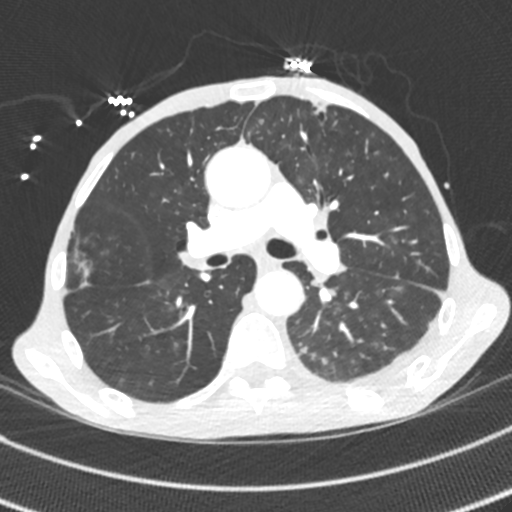
[im 263/432  soft-tissue]
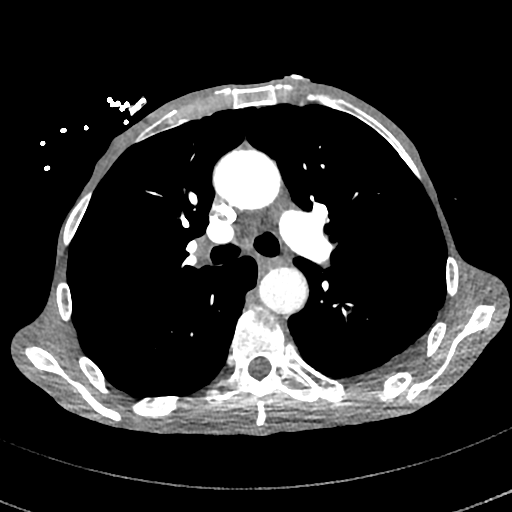
[im 300/432  lung]
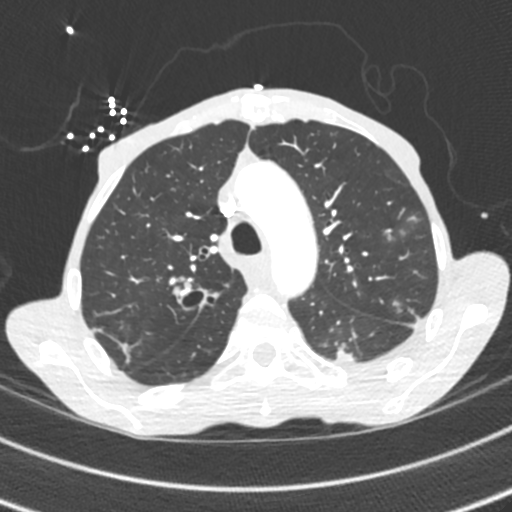
[im 319/432  soft-tissue]
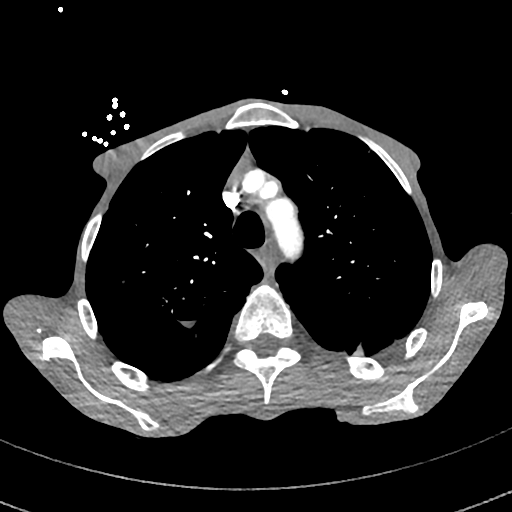
[im 357/432  lung]
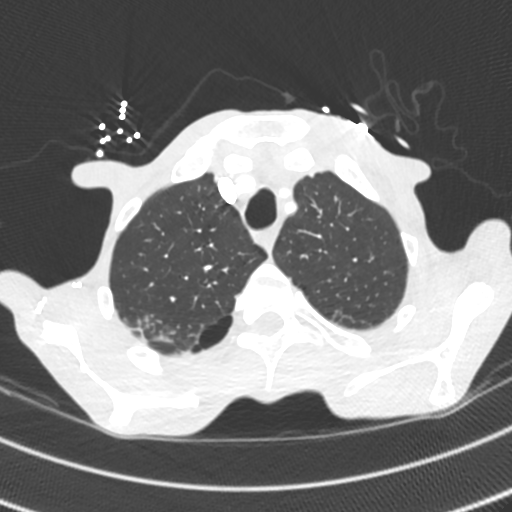
[im 375/432  soft-tissue]
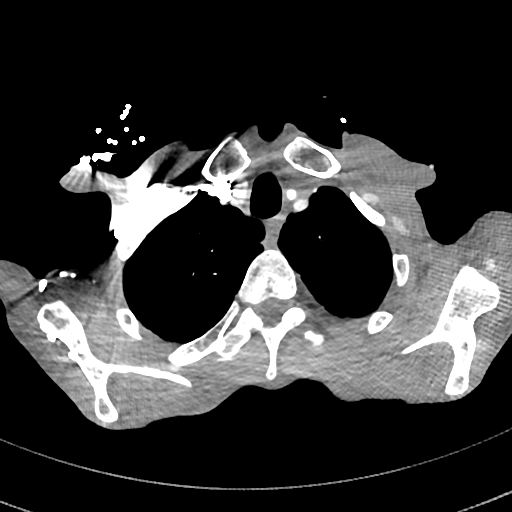
[im 413/432  lung]
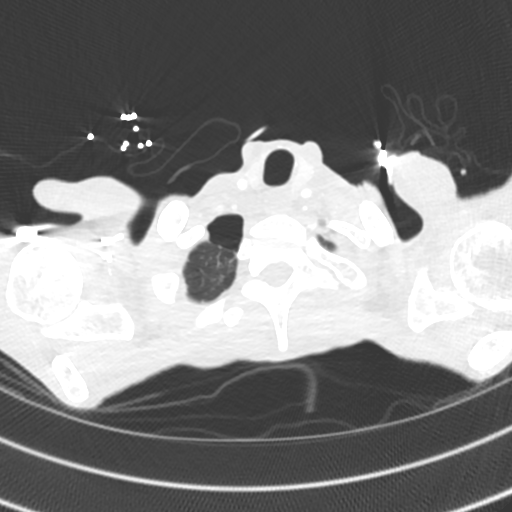

[Series 10: cor · coronal · 0.61mm/px · 3 of 114 slices shown]
[im 38/114  soft-tissue]
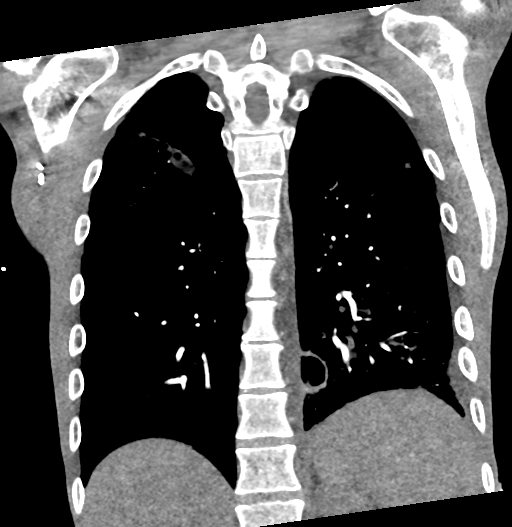
[im 51/114  soft-tissue]
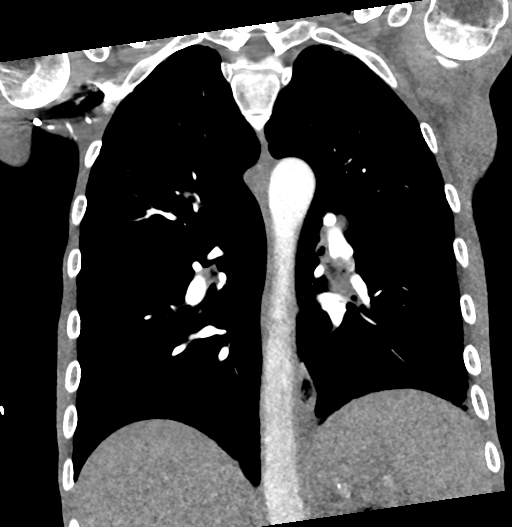
[im 63/114  soft-tissue]
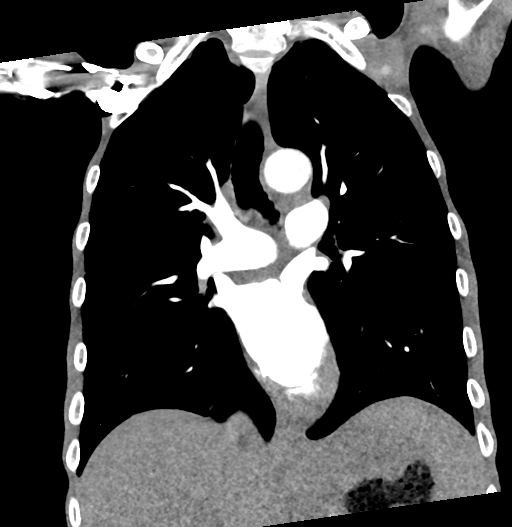

[18 of 46 positions shown; findings below may reference images not displayed]

RADIATION DOSE REDUCTION: This exam was performed according to the
departmental dose-optimization program which includes automated
exposure control, adjustment of the mA and/or kV according to
patient size and/or use of iterative reconstruction technique.

CONTRAST:  44mL OMNIPAQUE IOHEXOL 350 MG/ML SOLN
FINDINGS: Cardiovascular: Heart size is normal. Aorta and great vessel origins
are within normal limits.

Pulmonary artery opacification is excellent. No focal filling
defects are present to suggest pulmonary embolus.

Mediastinum/Nodes: No enlarged mediastinal, hilar, or axillary lymph
nodes. Thyroid gland, trachea, and esophagus demonstrate no
significant findings.

Lungs/Pleura: Extensive bilateral patchy airspace disease is
present. This most prominent the left lower lobe. A new cavitary
lesion is present a medial left lower lobe measuring 18 x 18 mm.
Moderate left pleural effusion is present with associated
atelectasis. Previously seen right middle lobe airspace disease has
cleared.

Cavitary lesion in the right upper lobe is now fluid-filled, overall
stable in size.

Upper Abdomen: Splenomegaly noted. Visualized upper abdomen is
otherwise within normal limits.

Musculoskeletal: No chest wall abnormality. No acute or significant
osseous findings.

Review of the MIP images confirms the above findings.
IMPRESSION: 1. No pulmonary embolus.
2. Extensive bilateral patchy airspace disease consistent with
multifocal pneumonia.
3. New cavitary lesion in the medial left lower lobe.
4. Moderate left pleural effusion with associated atelectasis.
5. Cavitary lesion in the right upper lobe is now fluid-filled,
overall stable in size.
6. Splenomegaly.

## 2023-06-14 IMAGING — US US CHEST/MEDIASTINUM
1 series · 3 of 3 positions shown · non-contrast
Comparison: CT 09/22/2021

CLINICAL DATA: Left pleural effusion.

EXAM:
CHEST ULTRASOUND

[Series 1: us thoracentesis asp pleural space w/img guide · 3 of 3 slices shown]
[im 1/3]
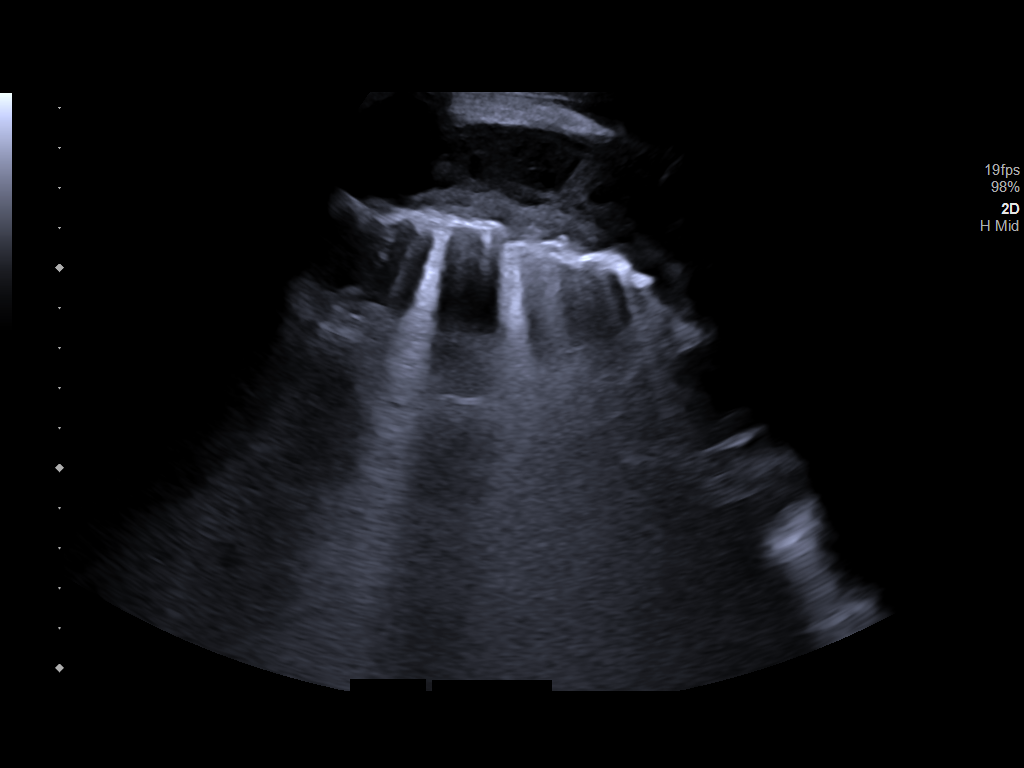
[im 2/3]
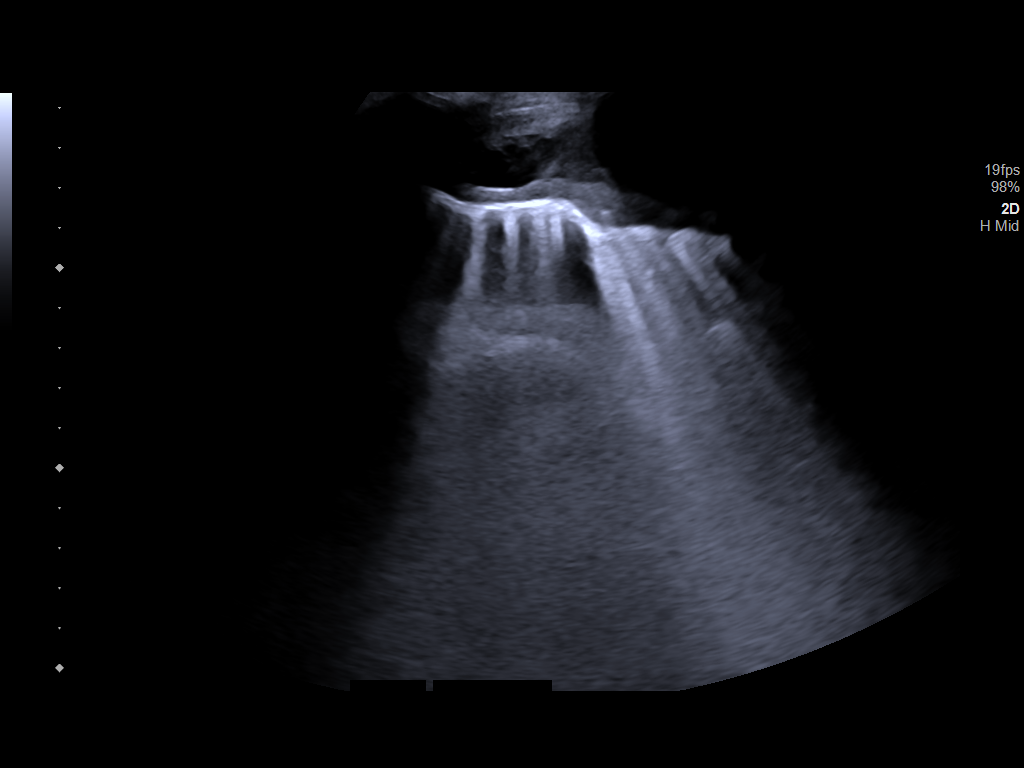
[im 3/3]
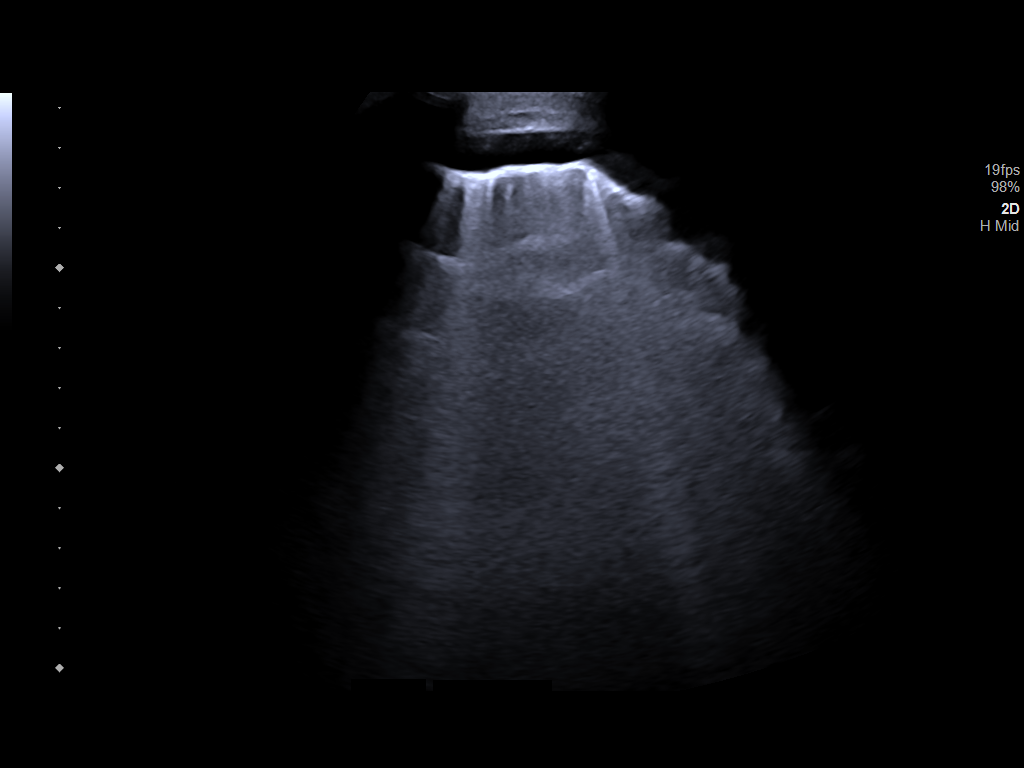

[3 of 3 positions shown; findings below may reference images not displayed]

FINDINGS: Images of the left lung base demonstrate a small amount of left
pleural fluid as seen on recent CT.
IMPRESSION: Small left pleural effusion.

## 2023-06-15 IMAGING — DX DG ABD PORTABLE 1V
1 series · 1 of 1 positions shown · non-contrast
Comparison: CTA chest 09/22/2021. CT chest, abdomen, and pelvis
01/21/2021.

CLINICAL DATA: Feeding tube placement.

EXAM:
PORTABLE ABDOMEN - 1 VIEW

[abdomen supine]
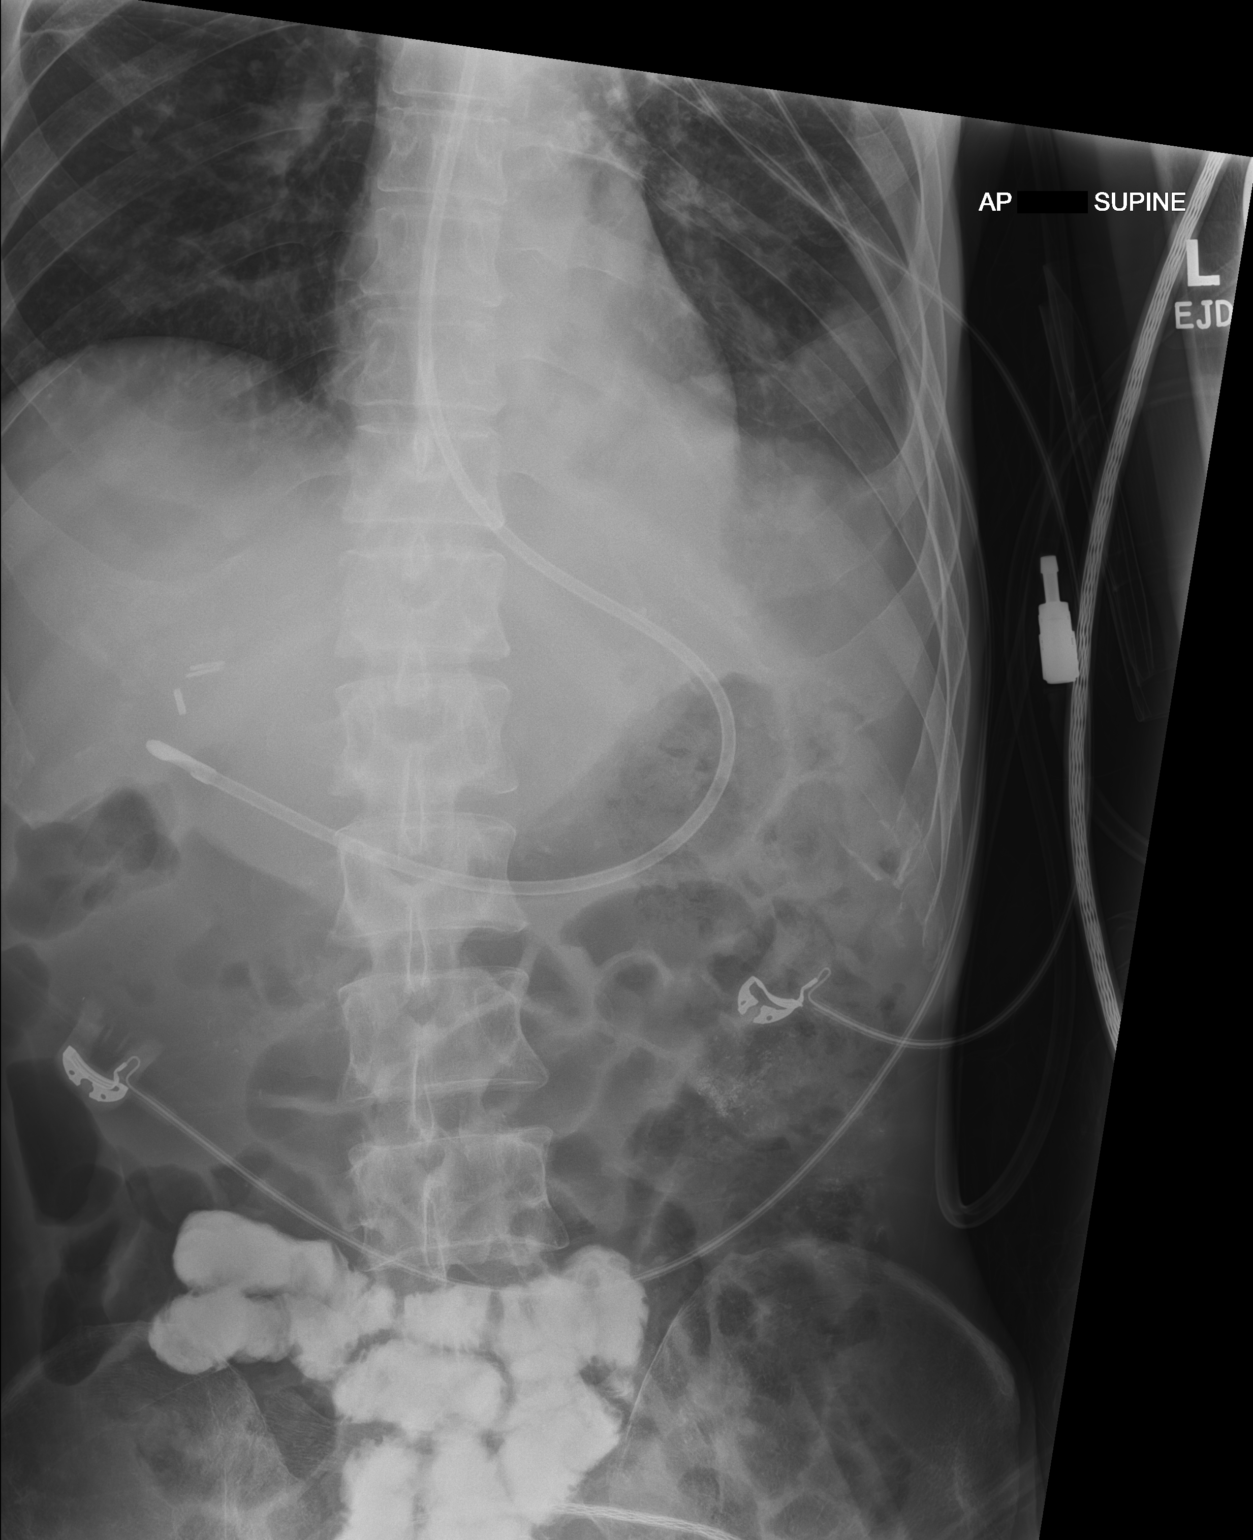

[1 of 1 positions shown; findings below may reference images not displayed]

FINDINGS: A feeding tube has been placed and terminates in the right upper
quadrant in the expected region of the duodenal bulb. Or contrast
material is noted in multiple lower abdominal small bowel loops. No
small bowel dilatation is seen to suggest obstruction. A small left
pleural effusion and bilateral reticulonodular lung opacities were
more fully evaluated on the recent chest CT. No acute osseous
abnormality is seen.
IMPRESSION: Feeding tube in the region of the duodenal bulb.

## 2023-06-17 IMAGING — DX DG CHEST 1V PORT
1 series · 1 of 1 positions shown · non-contrast
Comparison: September 22, 2021.

CLINICAL DATA: Pneumonia.

EXAM:
PORTABLE CHEST 1 VIEW

[chest ap]
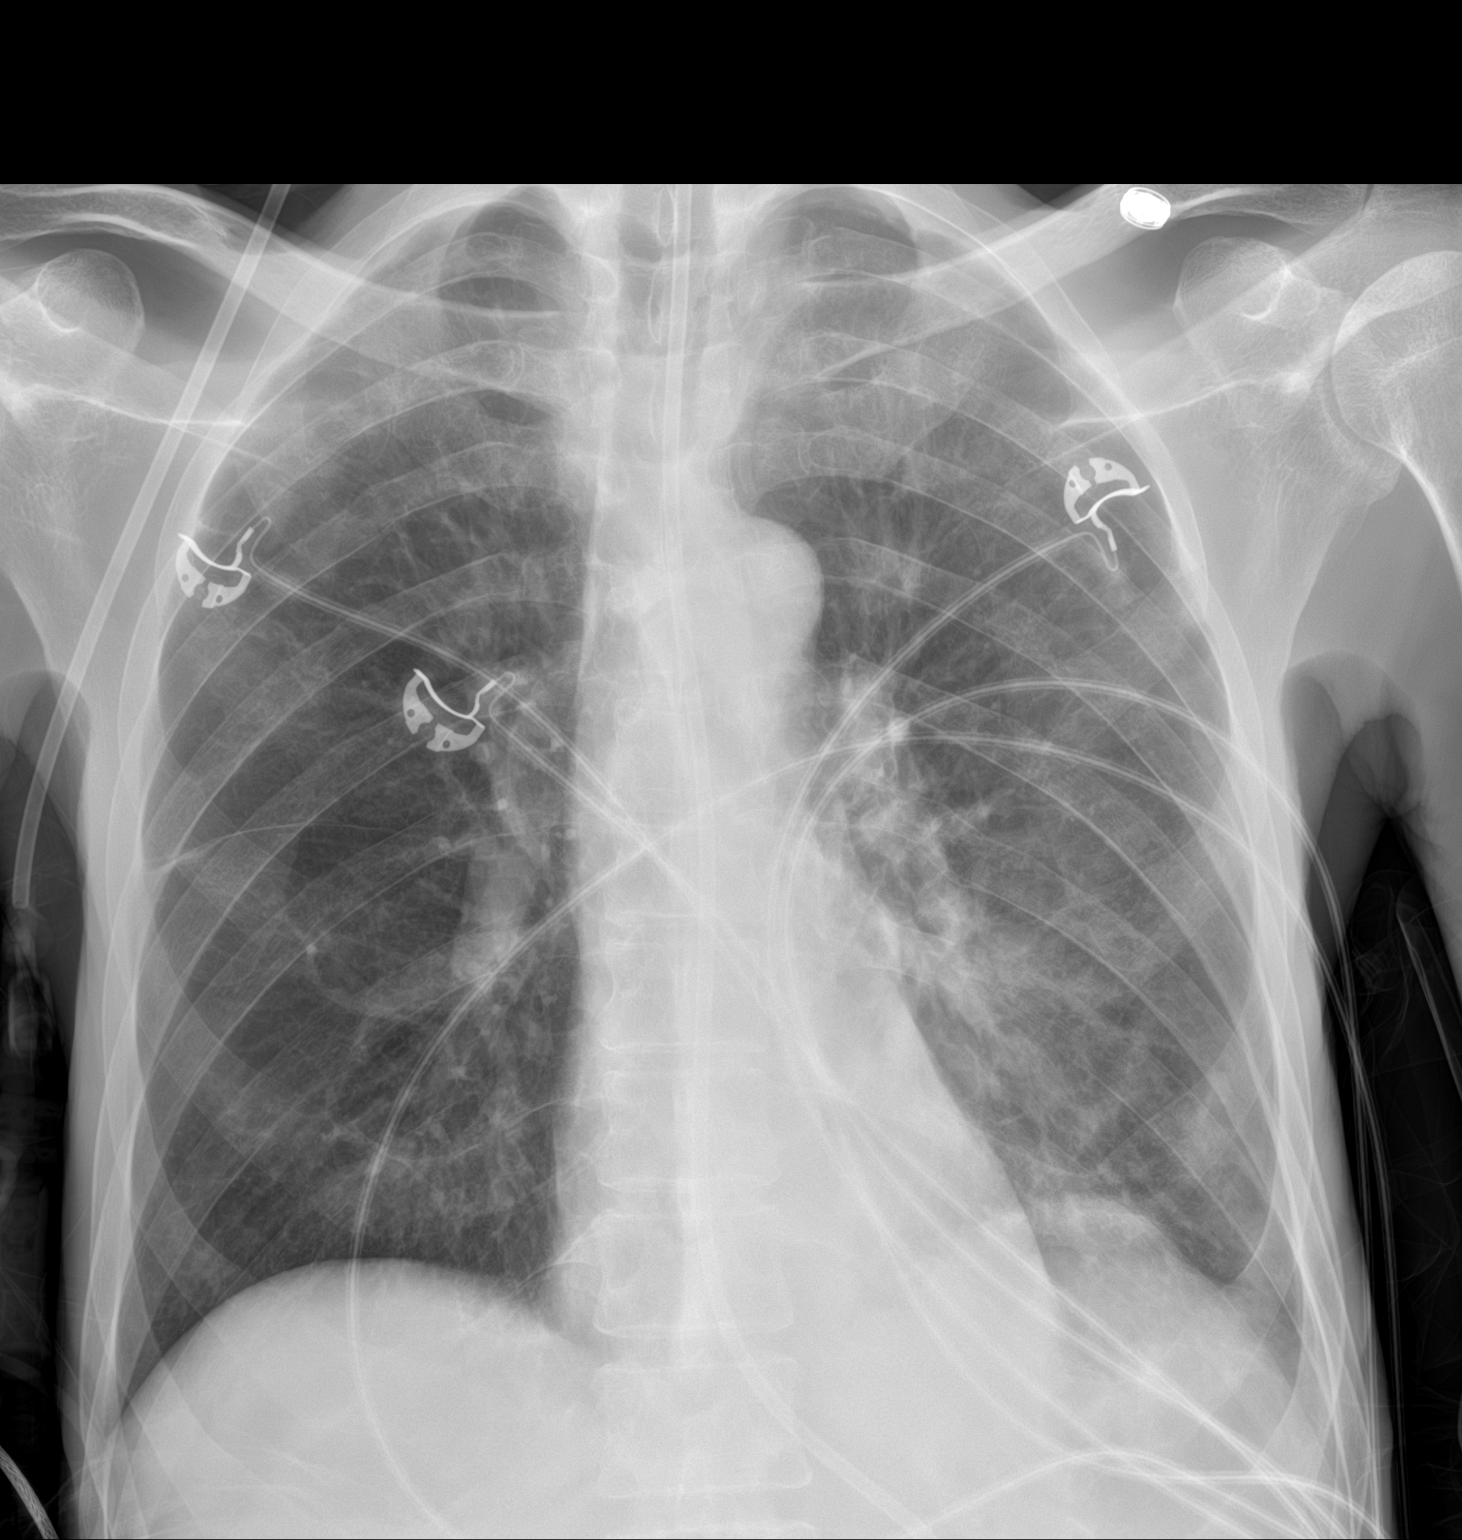

[1 of 1 positions shown; findings below may reference images not displayed]

FINDINGS: EKG leads project over the chest.

Feeding tube courses through in off the field of the radiograph.

Cardiomediastinal contours and hilar structures are stable. Hazy
interstitial prominence on the LEFT with subtle patchy airspace
disease is not substantially changed. No lobar consolidation. No
visible pneumothorax.

On limited assessment there is no acute skeletal finding.
IMPRESSION: Stable chest x-ray aside from interval placement of a feeding tube
which is off the field of view but extends into the upper abdomen.
Hazy interstitial prominence on the LEFT with subtle patchy airspace
disease.
